# Patient Record
Sex: Female | Born: 1980 | Race: White | Hispanic: No | Marital: Married | Smoking: Current some day smoker
Health system: Southern US, Community
[De-identification: ages and names within clinical notes are randomized; demographics above are authoritative.]

## PROBLEM LIST (undated history)

## (undated) DIAGNOSIS — F32A Depression, unspecified: Secondary | ICD-10-CM

## (undated) DIAGNOSIS — G43909 Migraine, unspecified, not intractable, without status migrainosus: Secondary | ICD-10-CM

## (undated) DIAGNOSIS — M549 Dorsalgia, unspecified: Secondary | ICD-10-CM

## (undated) DIAGNOSIS — F3160 Bipolar disorder, current episode mixed, unspecified: Secondary | ICD-10-CM

## (undated) DIAGNOSIS — F633 Trichotillomania: Secondary | ICD-10-CM

## (undated) DIAGNOSIS — R87613 High grade squamous intraepithelial lesion on cytologic smear of cervix (HGSIL): Secondary | ICD-10-CM

## (undated) DIAGNOSIS — M797 Fibromyalgia: Secondary | ICD-10-CM

## (undated) DIAGNOSIS — F329 Major depressive disorder, single episode, unspecified: Secondary | ICD-10-CM

## (undated) HISTORY — DX: Bipolar disorder, current episode mixed, unspecified: F31.60

## (undated) HISTORY — DX: Migraine, unspecified, not intractable, without status migrainosus: G43.909

## (undated) HISTORY — PX: BACK SURGERY: SHX140

## (undated) HISTORY — DX: High grade squamous intraepithelial lesion on cytologic smear of cervix (HGSIL): R87.613

## (undated) HISTORY — DX: Dorsalgia, unspecified: M54.9

## (undated) HISTORY — DX: Depression, unspecified: F32.A

## (undated) HISTORY — PX: TUBAL LIGATION: SHX77

## (undated) HISTORY — PX: CHOLECYSTECTOMY: SHX55

## (undated) HISTORY — DX: Fibromyalgia: M79.7

## (undated) HISTORY — DX: Major depressive disorder, single episode, unspecified: F32.9

---

## 2001-12-21 ENCOUNTER — Other Ambulatory Visit: Admission: RE | Admit: 2001-12-21 | Discharge: 2001-12-21 | Payer: Self-pay | Admitting: Internal Medicine

## 2003-02-16 ENCOUNTER — Ambulatory Visit (HOSPITAL_COMMUNITY): Admission: RE | Admit: 2003-02-16 | Discharge: 2003-02-16 | Payer: Self-pay | Admitting: *Deleted

## 2003-02-16 ENCOUNTER — Encounter: Payer: Self-pay | Admitting: *Deleted

## 2003-05-12 ENCOUNTER — Encounter: Payer: Self-pay | Admitting: *Deleted

## 2003-05-12 ENCOUNTER — Ambulatory Visit (HOSPITAL_COMMUNITY): Admission: RE | Admit: 2003-05-12 | Discharge: 2003-05-12 | Payer: Self-pay | Admitting: *Deleted

## 2003-07-07 ENCOUNTER — Ambulatory Visit (HOSPITAL_COMMUNITY): Admission: AD | Admit: 2003-07-07 | Discharge: 2003-07-07 | Payer: Self-pay | Admitting: *Deleted

## 2003-07-12 ENCOUNTER — Inpatient Hospital Stay (HOSPITAL_COMMUNITY): Admission: AD | Admit: 2003-07-12 | Discharge: 2003-07-14 | Payer: Self-pay | Admitting: *Deleted

## 2005-12-09 ENCOUNTER — Ambulatory Visit (HOSPITAL_COMMUNITY): Admission: RE | Admit: 2005-12-09 | Discharge: 2005-12-09 | Payer: Self-pay | Admitting: *Deleted

## 2006-02-11 ENCOUNTER — Ambulatory Visit (HOSPITAL_COMMUNITY): Admission: RE | Admit: 2006-02-11 | Discharge: 2006-02-11 | Payer: Self-pay | Admitting: *Deleted

## 2007-12-06 ENCOUNTER — Ambulatory Visit (HOSPITAL_COMMUNITY): Admission: RE | Admit: 2007-12-06 | Discharge: 2007-12-06 | Payer: Self-pay | Admitting: Family Medicine

## 2007-12-08 ENCOUNTER — Ambulatory Visit (HOSPITAL_COMMUNITY): Admission: RE | Admit: 2007-12-08 | Discharge: 2007-12-08 | Payer: Self-pay | Admitting: General Surgery

## 2007-12-08 ENCOUNTER — Encounter (INDEPENDENT_AMBULATORY_CARE_PROVIDER_SITE_OTHER): Payer: Self-pay | Admitting: General Surgery

## 2008-01-13 ENCOUNTER — Ambulatory Visit: Payer: Self-pay | Admitting: Gastroenterology

## 2010-01-30 ENCOUNTER — Other Ambulatory Visit: Admission: RE | Admit: 2010-01-30 | Discharge: 2010-01-30 | Payer: Self-pay | Admitting: Obstetrics and Gynecology

## 2010-08-25 ENCOUNTER — Inpatient Hospital Stay (HOSPITAL_COMMUNITY): Admission: AD | Admit: 2010-08-25 | Discharge: 2010-08-27 | Payer: Self-pay | Admitting: Obstetrics and Gynecology

## 2010-11-10 ENCOUNTER — Encounter: Payer: Self-pay | Admitting: Family Medicine

## 2010-12-31 LAB — CBC
HCT: 36.8 % (ref 36.0–46.0)
Hemoglobin: 12.4 g/dL (ref 12.0–15.0)
MCHC: 33.8 g/dL (ref 30.0–36.0)
RDW: 14.4 % (ref 11.5–15.5)
WBC: 8.6 10*3/uL (ref 4.0–10.5)

## 2010-12-31 LAB — RPR: RPR Ser Ql: NONREACTIVE

## 2011-03-04 NOTE — Op Note (Signed)
NAMECARMYN, [name] NO.:  [id number]   MEDICAL RECORD NO.:  [id number]          PATIENT TYPE:  AMB   LOCATION:  DAY                           FACILITY:  [facility]   PHYSICIAN:  Dalia Heading, M.D.  DATE OF BIRTH:  Apr 14, 1981   DATE OF PROCEDURE:  12/08/2007  DATE OF DISCHARGE:                               OPERATIVE REPORT   PREOPERATIVE DIAGNOSIS:  Cholecystitis, cholelithiasis, hepatitis.   POSTOPERATIVE DIAGNOSIS:  Cholecystitis, cholelithiasis, hepatitis.   PROCEDURE:  Laparoscopic cholecystectomy with cholangiograms, liver  biopsy.   SURGEON:  Dr. Franky Macho.   ANESTHESIA:  General endotracheal.   INDICATIONS:  The patient is a 30 year old white female who presents  with both cholecystitis secondary to cholelithiasis as well as newly  diagnosed hepatitis C positive antigen.  The patient now comes to the  operating for laparoscopic cholecystectomy with cholangiograms as well  as a liver biopsy.  The risks and benefits of the procedures including  bleeding, infection, hepatobiliary injury, and the possibility of an  open procedure were fully explained to the patient, who gave informed  consent.   PROCEDURE NOTE:  The patient was placed in supine position.  After  induction of general endotracheal anesthesia, the abdomen was prepped  and draped in the usual sterile technique with Betadine.  Surgical site  confirmation was performed.   An infraumbilical incision was made down to the fascia.  A Veress needle  was introduced into the abdominal cavity and confirmation of placement  was done using the saline drop test.  The abdomen was then insufflated  to 16 mmHg pressure.  An 11 mm trocar was introduced into the abdominal  cavity under direct visualization without difficulty.  The patient was  placed in reversed Trendelenburg position.  Additional 11-mm trocar was  placed in the epigastric region and 5-mm trocars placed in the right  upper quadrant and  right flank regions.  The liver was inspected, noted  to be within normal limits.  A Tru-Cut core needle biopsy of the right  lobe of liver was obtained and sent to pathology for further  examination.  The puncture site was cauterized using Bovie  electrocautery on the liver.  The gallbladder was retracted superior and  laterally.  The dissection was begun around the infundibulum of the  gallbladder.  The cystic duct was first identified.  Its juncture to the  infundibulum fully identified.  A single Endo clip was placed proximally  on the cystic duct.  Incision was made into the cystic duct and a  cholangiocatheter was inserted.  Under digital fluoroscopy,  cholangiograms were performed.  The hepatobiliary tree was within normal  limits.  No dilatation or intrahepatic defects were noted.  The dye  flowed freely into the duodenum.  The system was flushed with normal  saline.  The cholangiocatheter removed.  Multiple Endo clips placed  distally on the cystic duct and cystic duct was divided.  The cystic  artery was likewise ligated and divided.  Gallbladder was freed away  from the gallbladder fossa using Bovie electrocautery.  The gallbladder  delivered through the epigastric trocar site using EndoCatch bag.  Gallbladder fossa was inspected.  No abnormal bleeding or bile leakage  was noted.  Surgicel was placed in the gallbladder fossa.  All fluid and  air were then evacuated from the abdominal cavity prior to removal of  the trocars.   All wounds were irrigated normal saline.  All wounds were checked with  0.5% Sensorcaine.  The infraumbilical fascia was reapproximated using a  0-0 Vicryl interrupted suture.  All skin incisions were closed using  staples.  Betadine ointment and dry sterile dressings were applied.   All tape and needle counts were correct at the end of the procedure.  The patient was extubated in the operating room and went back to  recovery room awake in stable  condition.   COMPLICATIONS:  None.   SPECIMEN:  1. Gallbladder.  2. Liver biopsy.   BLOOD LOSS:  Minimal.      Dalia Heading, M.D.  Electronically Signed     MAJ/MEDQ  D:  12/08/2007  T:  12/09/2007  Job:  [id number]   cc:   Kirk Ruths, M.D.  Fax: [phone]

## 2011-03-04 NOTE — H&P (Signed)
NAMEGENEVIVE, PRINTUP NO.:  [id number]   MEDICAL RECORD NO.:  [id number]          PATIENT TYPE:  AMB   LOCATION:  DAY                           FACILITY:  [facility]   PHYSICIAN:  Dalia Heading, M.D.  DATE OF BIRTH:  [birth year]   DATE OF ADMISSION:  12/06/2007  DATE OF DISCHARGE:  LH                              HISTORY & PHYSICAL   CHIEF COMPLAINT:  Cholecystitis, cholelithiasis.   HISTORY OF PRESENT ILLNESS:  The patient is a 30 year old white female  who is referred for evaluation and treatment of biliary colic secondary  to cholelithiasis.  She has been having intermittent right upper  quadrant abdominal pain radiating to the flank, nausea, and indigestion  for several weeks.  This is made worse with fatty foods.  No fever,  chills, jaundice have been noted.   PAST MEDICAL HISTORY:  Unremarkable.   PAST SURGICAL HISTORY:  Unremarkable.   CURRENT MEDICATIONS:  None.   ALLERGIES:  No known drug allergies.   REVIEW OF SYSTEMS:  Noncontributory.   PHYSICAL EXAMINATION:  GENERAL:  The patient is a well-developed, well-  nourished white female in no acute distress.  HEENT:  Examination reveals no scleral icterus.  LUNGS:  Clear to auscultation with equal breath sounds bilaterally.  HEART:  Examination reveals regular rate and rhythm without S3, S4, or  murmurs.  ABDOMEN:  Soft and nondistended.  She is tender in the right upper  quadrant to palpation.  No hepatosplenomegaly, masses, hernias are  identified.   Ultrasound of the gallbladder reveals cholelithiasis.   LFTs are abnormal with an alkaline phosphatase of 219, SGOT 104, SGPT  115, total bilirubin 0.6.   IMPRESSION:  Cholecystitis, cholelithiasis.   PLAN:  The patient is scheduled for laparoscopic cholecystectomy with  cholangiograms on December 08, 2007.  Risks and benefits of the  procedure including bleeding, infection, hepatobiliary injury, the  possibly of an open procedure were fully  explained to the patient, gave  informed consent.      Dalia Heading, M.D.  Electronically Signed     MAJ/MEDQ  D:  12/07/2007  T:  12/07/2007  Job:  [id number]   cc:   [facility]  Fax: [phone]   Kirk Ruths, M.D.  Fax: [phone]

## 2011-03-07 NOTE — Discharge Summary (Signed)
   [name]                         ACCOUNT NO.:  [id number]   MEDICAL RECORD NO.:  [id number]                   PATIENT TYPE:  INP   LOCATION:  A428                                 FACILITY:  [facility]   PHYSICIAN:  Langley Gauss, M.D.                DATE OF BIRTH:  Feb 03, 1981   DATE OF ADMISSION:  07/12/2003  DATE OF DISCHARGE:  07/14/2003                                 DISCHARGE SUMMARY   DIAGNOSES:  Term pregnancy, delivered.   PROCEDURES:  On July 12, 2003:  1. Placement of continuous lumbar epidural analgesia.  2. Vaginal delivery.   DISPOSITION:  At the time of discharge the patient is given Depo-Provera 250  mg IM.  She also is given a prescription for Tylenol No. 3 with no refill.  She is breast feeding.  She will follow up in the office in 4-6 weeks time  for postpartum evaluation.   PERTINENT LABORATORY STUDIES:  Include O positive blood type.  Admission  hemoglobin and hematocrit 11.4/33.6 with a white count of 9.1.  Postpartum  day 1 hemoglobin 10.8/hematocrit 32.2.   HOSPITAL COURSE:  See previous dictations.  The patient delivered in a well-  controlled manner on July 12, 2003.  Postpartum the patient did well,  bonded well with the infant.  With the assistance of the nursing staff the  infant did begin latching on and breast feeding well.  Thus, both mother and  infant were discharged to home on postpartum day 1-1/2.     ___________________________________________                                         Langley Gauss, M.D.   DC/MEDQ  D:  07/18/2003  T:  07/18/2003  Job:  [id number]

## 2011-03-07 NOTE — Op Note (Signed)
[name]                         ACCOUNT NO.:  [id number]   MEDICAL RECORD NO.:  [id number]                   PATIENT TYPE:  INP   LOCATION:  A428                                 FACILITY:  [facility]   PHYSICIAN:  Langley Gauss, M.D.                DATE OF BIRTH:  14-Nov-1980   DATE OF PROCEDURE:  07/12/2003  DATE OF DISCHARGE:                                 OPERATIVE REPORT   PREOPERATIVE DIAGNOSIS:  A 39-week intrauterine pregnancy for induction.   PROCEDURE:  1. Placement of continuous lumbar epidural analgesia.  2. Spontaneous-assisted vaginal delivery of a 6 pound 12 ounce female     infant.  3. Midline episiotomy repair.   SURGEON:  Langley Gauss, M.D.   ESTIMATED BLOOD LOSS:  Less than 500 cc.   COMPLICATIONS:  None.   SPECIMENS:  Arterial cord gas and cord blood to pathology.  The placenta was  examined and noted to be apparently intact with a three-vessel umbilical  cord.   SUMMARY:  The patient progressed rapidly along the labor curve, with an  excellent epidural block in place.  With reaching complete dilatation, she  began having pelvic pressure and was encouraged to push.  She was placed in  the dorsal lithotomy position and prepped and draped in the usual sterile  manner.  The Foley catheter was removed.  The patient pushed well during the  short second stage of labor with descent of the vertex to the perineal  floor.  A total of 30 cc of 1% lidocaine plain was injected in the midline  of the perineal body.  With extension of the perineum, a small midline  episiotomy was performed.  The infant was then delivered in a direct OA  position over this midline episiotomy without extension.  Mouth and nares  were bulb suctioned of clear amniotic fluid.  Renewed expulsive efforts  resulted in spontaneous rotation to a right anterior shoulder position.  Gentle downward traction combined with expulsive efforts resulted in  delivery of the shoulder beneath  the pubic symphysis without difficulty.  Spontaneous and vigorous breathe and cry was noted.  The umbilical cord was  milked towards the infant.  The cord was doubly clamped and cut.  The infant  was placed on the maternal abdomen for immediate bonding purposes.  Arterial  cord gas and cord blood were then obtained from the umbilical cord.  Gentle  traction on the umbilical cord resulted in separation which upon examination  appeared to be an intact placenta with associated three-vessel umbilical  cord.  Uterine tone was adequate by performing bimanual fundal massage.  Examination of the genital tract revealed no lacerations.  The midline  episiotomy was not extended.  This was easily repaired utilizing 0 chromic  in a running locked fashion on the vaginal mucosa followed by a continuous  layer of 2-0 Vicryl on the muscular layer of the perineal  body followed by  continuous superficial 0 chromic running suture on the perineal body itself.   The patient tolerated the delivery very well.  She was taken out of the  dorsal lithotomy position.  After bonding with her infant, she s rolled to  her side at which time the epidural catheter was removed with the blue tip  noted to be intact.      ___________________________________________                                            Langley Gauss, M.D.   DC/MEDQ  D:  07/12/2003  T:  07/13/2003  Job:  [id number]

## 2011-03-07 NOTE — Group Therapy Note (Signed)
   NAME:  [name]                         ACCOUNT NO.:  [id number]   MEDICAL RECORD NO.:  [id number]                   PATIENT TYPE:  INP   LOCATION:  LDR1                                 FACILITY:  [facility]   PHYSICIAN:  Langley Gauss, M.D.                DATE OF BIRTH:  [birth year]   DATE OF PROCEDURE:  07/12/2003  DATE OF DISCHARGE:                                   PROGRESS NOTE   HISTORY OF PRESENT ILLNESS:  The patient was admitted for induction of  labor.  External fetal monitor revealed a reassuring fetal heart rate, with  fetal heart rate baseline of 150s.  No decelerations were noted,  accelerations were noted.  There was normal longterm variability.  No  uterine activity was identified.  The patient did state that she had some  mild uterine contractions after an examination yesterday in the office, but  other than that, no significant change in cervical mucous, no leakage of  fluid, no vaginal discharge or bleeding.   On examination, the cervix was 2 cm dilated, 70% effaced, with the vertex at  0 station.  The cervix was somewhat posterior.  A fetal scalp electrode was  placed with resultant amniotomy and the findings of clear amniotic fluid.   ASSESSMENT AND PLAN:  The patient is GBS negative.  She would desire  epidural with onset of active labor.  Will manage expectantly x1 hour for  onset of labor.  Thereafter, augment or induce with Pitocin as clinically  indicated.      ___________________________________________                                            Langley Gauss, M.D.   DC/MEDQ  D:  07/12/2003  T:  07/12/2003  Job:  [id number]

## 2011-03-07 NOTE — H&P (Signed)
NAME:  [name]                         ACCOUNT NO.:  [id number]   MEDICAL RECORD NO.:  [id number]                   PATIENT TYPE:  INP   LOCATION:  LDR1                                 FACILITY:  [facility]   PHYSICIAN:  Langley Gauss, M.D.                DATE OF BIRTH:  10-21-1980   DATE OF ADMISSION:  07/12/2003  DATE OF DISCHARGE:                                HISTORY & PHYSICAL   This is a 30 year old gravida 1, para 0 at 39+ weeks gestation who is  admitted for induction of labor, secondary to psychosocial factors.  In  addition, the patient has been having a prodromal symptoms of labor x 2 days  duration.  The patient's prenatal course uncomplicated.  She does have a one  hour GTT normal at 85.  She is noted to be O positive blood type.  Negative  antibody screen.  AFP triple screen within normal limits.  Group B strep  carrier status noted to be negative, tested June 22, 2003.  The patient  has had an 18-week ultrasound which documents a normal anatomic survey.  She  has displayed good growth of her fundal height.  She has experienced a  weight gain from 122-167, during the pregnancy.   PAST MEDICAL HISTORY:  She has no known drug allergies.   CURRENT MEDICATIONS:  Prenatal vitamins.   The patient quit smoking with the positive pregnancy test.   She has no other medical or surgical history.  She did receive Depo-Provera  11/2001.  However, her menstrual periods had become regular and predictable  by May 27, 2002.   SOCIAL HISTORY:  The father of the baby works for [employer].  She  previously was employed at [employer].  She ceased employment during this  pregnancy.   PHYSICAL EXAMINATION:  GENERAL:  A white female in no acute distress.  VITAL SIGNS:  Height 5 foot 5, pre-pregnancy 122, most recent 167, 126/84,  pulse rate of 80, respiratory rate is 20.  HEENT:  Negative.  No adenopathy.  NECK:  Supple.  Thyroid is not palpable.  The mucous  membranes are moist.  LUNGS:  Clear.  CARDIOVASCULAR:  Regular, rate and rhythm.  ABDOMEN:  Soft and nontender.  No surgical scars are identified.  She is  vertex presentation by Leopold's maneuvers.  EXTREMITIES:  Noted to be normal.  PELVIC:  Normal external genitalia.  No lesions or ulcerations identified.  Cervix noted to be 2-cm dilated, 80% effaced, vertex at a 0 station.  Cervix  is noted to be posterior.  Vertex is well applied to the cervix with no  palpable membranes.  Fetal heart tones are auscultated in the 150s.  Fundal  height is 35-cm.  The patient did experience engagement of the vertex  sensitive pelvis during this past week.   ASSESSMENT:  A 39+ week intrauterine pregnancy with favorable cervix.   The patient  desires induction at this time.  We will proceed with amniotomy  and augment her induce as clinically indicated.  The patient does desire  epidural for relief with onset of active labor.     ___________________________________________                                         Langley Gauss, M.D.   DC/MEDQ  D:  07/11/2003  T:  07/11/2003  Job:  [id number]

## 2011-03-07 NOTE — Op Note (Signed)
[name]                         ACCOUNT NO.:  [id number]   MEDICAL RECORD NO.:  [id number]                   PATIENT TYPE:  INP   LOCATION:  A428                                 FACILITY:  [facility]   PHYSICIAN:  Langley Gauss, M.D.                DATE OF BIRTH:  03/23/1981   DATE OF PROCEDURE:  07/12/2003  DATE OF DISCHARGE:                                 OPERATIVE REPORT   PROCEDURE:  Placement of continuous lumbar epidural analgesia at the L2-L3  interspace.   SURGEON:  Langley Gauss, M.D.   DESCRIPTION OF PROCEDURE:  Appropriate informed consent was obtained.  Continuous electronic fetal monitoring revealed a reassuring fetal heart  rate.  The patient was placed in the seated position.  Bony landmarks were  identified.  The patient's back was sterilely prepped and draped utilizing  the epidural kit.  The L2-L3 interspace was chosen, and 5 cc of 1% lidocaine  plain was injected in the midline to raise a small skin wheal.  The 17-gauge  Tuohy-Schliff needle was then utilized with loss of resistance and air  filled glass syringe to identify entry into the epidural space.  On the  first attempt, the bony spinous process was encountered, so the epidural  needle was withdrawn and directed slightly caudad.  With the second attempt,  there was excellent loss of resistance consistent with entry into the  epidural space.  Initial test dose of 5 cc of 1.5% lidocaine plus  epinephrine was injected through the epidural needle.  No signs of CSF,  intravascular injection, or pain.  The epidural catheter was inserted to a  depth of 5 cm.  The epidural needle was removed.  Aspiration test was  negative.  A second test dose of 2 cc of 1.5% lidocaine plus epinephrine was  injected through the epidural catheter.  Again, no signs of  CSF,  intravascular injection, or pain.  The catheter was secured into place.  The  patient was returned to the lateral supine position at which time she  had  tingling in the feet bilateral consistent with an appropriate epidural  block.  She was then started on the infusion pump containing the standard  mixture.  She will be treated with a bolus of 10 cc followed by a continuous  infusion rate of 14 cc per hour.   At the completion of the procedure, the patient did have evidence of an  adequate block.  She was examined and noted to be 6 cm dilated with  continued reassuring fetal heart rate.      ___________________________________________                                            Langley Gauss, M.D.   DC/MEDQ  D:  07/12/2003  T:  07/13/2003  Job:  [id number]

## 2011-04-30 ENCOUNTER — Other Ambulatory Visit: Payer: Self-pay | Admitting: Dermatology

## 2011-06-05 ENCOUNTER — Other Ambulatory Visit: Payer: Self-pay | Admitting: Dermatology

## 2011-07-11 LAB — HCG, QUANTITATIVE, PREGNANCY: hCG, Beta Chain, Quant, S: <2

## 2011-10-21 DIAGNOSIS — R87613 High grade squamous intraepithelial lesion on cytologic smear of cervix (HGSIL): Secondary | ICD-10-CM

## 2011-10-21 HISTORY — DX: High grade squamous intraepithelial lesion on cytologic smear of cervix (HGSIL): R87.613

## 2012-08-17 ENCOUNTER — Ambulatory Visit (HOSPITAL_COMMUNITY)
Admission: RE | Admit: 2012-08-17 | Discharge: 2012-08-17 | Disposition: A | Discharge status: Home / Self Care | Payer: 59 | Source: Ambulatory Visit | Attending: Family Medicine | Admitting: Family Medicine

## 2012-08-17 ENCOUNTER — Encounter: Payer: Self-pay | Admitting: Family Medicine

## 2012-08-17 ENCOUNTER — Ambulatory Visit (INDEPENDENT_AMBULATORY_CARE_PROVIDER_SITE_OTHER): Payer: 59 | Admitting: Family Medicine

## 2012-08-17 VITALS — BP 100/70 | HR 98 | Resp 15 | Ht 64.5 in | Wt 130.4 lb

## 2012-08-17 DIAGNOSIS — M549 Dorsalgia, unspecified: Secondary | ICD-10-CM

## 2012-08-17 DIAGNOSIS — F32A Depression, unspecified: Secondary | ICD-10-CM

## 2012-08-17 DIAGNOSIS — F329 Major depressive disorder, single episode, unspecified: Secondary | ICD-10-CM

## 2012-08-17 DIAGNOSIS — M545 Low back pain, unspecified: Secondary | ICD-10-CM | POA: Insufficient documentation

## 2012-08-17 DIAGNOSIS — M5126 Other intervertebral disc displacement, lumbar region: Secondary | ICD-10-CM | POA: Insufficient documentation

## 2012-08-17 DIAGNOSIS — G8929 Other chronic pain: Secondary | ICD-10-CM

## 2012-08-17 DIAGNOSIS — F3289 Other specified depressive episodes: Secondary | ICD-10-CM

## 2012-08-17 DIAGNOSIS — M546 Pain in thoracic spine: Secondary | ICD-10-CM | POA: Insufficient documentation

## 2012-08-17 DIAGNOSIS — M5136 Other intervertebral disc degeneration, lumbar region: Secondary | ICD-10-CM | POA: Insufficient documentation

## 2012-08-17 MED ORDER — HYDROCODONE-ACETAMINOPHEN 5-500 MG PO TABS: 1.0000 | ORAL_TABLET | ORAL | Status: DC | PRN

## 2012-08-17 MED ORDER — KETOROLAC TROMETHAMINE 60 MG/2ML IJ SOLN
60.0000 mg | Freq: Once | INTRAMUSCULAR | Status: AC
  Administered 2012-08-17: 60 mg via INTRAMUSCULAR

## 2012-08-17 MED ORDER — METHYLPREDNISOLONE 4 MG PO KIT: PACK | ORAL | Status: DC

## 2012-08-17 NOTE — Patient Instructions (Signed)
Xrays of back to be done Start prednisone dose pak  Vicodin for severe pain Soma at bedtime  MRI will be set up as well  Records to be obtained  F/U 6 months for mood

## 2012-08-17 NOTE — Assessment & Plan Note (Signed)
xrays to be done, concern for nerve impingement with radicular symptoms, which have become more debilitating. She has been on NSAIDS, muscle relaxant, add low dose narcotic Xrays show loss of disc height and narrowing, early degenerative changes MRI to be done

## 2012-08-17 NOTE — Progress Notes (Signed)
  Subjective:    Patient ID: [name], female    DOB: Oct 22, 1980, 31 y.o.   MRN: [id number]  HPI Patient here to establish care. Previous PCP Dr. Lysbeth Galas. [facility] Medications and history reviewed Back pain-patient's history of chronic back pain since 2007 after birth of her child. She's had acute flares on and off for the past few years. She never had imaging secondary to insurance coverage. Over the past few months her back is worsened to the point where she is unable to stand or sit for long periods of time. She often has to walk hunched over to relieve pain. She was evaluated by her previous PCP recently who started her on soma muscle relaxant as well as Aleve neither has helped very much. Back pain described as sharp pain in the mid to lower back with radiation down bilateral legs. She has more pain on the left side compared to right. She denies tingling or numbness in her feet. Denies any change in bowel or bladder. Depression-patient has a long-standing history of depression and anxiety. She's been on medications for many years she's currently stable on Prozac and Wellbutrin.    Review of Systems  GEN- denies fatigue, fever, weight loss,weakness, recent illness HEENT- denies eye drainage, change in vision, nasal discharge, CVS- denies chest pain, palpitations RESP- denies SOB, cough, wheeze ABD- denies N/V, change in stools, abd pain GU- denies dysuria, hematuria, dribbling, incontinence MSK- + joint pain, muscle aches, injury Neuro- denies headache, dizziness, syncope, seizure activity      Objective:   Physical Exam GEN- NAD, alert and oriented x3, uncomfortable appearing HEENT- PERRL, EOMI, non injected sclera, pink conjunctiva, MMM, oropharynx clear Neck- Supple,normal ROM CVS- RRR, no murmur RESP-CTAB ABD-NABS,soft,NT,ND Back- TTP lumbar spine, minimal spasm, able to walk on toes and heels, decreased ROM, + SLR left side Neuro-  CNII-XII in tact, bilateral Reflexes 2 + equal, tone normal, sensation grossly in tact , antalgic gait EXT- No edema Pulses- Radial, DP- 2+ Psych-normal affect and Mood       Assessment & Plan:

## 2012-08-17 NOTE — Assessment & Plan Note (Signed)
Controlled on medications Records to be obtained

## 2012-08-23 ENCOUNTER — Ambulatory Visit (HOSPITAL_COMMUNITY)
Admission: RE | Admit: 2012-08-23 | Discharge: 2012-08-23 | Disposition: A | Discharge status: Home / Self Care | Payer: 59 | Source: Ambulatory Visit | Attending: Family Medicine | Admitting: Family Medicine

## 2012-08-23 DIAGNOSIS — M5126 Other intervertebral disc displacement, lumbar region: Secondary | ICD-10-CM | POA: Insufficient documentation

## 2012-08-23 DIAGNOSIS — M545 Low back pain, unspecified: Secondary | ICD-10-CM | POA: Insufficient documentation

## 2012-08-23 DIAGNOSIS — M549 Dorsalgia, unspecified: Secondary | ICD-10-CM

## 2012-09-30 ENCOUNTER — Ambulatory Visit (INDEPENDENT_AMBULATORY_CARE_PROVIDER_SITE_OTHER): Payer: 59 | Admitting: Family Medicine

## 2012-09-30 ENCOUNTER — Encounter: Payer: Self-pay | Admitting: Family Medicine

## 2012-09-30 VITALS — BP 93/71 | HR 85 | Resp 16 | Ht 64.5 in | Wt 128.1 lb

## 2012-09-30 DIAGNOSIS — N3090 Cystitis, unspecified without hematuria: Secondary | ICD-10-CM

## 2012-09-30 DIAGNOSIS — N390 Urinary tract infection, site not specified: Secondary | ICD-10-CM

## 2012-09-30 LAB — POCT URINALYSIS DIPSTICK
Glucose, UA: NEGATIVE
Nitrite, UA: NEGATIVE
Urobilinogen, UA: 0.2
pH, UA: 5.5

## 2012-09-30 MED ORDER — CEPHALEXIN 500 MG PO CAPS: 500.0000 mg | ORAL_CAPSULE | Freq: Two times a day (BID) | ORAL | Status: AC

## 2012-09-30 MED ORDER — PHENAZOPYRIDINE HCL 100 MG PO TABS: 100.0000 mg | ORAL_TABLET | Freq: Three times a day (TID) | ORAL | Status: DC | PRN

## 2012-09-30 NOTE — Assessment & Plan Note (Signed)
Will treat for acute cystitis with keflex, urine sent for culture to see what kind of pathogen we are dealing with, she may have only halfway treated previous UTI with the cirpo, though recurrent monthly symptoms are concerning. ? Interstitial cystitis picture. Discussed limiting caffiene, increase water, add pyridium

## 2012-09-30 NOTE — Patient Instructions (Signed)
Antibiotics sent We will notify you if antibiotics need to be changed Pyridium for pressure and pain for 3 days F/U as needed

## 2012-09-30 NOTE — Progress Notes (Signed)
  Subjective:    Patient ID: [name], female    DOB: 25-May-1981, 31 y.o.   MRN: [id number]  HPI  Pt here with UTI symptoms past past 3 days.  Feels like she can't empty bladder completely +dysuria + urinary frequency,  + hematuria at end of urine stream,. Dysuria associated with sharp pains in left side. Has symptoms of UTI about 2-3 times a month, but rarely seen in office for them, sister also has frequent UTI, has mild stress incontinence since birth of children . Denies fever, constipation, N/V Note last month took 5 days of left over cipro   Review of Systems - per above   GEN- denies fatigue, fever, weight loss,weakness, recent illness CVS- denies chest pain, palpitations RESP- denies SOB, cough, wheeze ABD- denies N/V, change in stools, abd pain GU- + dysuria,+ hematuria, dribbling, incontinence, denies vaginal discharge        Objective:   Physical Exam GEN- NAD, alert and oriented x3 CVS- RRR, no murmur RESP-CTAB ABD-NABS,soft,NT,ND, no suprapubic tenderness, no CVA tenderness EXT- No edema Pulses- Radial 2+        Assessment & Plan:

## 2012-11-15 ENCOUNTER — Ambulatory Visit (INDEPENDENT_AMBULATORY_CARE_PROVIDER_SITE_OTHER): Payer: 59 | Admitting: Family Medicine

## 2012-11-15 ENCOUNTER — Encounter: Payer: Self-pay | Admitting: Family Medicine

## 2012-11-15 VITALS — BP 90/68 | HR 96 | Temp 99.3°F | Resp 18 | Ht 64.5 in | Wt 126.0 lb

## 2012-11-15 DIAGNOSIS — K2970 Gastritis, unspecified, without bleeding: Secondary | ICD-10-CM | POA: Insufficient documentation

## 2012-11-15 DIAGNOSIS — K5289 Other specified noninfective gastroenteritis and colitis: Secondary | ICD-10-CM

## 2012-11-15 DIAGNOSIS — K529 Noninfective gastroenteritis and colitis, unspecified: Secondary | ICD-10-CM

## 2012-11-15 MED ORDER — PROMETHAZINE HCL 12.5 MG PO TABS: 12.5000 mg | ORAL_TABLET | Freq: Four times a day (QID) | ORAL | Status: DC | PRN

## 2012-11-15 MED ORDER — DICYCLOMINE HCL 10 MG PO CAPS: 10.0000 mg | ORAL_CAPSULE | Freq: Four times a day (QID) | ORAL | Status: DC

## 2012-11-15 NOTE — Progress Notes (Signed)
  Subjective:    Patient ID: [name], female    DOB: 26-Jan-1981, 32 y.o.   MRN: [id number]  HPI  Patient presents with three-day history of diarrhea and nausea. She also noted fever last night. Saturday evening she began to feel sick and when she began having diarrhea diarrhea is a yellow watery stool no blood in the stool. She has severe cramping throughout the day he before she has a bowel movement. No sick contacts with family. She denies emesis she's not taking any medications and has not been tolerating any food only drinking small sips of liquids. She denies any UTI symptoms  Review of Systems   GEN- + fatigue, +fever, weight loss,weakness, recent illness HEENT- denies eye drainage, change in vision, nasal discharge, CVS- denies chest pain, palpitations RESP- denies SOB, cough, wheeze ABD- denies N/V, +change in stools, +abd pain GU- denies dysuria, hematuria, dribbling, incontinence       Objective:   Physical Exam GEN- NAD, alert and oriented x3, febrile HEENT- PERRL, EOMI, non injected sclera, pink conjunctiva, Dry MM, oropharynx clear Neck- Supple,  CVS- RRR, no murmur RESP-CTAB ABD-NABS,soft,Mild TTP, no rebound, no guarding EXT- No edema Pulses- Radial 2+        Assessment & Plan:

## 2012-11-15 NOTE — Patient Instructions (Addendum)
Plenty of fluids  Immodium for diarrhea Phenergan for nausea  Bentyl for cramps  Call if not improved  F/U as needed

## 2012-11-15 NOTE — Assessment & Plan Note (Signed)
Chief her gastroenteritis. She will use Imodium for diarrhea. Her exam was benign with exception of low-grade fever. I will give her Bentyl for the severe spasms to be used acutely. She will also be given Phenergan for the nausea she is to try small sips of liquids and add in broth and very bland foods. She'll be sent home to rest for the next 24 hours, given work note as she has fever and diarrhea

## 2012-12-04 ENCOUNTER — Other Ambulatory Visit: Payer: Self-pay

## 2012-12-16 ENCOUNTER — Telehealth (INDEPENDENT_AMBULATORY_CARE_PROVIDER_SITE_OTHER): Payer: 59

## 2012-12-16 DIAGNOSIS — N390 Urinary tract infection, site not specified: Secondary | ICD-10-CM

## 2012-12-16 MED ORDER — CEPHALEXIN 500 MG PO CAPS: 500.0000 mg | ORAL_CAPSULE | Freq: Two times a day (BID) | ORAL | Status: AC

## 2012-12-16 NOTE — Telephone Encounter (Addendum)
Spoke with pt, continues to have monthly episodes of dysuria pelvic pain and flank pain. Urinalysis in the office shows positive nitrates small leukocytes and positive blood. She also noticed that she gets infections worse after she has sexual intercourse. I will go ahead and her back on the Keflex for a week I will set her up with urology to have her bladder evaluated

## 2012-12-16 NOTE — Telephone Encounter (Signed)
For the past couple days, frequency, left flank pain and dysuria. Thinks it may be related to sexual encounters. Does she need to see urology or be put of prophalaxtic antibiotic? UA abnormal

## 2012-12-16 NOTE — Addendum Note (Signed)
Addended by: Milinda Antis F on: 12/16/2012 05:05 PM   Modules accepted: Orders

## 2012-12-17 LAB — POCT URINALYSIS DIPSTICK
Bilirubin, UA: NEGATIVE
Glucose, UA: NEGATIVE
Spec Grav, UA: >=1.03
pH, UA: 6

## 2012-12-17 NOTE — Addendum Note (Signed)
Addended by: Kandis Fantasia B on: 12/17/2012 04:18 PM   Modules accepted: Orders

## 2012-12-18 ENCOUNTER — Telehealth: Payer: Self-pay | Admitting: Family Medicine

## 2012-12-18 ENCOUNTER — Encounter (HOSPITAL_COMMUNITY): Payer: Self-pay

## 2012-12-18 ENCOUNTER — Emergency Department (HOSPITAL_COMMUNITY)
Admission: EM | Admit: 2012-12-18 | Discharge: 2012-12-18 | Disposition: A | Discharge status: Home / Self Care | Payer: 59 | Attending: Emergency Medicine | Admitting: Emergency Medicine

## 2012-12-18 DIAGNOSIS — Z79899 Other long term (current) drug therapy: Secondary | ICD-10-CM | POA: Insufficient documentation

## 2012-12-18 DIAGNOSIS — F3289 Other specified depressive episodes: Secondary | ICD-10-CM | POA: Insufficient documentation

## 2012-12-18 DIAGNOSIS — Z87891 Personal history of nicotine dependence: Secondary | ICD-10-CM | POA: Insufficient documentation

## 2012-12-18 DIAGNOSIS — R34 Anuria and oliguria: Secondary | ICD-10-CM | POA: Insufficient documentation

## 2012-12-18 DIAGNOSIS — N390 Urinary tract infection, site not specified: Secondary | ICD-10-CM | POA: Insufficient documentation

## 2012-12-18 DIAGNOSIS — R300 Dysuria: Secondary | ICD-10-CM | POA: Insufficient documentation

## 2012-12-18 DIAGNOSIS — R112 Nausea with vomiting, unspecified: Secondary | ICD-10-CM | POA: Insufficient documentation

## 2012-12-18 DIAGNOSIS — R52 Pain, unspecified: Secondary | ICD-10-CM | POA: Insufficient documentation

## 2012-12-18 DIAGNOSIS — R1110 Vomiting, unspecified: Secondary | ICD-10-CM

## 2012-12-18 DIAGNOSIS — M545 Low back pain, unspecified: Secondary | ICD-10-CM | POA: Insufficient documentation

## 2012-12-18 DIAGNOSIS — F329 Major depressive disorder, single episode, unspecified: Secondary | ICD-10-CM | POA: Insufficient documentation

## 2012-12-18 DIAGNOSIS — IMO0001 Reserved for inherently not codable concepts without codable children: Secondary | ICD-10-CM | POA: Insufficient documentation

## 2012-12-18 DIAGNOSIS — Z3202 Encounter for pregnancy test, result negative: Secondary | ICD-10-CM | POA: Insufficient documentation

## 2012-12-18 DIAGNOSIS — R509 Fever, unspecified: Secondary | ICD-10-CM | POA: Insufficient documentation

## 2012-12-18 LAB — CBC WITH DIFFERENTIAL/PLATELET
Eosinophils Absolute: 0 10*3/uL (ref 0.0–0.7)
Eosinophils Relative: 0 % (ref 0–5)
HCT: 40.1 % (ref 36.0–46.0)
Hemoglobin: 13.5 g/dL (ref 12.0–15.0)
Lymphs Abs: 0.5 10*3/uL — ABNORMAL LOW (ref 0.7–4.0)
MCH: 27.7 pg (ref 26.0–34.0)
MCV: 82.3 fL (ref 78.0–100.0)
Monocytes Absolute: 0.8 10*3/uL (ref 0.1–1.0)
Monocytes Relative: 8 % (ref 3–12)
RBC: 4.87 MIL/uL (ref 3.87–5.11)

## 2012-12-18 LAB — URINALYSIS, ROUTINE W REFLEX MICROSCOPIC
Nitrite: NEGATIVE
Protein, ur: 30 mg/dL — AB
pH: 6 (ref 5.0–8.0)

## 2012-12-18 LAB — COMPREHENSIVE METABOLIC PANEL
Alkaline Phosphatase: 96 U/L (ref 39–117)
BUN: 11 mg/dL (ref 6–23)
Creatinine, Ser: 0.68 mg/dL (ref 0.50–1.10)
GFR calc Af Amer: >90 mL/min (ref >90)
Glucose, Bld: 114 mg/dL — ABNORMAL HIGH (ref 70–99)
Potassium: 3.6 mEq/L (ref 3.5–5.1)
Total Protein: 7.2 g/dL (ref 6.0–8.3)

## 2012-12-18 LAB — URINE MICROSCOPIC-ADD ON

## 2012-12-18 MED ORDER — DEXTROSE 5 % IV SOLN: 1.0000 g | Freq: Once | INTRAVENOUS | Status: AC

## 2012-12-18 MED ORDER — ONDANSETRON HCL 4 MG/2ML IJ SOLN
4.0000 mg | Freq: Once | INTRAMUSCULAR | Status: AC
  Administered 2012-12-18: 4 mg via INTRAVENOUS
  Filled 2012-12-18: qty 2

## 2012-12-18 MED ORDER — CEFTRIAXONE SODIUM 1 G IJ SOLR: 1.0000 g | Freq: Once | INTRAMUSCULAR | Status: DC

## 2012-12-18 MED ORDER — DEXTROSE 5 % IV SOLN
INTRAVENOUS | Status: AC
  Filled 2012-12-18: qty 10

## 2012-12-18 MED ORDER — SODIUM CHLORIDE 0.9 % IV BOLUS (SEPSIS)
1000.0000 mL | Freq: Once | INTRAVENOUS | Status: AC
  Administered 2012-12-18: 1000 mL via INTRAVENOUS

## 2012-12-18 MED ORDER — IBUPROFEN 800 MG PO TABS: 800.0000 mg | ORAL_TABLET | Freq: Three times a day (TID) | ORAL | Status: DC | PRN

## 2012-12-18 NOTE — ED Notes (Signed)
Complain of flu type symptoms since yesterday. States she is being treated for a UTI but cannot take her meds

## 2012-12-18 NOTE — ED Provider Notes (Signed)
History    This chart was scribed for Benny Lennert, MD by Charolett Bumpers, ED Scribe. The patient was seen in room APA10/APA10. Patient's care was started at 0826.   CSN: [id number]  Arrival date & time 12/18/12  7829   First MD Initiated Contact with Patient 12/18/12 [phone]      Chief Complaint  Patient presents with  . Emesis   [name] is a 32 y.o. female who presents to the Emergency Department complaining of gradually worsening emesis with associated nausea, fever, chills and generalized body aches since yesterday afternoon. She was dx with a UTI and started on antibiotics yesterday. She states that she took her first dose of Keflex and started getting nausea yesterday afternoon and has not had any doses since. She states that she had dysuria, decreased urination with voiding and lower back aches prior to being diagnosed. Her pain is now more generalized. She denies any cough or sore throat. Temperature here in ED is 101.7. She has a h/o chronic UTI. She denies any current nausea.   Patient is a 32 y.o. female presenting with vomiting. The history is provided by the patient. No language interpreter was used.  Emesis Severity:  Moderate Quality:  Bilious material Progression:  Worsening Chronicity:  New Associated symptoms: chills, fever and myalgias   Associated symptoms: no abdominal pain, no cough, no diarrhea, no headaches and no sore throat     Past Medical History  Diagnosis Date  . Back pain   . Depression   . HSIL on Pap smear of cervix 2013    GYN- [facility]    Past Surgical History  Procedure Laterality Date  . Cholecystectomy    . Tubal ligation      Family History  Problem Relation Age of Onset  . Cancer Mother     lung  . Heart disease Mother   . Cancer Father     prostate     History  Substance Use Topics  . Smoking status: Former Smoker -- 1.00 packs/day    Types: Cigarettes  . Smokeless tobacco: Not on file  . Alcohol Use:  Yes     Comment: socially only less than monthly    OB History   Grav Para Term Preterm Abortions TAB SAB Ect Mult Living                  Review of Systems  Constitutional: Positive for fever and chills. Negative for fatigue.  HENT: Negative for congestion, sore throat, sinus pressure and ear discharge.   Eyes: Negative for discharge.  Respiratory: Negative for cough and shortness of breath.   Cardiovascular: Negative for chest pain.  Gastrointestinal: Positive for nausea and vomiting. Negative for abdominal pain and diarrhea.  Genitourinary: Positive for dysuria and decreased urine volume. Negative for frequency and hematuria.  Musculoskeletal: Positive for myalgias. Negative for back pain.  Skin: Negative for rash.  Neurological: Negative for seizures and headaches.  Psychiatric/Behavioral: Negative for hallucinations and confusion.  All other systems reviewed and are negative.    Allergies  Review of patient's allergies indicates no known allergies.  Home Medications   Current Outpatient Rx  Name  Route  Sig  Dispense  Refill  . buPROPion (WELLBUTRIN SR) 150 MG 12 hr tablet   Oral   Take 150 mg by mouth 2 (two) times daily.         . cephALEXin (KEFLEX) 500 MG capsule   Oral   Take  1 capsule (500 mg total) by mouth 2 (two) times daily.   14 capsule   0   . EXPIRED: dicyclomine (BENTYL) 10 MG capsule   Oral   Take 1 capsule (10 mg total) by mouth 4 (four) times daily.   40 capsule   0   . FLUoxetine (PROZAC) 40 MG capsule   Oral   Take 40 mg by mouth daily.         . phenazopyridine (PYRIDIUM) 100 MG tablet   Oral   Take 1 tablet (100 mg total) by mouth 3 (three) times daily as needed for pain.   9 tablet   0   . promethazine (PHENERGAN) 12.5 MG tablet   Oral   Take 1 tablet (12.5 mg total) by mouth every 6 (six) hours as needed for nausea.   30 tablet   0     BP 118/74  Pulse 118  Temp(Src) 101.7 F (38.7 C) (Oral)  Resp 18  SpO2 96%   LMP 12/08/2012  Physical Exam  Nursing note and vitals reviewed. Constitutional: She is oriented to person, place, and time. She appears well-developed and well-nourished.  HENT:  Head: Normocephalic and atraumatic.  Eyes: Conjunctivae and EOM are normal. No scleral icterus.  Neck: Neck supple. No thyromegaly present.  Cardiovascular: Normal rate, regular rhythm and normal heart sounds.  Exam reveals no gallop and no friction rub.   No murmur heard. Pulmonary/Chest: Effort normal and breath sounds normal. No stridor. No respiratory distress. She has no wheezes. She has no rales. She exhibits no tenderness.  Abdominal: Soft. Bowel sounds are normal. She exhibits no distension. There is no tenderness. There is no rebound.  Musculoskeletal: Normal range of motion. She exhibits tenderness. She exhibits no edema.  Minimal left flank tenderness.   Lymphadenopathy:    She has no cervical adenopathy.  Neurological: She is alert and oriented to person, place, and time. Coordination normal.  Skin: No rash noted. No erythema.  Psychiatric: She has a normal mood and affect. Her behavior is normal.    ED Course  Procedures (including critical care time)  DIAGNOSTIC STUDIES: Oxygen Saturation is 96% on room air, adequate by my interpretation.    COORDINATION OF CARE:  08:40-Discussed planned course of treatment with the patient including IV fluids, CBC with diff, CMP, pregnancy test and UA, who is agreeable at this time.   08:45-Medication Orders: Sodium chloride 0.9% bolus 1,000 mL-once   09:30-Recheck: Informed pt of lab results. IV antibiotics will be started.   09:45-Medication Orders: Ceftriaxone (Rocephin) 1 g in dextrose 5% 50 mL IVPB-once; Ondansetron (Zofran) injection 4 mg-once.   Results for orders placed during the hospital encounter of 12/18/12  CBC WITH DIFFERENTIAL      Result Value Range   WBC 10.0  4.0 - 10.5 K/uL   RBC 4.87  3.87 - 5.11 MIL/uL   Hemoglobin 13.5  12.0 -  15.0 g/dL   HCT 16.1  09.6 - 04.5 %   MCV 82.3  78.0 - 100.0 fL   MCH 27.7  26.0 - 34.0 pg   MCHC 33.7  30.0 - 36.0 g/dL   RDW 40.9  81.1 - 91.4 %   Platelets 142 (*) 150 - 400 K/uL   Neutrophils Relative 87 (*) 43 - 77 %   Neutro Abs 8.6 (*) 1.7 - 7.7 K/uL   Lymphocytes Relative 5 (*) 12 - 46 %   Lymphs Abs 0.5 (*) 0.7 - 4.0 K/uL   Monocytes Relative  8  3 - 12 %   Monocytes Absolute 0.8  0.1 - 1.0 K/uL   Eosinophils Relative 0  0 - 5 %   Eosinophils Absolute 0.0  0.0 - 0.7 K/uL   Basophils Relative 0  0 - 1 %   Basophils Absolute 0.0  0.0 - 0.1 K/uL  COMPREHENSIVE METABOLIC PANEL      Result Value Range   Sodium 137  135 - 145 mEq/L   Potassium 3.6  3.5 - 5.1 mEq/L   Chloride 101  96 - 112 mEq/L   CO2 26  19 - 32 mEq/L   Glucose, Bld 114 (*) 70 - 99 mg/dL   BUN 11  6 - 23 mg/dL   Creatinine, Ser 4.09  0.50 - 1.10 mg/dL   Calcium 9.0  8.4 - 81.1 mg/dL   Total Protein 7.2  6.0 - 8.3 g/dL   Albumin 3.6  3.5 - 5.2 g/dL   AST 22  0 - 37 U/L   ALT 16  0 - 35 U/L   Alkaline Phosphatase 96  39 - 117 U/L   Total Bilirubin 0.6  0.3 - 1.2 mg/dL   GFR calc non Af Amer >90  >90 mL/min   GFR calc Af Amer >90  >90 mL/min  URINALYSIS, ROUTINE W REFLEX MICROSCOPIC      Result Value Range   Color, Urine YELLOW  YELLOW   APPearance CLOUDY (*) CLEAR   Specific Gravity, Urine 1.020  1.005 - 1.030   pH 6.0  5.0 - 8.0   Glucose, UA NEGATIVE  NEGATIVE mg/dL   Hgb urine dipstick TRACE (*) NEGATIVE   Bilirubin Urine NEGATIVE  NEGATIVE   Ketones, ur TRACE (*) NEGATIVE mg/dL   Protein, ur 30 (*) NEGATIVE mg/dL   Urobilinogen, UA 1.0  0.0 - 1.0 mg/dL   Nitrite NEGATIVE  NEGATIVE   Leukocytes, UA TRACE (*) NEGATIVE  PREGNANCY, URINE      Result Value Range   Preg Test, Ur NEGATIVE  NEGATIVE  URINE MICROSCOPIC-ADD ON      Result Value Range   Squamous Epithelial / LPF MANY (*) RARE   WBC, UA 11-20  <3 WBC/hpf   RBC / HPF 7-10  <3 RBC/hpf   Bacteria, UA FEW (*) RARE    No results  found.   No diagnosis found.    MDM    The chart was scribed for me under my direct supervision.  I personally performed the history, physical, and medical decision making and all procedures in the evaluation of this patient.Benny Lennert, MD 12/18/12 1031

## 2012-12-18 NOTE — Telephone Encounter (Signed)
C/o nausea, and vomitting x 4 hours, since 3am,this morning with a temp of 103 States started med for UTI 1 day ago. I called to see when urgent care with [place] opened, 11am was the time.Based on pt's complaint I advised herr to go instead to [facility] emergency room, she agreed. I will call emergency room MD to notify  That I am sending [facility]

## 2012-12-20 LAB — URINE CULTURE
Colony Count: NO GROWTH
Culture: NO GROWTH

## 2012-12-20 NOTE — Telephone Encounter (Signed)
The patient. She states she began vomiting with fever on Friday night after taking ibuprofen. She's muscle aches and headache as well. She is now improved but still has some low-grade fever she is using Phenergan for nausea. She denies any diarrhea. She states she feels like she has the flu. Continue supportive care with fluids and antiemetics she's also still taking antibiotics for urine infection, Tylenol can be alternated with ibuprofen for fever

## 2013-01-11 ENCOUNTER — Ambulatory Visit (INDEPENDENT_AMBULATORY_CARE_PROVIDER_SITE_OTHER): Payer: 59 | Admitting: Urology

## 2013-01-11 DIAGNOSIS — N390 Urinary tract infection, site not specified: Secondary | ICD-10-CM

## 2013-03-01 ENCOUNTER — Ambulatory Visit (INDEPENDENT_AMBULATORY_CARE_PROVIDER_SITE_OTHER): Payer: 59 | Admitting: Family Medicine

## 2013-03-01 ENCOUNTER — Encounter: Payer: Self-pay | Admitting: Family Medicine

## 2013-03-01 VITALS — BP 101/68 | HR 88 | Resp 18 | Ht 64.25 in | Wt 135.0 lb

## 2013-03-01 DIAGNOSIS — F411 Generalized anxiety disorder: Secondary | ICD-10-CM

## 2013-03-01 DIAGNOSIS — F32A Depression, unspecified: Secondary | ICD-10-CM

## 2013-03-01 DIAGNOSIS — F329 Major depressive disorder, single episode, unspecified: Secondary | ICD-10-CM

## 2013-03-01 DIAGNOSIS — F3289 Other specified depressive episodes: Secondary | ICD-10-CM

## 2013-03-01 MED ORDER — PAROXETINE HCL 10 MG PO TABS: 10.0000 mg | ORAL_TABLET | Freq: Every day | ORAL | Status: DC

## 2013-03-01 NOTE — Patient Instructions (Signed)
Start paxil 10mg  at bedtime Medical release needed for TMS [name] Follow-up with psychiatry as scheduled F/U [facility] as needed

## 2013-03-02 DIAGNOSIS — F411 Generalized anxiety disorder: Secondary | ICD-10-CM | POA: Insufficient documentation

## 2013-03-02 NOTE — Assessment & Plan Note (Signed)
Currently on abilify, did well with SSRI prozac in past, however I can not determine if her vision symptoms were due to prozac or more combination of that with wellbutrin, at any rate will add paxil 10mg  She has f/u with psychiatry in 3 weeks I will call [name] at [employer]- psychiatry to alert her ? Mood disorder, she has been on multiple mood stabilizers in the past

## 2013-03-02 NOTE — Assessment & Plan Note (Signed)
Per above add low dose SSRI

## 2013-03-02 NOTE — Progress Notes (Signed)
  Subjective:    Patient ID: [name], female    DOB: [birth year], 32 y.o.   MRN: [id number]  HPI  Pt here to discuss medications for anxiety/depression. She has long standing history of both. Has been in and out of therapy/psychiatric care. Returned to care a few weeks ago, was started on abilify. She does not know her actual diagnosis but thinks she has borderline personality disorder, like her father. She is able to "keep it together" during work hours as a Engineer, civil (consulting), but when she gets home is exteremly stressed, angry, crying all the time, has difficulty concentrating. She did better while on prozac, however had vision disturbance at Prozac 40mg  with wellbutrin, she is not sure if it was the prozac by itself or the combination In the past was on lamictal, trileptal as well, given by GYN, non other those medications helped Abilify has helped some but still feels like she is on edge.  Review of Systems - per above   GEN- denies fatigue, fever, weight loss,weakness, recent illness HEENT- denies eye drainage, change in vision, nasal discharge, CVS- denies chest pain, palpitations RESP- denies SOB, cough, wheeze ABD- denies N/V, change in stools, abd pain Neuro- denies headache, dizziness, syncope, seizure activity       Objective:   Physical Exam  GEN-NAD, alert and oriented x 3 Psych- not overly anxious or depressed appearing, good eye contact, well dressed, no apparent SI, no apparent hallucinations.      Assessment & Plan:

## 2013-03-10 ENCOUNTER — Encounter: Payer: Self-pay | Admitting: Family Medicine

## 2013-03-10 ENCOUNTER — Telehealth: Payer: Self-pay | Admitting: Family Medicine

## 2013-03-10 NOTE — Telephone Encounter (Signed)
I spoke with patient's psychiatrist Alden Hipp neil Patient's diagnosis is actually bipolar mixed type. I explained to her come in with anxiety and depression symptoms and she was restarted back on low-dose SSRI. She states she understands that however with her bipolar is likely cause her to come in it. She recommends increasing the Abilify 10 mg which should have been done a few weeks ago however she did not receive a call back from the patient. If she is actually taking 10 mg and needs to be increased to 15 mg a she would recommend discontinuing the low-dose SSRI. She has a followup appointment next week in the office.  Discussed with patient recommendations per above

## 2013-03-30 ENCOUNTER — Ambulatory Visit (INDEPENDENT_AMBULATORY_CARE_PROVIDER_SITE_OTHER): Payer: 59 | Admitting: Family Medicine

## 2013-03-30 ENCOUNTER — Encounter: Payer: Self-pay | Admitting: Family Medicine

## 2013-03-30 VITALS — BP 110/70 | HR 80 | Temp 99.1°F | Resp 20 | Ht 63.0 in | Wt 134.0 lb

## 2013-03-30 DIAGNOSIS — W57XXXA Bitten or stung by nonvenomous insect and other nonvenomous arthropods, initial encounter: Secondary | ICD-10-CM

## 2013-03-30 MED ORDER — DOXYCYCLINE HYCLATE 100 MG PO TABS: 100.0000 mg | ORAL_TABLET | Freq: Two times a day (BID) | ORAL | Status: DC

## 2013-03-30 NOTE — Patient Instructions (Signed)
Warm compresses  Use topical steroid cream twice a day for itching  Antibiotics as prescribed

## 2013-03-30 NOTE — Assessment & Plan Note (Signed)
Concern for infected insect bite where the tick was found. Open her on doxycycline. I do not see a plastic erythema migrans rash. She can also use topical cortisone for the itching. She will call if there any changes

## 2013-03-30 NOTE — Progress Notes (Signed)
  Subjective:    Patient ID: [name], female    DOB: April 03, 1981, 32 y.o.   MRN: [id number]  HPI  Patient here with 2 insect bites. She pulled a tick off of her about 3 weeks ago since then the area has become swollen and raised. Is also itchy in nature. She's had some clear fluid draining but no pus. She denies any fever joint aches  She's been followed by her psychiatrist. She is now on Abilify 15 mg and is doing well. Her actual diagnosis is bipolar disorder. She's not had any difficulties with the medication   Review of Systems - per above  GEN- denies fatigue, fever, weight loss,weakness, recent illness ABD- denies N/V, change in stools, abd pain MSK- + joint pain, muscle aches, injury Neuro- denies headache, dizziness, syncope, seizure activity       Objective:   Physical Exam GEN-NAD,alert and oriented x 3 Skin- Left side-a few cm below bra line- two 1.5cm erythematous lesions - raised with insect bite in center, clear fluid expressed from 1 lesion, no fluctuance, mild induration. Psych- normal affect and mood      Assessment & Plan:

## 2013-04-11 ENCOUNTER — Ambulatory Visit: Payer: Self-pay | Admitting: Family Medicine

## 2013-05-02 ENCOUNTER — Telehealth: Payer: Self-pay | Admitting: Family Medicine

## 2013-05-02 DIAGNOSIS — M2550 Pain in unspecified joint: Secondary | ICD-10-CM

## 2013-05-02 MED ORDER — HYDROCODONE-ACETAMINOPHEN 5-325 MG PO TABS: 1.0000 | ORAL_TABLET | Freq: Four times a day (QID) | ORAL | Status: DC | PRN

## 2013-05-02 NOTE — Telephone Encounter (Signed)
I called and left a message for pt, whom I know well. Will refill the the hydrocodone She can continue NSAIDS Will obtain labs for multiple joint pain Low likely due to Abilify , other possibility due to Tick bite pt had a few weeks ago  Prednisone did not help very much  Left message for pt to return in AM to discuss pain

## 2013-05-02 NOTE — Telephone Encounter (Signed)
Pt sent a message. I am having pain in my lower back again and also my neck is hurting and I can't turn it. Happens occasionally. Also having pain in the joints/tendons? In my wrists (both) Went to UC a few weeks ago for it and got prednisone. Feel like Im falling apart. Could I have a refill of the hydrocodone and maybe some bloodwork to check to see where all this pain is coming from. Been taking Ibuprofen through the weekend with no relief. Would have sent a mychart message but can't log on it here. Thanks!!

## 2013-05-04 ENCOUNTER — Ambulatory Visit (INDEPENDENT_AMBULATORY_CARE_PROVIDER_SITE_OTHER): Payer: 59 | Admitting: Family Medicine

## 2013-05-04 ENCOUNTER — Telehealth: Payer: Self-pay | Admitting: Family Medicine

## 2013-05-04 VITALS — BP 100/60 | HR 72 | Temp 99.2°F | Resp 18 | Wt 132.0 lb

## 2013-05-04 DIAGNOSIS — R202 Paresthesia of skin: Secondary | ICD-10-CM

## 2013-05-04 DIAGNOSIS — R209 Unspecified disturbances of skin sensation: Secondary | ICD-10-CM

## 2013-05-04 DIAGNOSIS — M62838 Other muscle spasm: Secondary | ICD-10-CM

## 2013-05-04 DIAGNOSIS — M2550 Pain in unspecified joint: Secondary | ICD-10-CM

## 2013-05-04 MED ORDER — CARISOPRODOL 350 MG PO TABS: 350.0000 mg | ORAL_TABLET | Freq: Three times a day (TID) | ORAL | Status: DC | PRN

## 2013-05-04 NOTE — Telephone Encounter (Signed)
Called pharmacy to check on prescription for pain..pharmacy does have it ready for her and [name] was called in today.

## 2013-05-04 NOTE — Patient Instructions (Signed)
Take the hydrocodone and [facility] Call if you do not improve WE will call with lab results F/U as needed

## 2013-05-05 ENCOUNTER — Encounter: Payer: Self-pay | Admitting: Family Medicine

## 2013-05-05 DIAGNOSIS — M62838 Other muscle spasm: Secondary | ICD-10-CM | POA: Insufficient documentation

## 2013-05-05 DIAGNOSIS — M2550 Pain in unspecified joint: Secondary | ICD-10-CM | POA: Insufficient documentation

## 2013-05-05 DIAGNOSIS — R202 Paresthesia of skin: Secondary | ICD-10-CM | POA: Insufficient documentation

## 2013-05-05 LAB — CBC WITH DIFFERENTIAL/PLATELET
Basophils Absolute: 0 10*3/uL (ref 0.0–0.1)
Eosinophils Absolute: 0.1 10*3/uL (ref 0.0–0.7)
Eosinophils Relative: 2 % (ref 0–5)
HCT: 39.9 % (ref 36.0–46.0)
Lymphocytes Relative: 29 % (ref 12–46)
MCH: 26.9 pg (ref 26.0–34.0)
MCHC: 33.8 g/dL (ref 30.0–36.0)
MCV: 79.5 fL (ref 78.0–100.0)
Monocytes Absolute: 0.4 10*3/uL (ref 0.1–1.0)
RDW: 14.8 % (ref 11.5–15.5)
WBC: 6.4 10*3/uL (ref 4.0–10.5)

## 2013-05-05 LAB — SEDIMENTATION RATE: Sed Rate: 1 mm/hr (ref 0–22)

## 2013-05-05 LAB — COMPREHENSIVE METABOLIC PANEL
ALT: 16 U/L (ref 0–35)
AST: 19 U/L (ref 0–37)
Alkaline Phosphatase: 67 U/L (ref 39–117)
Creat: 0.71 mg/dL (ref 0.50–1.10)
Total Bilirubin: 0.3 mg/dL (ref 0.3–1.2)

## 2013-05-05 LAB — RHEUMATOID FACTOR: Rhuematoid fact SerPl-aCnc: <10 IU/mL (ref <=14)

## 2013-05-05 LAB — C-REACTIVE PROTEIN: CRP: <0.5 mg/dL (ref <0.60)

## 2013-05-05 NOTE — Assessment & Plan Note (Signed)
Pt concerned about multiple joint sites Responded well to prednisone Treated for tick bite 4 weeks ago with doxycycline ? Possible lyme but she did have adequate treatment Doubt related to her abilify Will start with basic bloodwork and go from there the only current pain is in her neck

## 2013-05-05 NOTE — Assessment & Plan Note (Signed)
She is stiff with noticeable spasm, likley due to how she slept the other night Treat with pain meds, muscle relaxer Hold on imaging I dont think this will benefit [place] at this time, neurological exam normal

## 2013-05-05 NOTE — Assessment & Plan Note (Signed)
Symptoms do not fit true radiculopathy from cervical region, her exam is normal there is no weakness, it is still possible she has a pinched nerve causing symptoms At this time will monitor as present for only past 3 days  Start medications below

## 2013-05-05 NOTE — Progress Notes (Signed)
  Subjective:    Patient ID: [name], female    DOB: 05-28-1981, 32 y.o.   MRN: [id number]  HPI  Pt here with neck pain and tingling in on and off in left forearm. Has also had pain in bilateral wrist and lower back but those have resolved. Seen by UC 1 week ago due to pain in Right wrist, diagnosed with tendinitis and given prednisone, had lower back pain which is chronic at the same time but both resolved with prednisone. A few days later had pain in left wrist but that resolved with ibuprofen. Denies any swelling of joints. Sunday woke up with pain in neck worse on right side and unable to turn it. She then noticed tingling sensation that would come and go on her forearm and hand but denies radiating pain from neck. Denies any weakness in arm, denies any injury.   Review of Systems  GEN- denies fatigue, fever, weight loss,weakness, recent illness HEENT- denies eye drainage, change in vision, nasal discharge, CVS- denies chest pain, palpitations RESP- denies SOB, cough, wheeze ABD- denies N/V, change in stools, abd pain MSK- + joint pain, muscle aches, injury Neuro- denies headache, dizziness, syncope, seizure activity      Objective:   Physical Exam GEN- NAD, alert and oriented x3 HEENT- PERRL, EOMI, non injected sclera, pink conjunctiva, MMM, oropharynx clear Neck- Stiff ROM, +spasm neck Right side, good Rotation to left, spine NT CVS- RRR, no murmur RESP-CTAB MSK- Bilat wrist, elbow normal inspection, normal ROM, ankles and knees normal ROM, normal inspection Neuro- CNII-XII in tact, no focal deficits, sensation in tact , normal monofilament testing UE, DTR symmetric, normal tone and musculature UE EXT- No edema Pulses- Radial 2+        Assessment & Plan:

## 2013-08-22 ENCOUNTER — Ambulatory Visit (INDEPENDENT_AMBULATORY_CARE_PROVIDER_SITE_OTHER): Payer: 59 | Admitting: Family Medicine

## 2013-08-22 VITALS — BP 100/60 | HR 78 | Temp 98.9°F | Resp 18 | Ht 63.0 in | Wt 130.0 lb

## 2013-08-22 DIAGNOSIS — Z9889 Other specified postprocedural states: Secondary | ICD-10-CM

## 2013-08-22 DIAGNOSIS — R87613 High grade squamous intraepithelial lesion on cytologic smear of cervix (HGSIL): Secondary | ICD-10-CM

## 2013-08-22 DIAGNOSIS — F319 Bipolar disorder, unspecified: Secondary | ICD-10-CM

## 2013-08-22 NOTE — Assessment & Plan Note (Signed)
Followed by psychiatry much improved on meds

## 2013-08-22 NOTE — Progress Notes (Signed)
  Subjective:    Patient ID: [name], female    DOB: 1980-10-21, 32 y.o.   MRN: [id number]  HPI  Patient here secondary to repeat Pap smear. 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 She is unable to afford to go back to her GYN at this time therefore came to me as her primary care provider to have a Pap smear that she needs status post procedure. She denies any vaginal bleeding Her last menstrual period was 3 weeks ago.  She still being followed by her psychiatrist for her bipolar disorder and states that her mood is doing well on her current medications         Review of Systems  GEN- denies fatigue, fever, weight loss,weakness, recent illness HEENT- denies eye drainage, change in vision, nasal discharge, CVS- denies chest pain, palpitations RESP- denies SOB, cough, wheeze ABD- denies N/V, change in stools, abd pain GU- denies dysuria, hematuria, dribbling, incontinence       Objective:   Physical Exam GEN- NAD, alert and oriented x3 CVS- RRR, no murmur RESP-CTAB GU- normal external genitalia, vaginal mucosa pink and moist, cervix visualized no growth, mild scarring around os,, no blood form os, minimal thin clear discharge, no CMT, no ovarian masses, uterus normal size EXT- No edema Pulses- Radial 2+ Pysch- normal affect and mood        Assessment & Plan:

## 2013-08-22 NOTE — Assessment & Plan Note (Signed)
PAP Smear done, will send to her GYN so they are aware and see what f/u would be needed pending results

## 2013-08-22 NOTE — Patient Instructions (Signed)
We will call with results Result will be faxed to [facility] F/U as needed or in 1 year for physical

## 2013-08-23 LAB — PAP THINPREP ASCUS RFLX HPV RFLX TYPE

## 2014-04-24 ENCOUNTER — Encounter: Payer: Self-pay | Admitting: Family Medicine

## 2014-04-25 ENCOUNTER — Telehealth: Payer: Self-pay | Admitting: Family Medicine

## 2014-04-25 DIAGNOSIS — F319 Bipolar disorder, unspecified: Secondary | ICD-10-CM

## 2014-04-25 MED ORDER — LITHIUM CARBONATE 300 MG PO TABS: 300.0000 mg | ORAL_TABLET | Freq: Two times a day (BID) | ORAL | Status: DC

## 2014-04-25 NOTE — Telephone Encounter (Signed)
I spoke with [name], a lot of depressive episodes and crying spells she even had an episode of cutting. She's not had any suicidal plan. She will has difficulty getting to her psychiatrist which is only there once a week in [place] which is difficult for her. She would offer her medicine about a month ago and over the past month her symptoms have worsened. She did restart latuda at 40mg  yesterday, as she would have to start her lithium back to 450 mg twice a day. I'm concerned about the high dose of lithium and that she is to be taper backup therefore will change her to lithium 300 mg twice a day. It is okay for her to continue the latuda-40 mg

## 2014-04-25 NOTE — Telephone Encounter (Signed)
Called and left voicemail for pt, referral has been placed Will plan to restart her medications until seen by psychiatrist

## 2014-04-28 ENCOUNTER — Telehealth (HOSPITAL_COMMUNITY): Payer: Self-pay | Admitting: *Deleted

## 2014-06-02 ENCOUNTER — Encounter (HOSPITAL_COMMUNITY): Payer: Self-pay | Admitting: Psychiatry

## 2014-06-02 ENCOUNTER — Ambulatory Visit (INDEPENDENT_AMBULATORY_CARE_PROVIDER_SITE_OTHER): Payer: 59 | Admitting: Psychiatry

## 2014-06-02 DIAGNOSIS — F3162 Bipolar disorder, current episode mixed, moderate: Secondary | ICD-10-CM

## 2014-06-02 DIAGNOSIS — F3160 Bipolar disorder, current episode mixed, unspecified: Secondary | ICD-10-CM

## 2014-06-02 MED ORDER — PAROXETINE HCL 20 MG PO TABS: 20.0000 mg | ORAL_TABLET | Freq: Every day | ORAL | Status: DC

## 2014-06-02 MED ORDER — LITHIUM CARBONATE 300 MG PO TABS: 300.0000 mg | ORAL_TABLET | Freq: Two times a day (BID) | ORAL | Status: DC
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
## 2014-06-08 ENCOUNTER — Encounter: Payer: Self-pay | Admitting: Family Medicine

## 2014-06-08 ENCOUNTER — Ambulatory Visit (INDEPENDENT_AMBULATORY_CARE_PROVIDER_SITE_OTHER): Payer: 59 | Admitting: Family Medicine

## 2014-06-08 VITALS — BP 100/64 | HR 80 | Temp 99.0°F | Resp 16 | Ht 64.5 in | Wt 131.0 lb

## 2014-06-08 DIAGNOSIS — J019 Acute sinusitis, unspecified: Secondary | ICD-10-CM

## 2014-06-08 MED ORDER — AMOXICILLIN-POT CLAVULANATE 875-125 MG PO TABS: 1.0000 | ORAL_TABLET | Freq: Two times a day (BID) | ORAL | Status: DC

## 2014-06-08 NOTE — Progress Notes (Signed)
Subjective:    Patient ID: [name], female    DOB: 1981-08-26, 33 y.o.   MRN: [id number]  HPI  Patient is a 33 year old white female who developed a sore throat last Friday. He then progressed to pain and pressure in her sinuses. She continues to have sinus pressure. She is also having postnasal drip and irritation in her throat. She is now started developed tightness and irritation in her chest with a nonproductive cough. She denies any fevers or chills. She denies any nausea or vomiting or diarrhea. Past Medical History  Diagnosis Date  . 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 Depression   . HSIL on Pap smear of cervix 2013    [place]  . Bipolar disorder, mixed   . Bipolar 1 disorder, mixed    Past Surgical History  Procedure Laterality Date  . Cholecystectomy    . Tubal ligation     Current Outpatient Prescriptions on File Prior to Visit  Medication Sig Dispense Refill  . lithium 300 MG tablet Take 1 tablet (300 mg total) by mouth 2 (two) times daily.  60 tablet  3  . PARoxetine (PAXIL) 20 MG tablet Take 1 tablet (20 mg total) by mouth daily.  30 tablet  2   No current facility-administered medications on file prior to visit.   Allergies  Allergen Reactions  . Lamictal [Lamotrigine]     Knots in throat and back of head   History   Social History  . Marital Status: Married    Spouse Name: N/A    Number of Children: N/A  . Years of Education: N/A   Occupational History  . Not on file.   Social History Main Topics  . Smoking status: Current Every Day Smoker -- 1.00 packs/day    Types: Cigarettes  . Smokeless tobacco: Not on file  . Alcohol Use: Yes     Comment: socially only less than monthly  . Drug Use: No  . Sexual Activity: Yes   Other Topics Concern  . Not on file   Social History Narrative  . No narrative on file     Review of Systems  All other systems reviewed and are negative.      Objective:   Physical Exam  Vitals reviewed. HENT:  Right Ear: Tympanic membrane, external ear and ear canal normal.  Left Ear: Tympanic membrane, external ear and ear canal normal.  Nose: Mucosal edema and rhinorrhea present. Right sinus exhibits no maxillary sinus tenderness and no frontal sinus tenderness. Left sinus exhibits no maxillary sinus tenderness and no frontal sinus tenderness.  Mouth/Throat: Oropharynx is clear and moist. No oropharyngeal exudate.  Eyes: Conjunctivae are normal. No scleral icterus.  Neck: Neck supple.  Cardiovascular: Normal rate, regular rhythm and normal heart sounds.   Pulmonary/Chest: Effort normal and breath sounds normal. No respiratory distress. She has no wheezes. She has no rales.  Abdominal: Soft. Bowel sounds are normal.  Lymphadenopathy:    She has no cervical adenopathy.          Assessment & Plan:  Acute rhinosinusitis - Plan: amoxicillin-clavulanate (AUGMENTIN) 875-125 MG per tablet  the patient has a viral upper respiratory infection/sinusitis. I recommended tincture of time. I recommended Sudafed for congestion. I recommended Mucinex DM as needed for cough. Anticipate spontaneous resolution of the next 48-72 hours. If the patient's symptoms drastically worsen, I want her to begin taking Augmentin 875 mg by  mouth twice a day for 10 days.

## 2014-07-03 LAB — LITHIUM LEVEL: Lithium Lvl: 0.5 mEq/L — ABNORMAL LOW (ref 0.80–1.40)

## 2014-07-07 ENCOUNTER — Encounter (HOSPITAL_COMMUNITY): Payer: Self-pay | Admitting: Psychiatry

## 2014-07-07 ENCOUNTER — Ambulatory Visit (INDEPENDENT_AMBULATORY_CARE_PROVIDER_SITE_OTHER): Payer: 59 | Admitting: Psychiatry

## 2014-07-07 VITALS — BP 115/75 | HR 88 | Ht 64.5 in | Wt 135.6 lb

## 2014-07-07 DIAGNOSIS — F3162 Bipolar disorder, current episode mixed, moderate: Secondary | ICD-10-CM

## 2014-07-07 DIAGNOSIS — F3160 Bipolar disorder, current episode mixed, unspecified: Secondary | ICD-10-CM

## 2014-07-07 MED ORDER — LITHIUM CARBONATE 300 MG PO TABS: ORAL_TABLET | ORAL | Status: DC

## 2014-07-07 MED ORDER — FLUOXETINE HCL 20 MG PO CAPS: 20.0000 mg | ORAL_CAPSULE | Freq: Every day | ORAL | Status: DC
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
## 2014-07-10 ENCOUNTER — Telehealth (HOSPITAL_COMMUNITY): Payer: Self-pay | Admitting: *Deleted

## 2014-07-11 ENCOUNTER — Other Ambulatory Visit (HOSPITAL_COMMUNITY): Payer: Self-pay | Admitting: Psychiatry

## 2014-07-11 MED ORDER — LITHIUM CARBONATE 300 MG PO TABS: ORAL_TABLET | ORAL | Status: DC

## 2014-07-11 NOTE — Telephone Encounter (Signed)
done

## 2014-07-11 NOTE — Telephone Encounter (Signed)
Pt came in to get a hard copy of her Lithium. Pt agreed with written Rx. Pt D/L number is [phone] and pt agreed with Rx script.

## 2014-07-11 NOTE — Telephone Encounter (Signed)
Pt came in to get a hard copy of her Lithium. Pt agreed with written Rx.

## 2014-08-08 ENCOUNTER — Telehealth: Payer: Self-pay | Admitting: Family Medicine

## 2014-08-08 MED ORDER — HYDROCODONE-ACETAMINOPHEN 5-325 MG PO TABS: 1.0000 | ORAL_TABLET | Freq: Four times a day (QID) | ORAL | Status: DC | PRN

## 2014-08-08 NOTE — Telephone Encounter (Signed)
Patient called in with a flare of her chronic back pain she has mild degenerative disc disease with bulging disc at L5-S1 she was doing some housework this weekend and felt a pulling sensation in her lower back she has some throbbing into her lower back and her right buttocks region denies any change of bowel or bladder. She requests a refill on her pain medication.  We'll go ahead and give her refill she is to come in for a visit if not improved

## 2014-08-09 LAB — LITHIUM LEVEL: Lithium Lvl: 0.8 mEq/L (ref 0.80–1.40)

## 2014-08-11 ENCOUNTER — Ambulatory Visit (INDEPENDENT_AMBULATORY_CARE_PROVIDER_SITE_OTHER): Payer: 59 | Admitting: Psychiatry

## 2014-08-11 ENCOUNTER — Encounter (HOSPITAL_COMMUNITY): Payer: Self-pay | Admitting: Psychiatry

## 2014-08-11 VITALS — BP 109/69 | HR 70 | Ht 64.5 in | Wt 135.4 lb

## 2014-08-11 DIAGNOSIS — F3160 Bipolar disorder, current episode mixed, unspecified: Secondary | ICD-10-CM

## 2014-08-11 DIAGNOSIS — F3162 Bipolar disorder, current episode mixed, moderate: Secondary | ICD-10-CM

## 2014-08-11 MED ORDER — LITHIUM CARBONATE 300 MG PO TABS: ORAL_TABLET | ORAL | Status: DC

## 2014-08-11 MED ORDER — FLUOXETINE HCL 20 MG PO CAPS: 20.0000 mg | ORAL_CAPSULE | Freq: Every day | ORAL | Status: DC
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
## 2014-09-29 ENCOUNTER — Telehealth (HOSPITAL_COMMUNITY): Payer: Self-pay | Admitting: *Deleted

## 2014-09-29 ENCOUNTER — Encounter (HOSPITAL_COMMUNITY): Payer: Self-pay | Admitting: Psychiatry

## 2014-09-29 ENCOUNTER — Ambulatory Visit (INDEPENDENT_AMBULATORY_CARE_PROVIDER_SITE_OTHER): Payer: 59 | Admitting: Psychiatry

## 2014-09-29 VITALS — BP 110/82 | Ht 64.0 in | Wt 138.0 lb

## 2014-09-29 DIAGNOSIS — F3162 Bipolar disorder, current episode mixed, moderate: Secondary | ICD-10-CM

## 2014-09-29 MED ORDER — VILAZODONE HCL 40 MG PO TABS: 40.0000 mg | ORAL_TABLET | Freq: Every day | ORAL | Status: DC

## 2014-09-29 MED ORDER — LITHIUM CARBONATE 300 MG PO TABS: ORAL_TABLET | ORAL | Status: DC

## 2014-09-29 MED ORDER — CLONAZEPAM 0.5 MG PO TABS: ORAL_TABLET | ORAL | Status: DC

## 2014-09-29 NOTE — Telephone Encounter (Signed)
Pt seen today

## 2014-09-29 NOTE — Telephone Encounter (Signed)
ALSO WK # [phone].  PHONE CALL FROM PATIENT, REALLY PANICKY, LIKE SHE CAN'T CATCH HER BREATH.   WANTS TO TALK WITH YOU.
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
## 2014-11-03 ENCOUNTER — Ambulatory Visit (HOSPITAL_COMMUNITY): Payer: Self-pay | Admitting: Psychiatry

## 2014-11-03 ENCOUNTER — Encounter (HOSPITAL_COMMUNITY): Payer: Self-pay | Admitting: Psychiatry

## 2014-11-28 ENCOUNTER — Encounter: Payer: Self-pay | Admitting: Family Medicine

## 2014-11-28 ENCOUNTER — Telehealth (HOSPITAL_COMMUNITY): Payer: Self-pay | Admitting: *Deleted

## 2014-11-28 ENCOUNTER — Ambulatory Visit (INDEPENDENT_AMBULATORY_CARE_PROVIDER_SITE_OTHER): Payer: 59 | Admitting: Family Medicine

## 2014-11-28 VITALS — BP 118/62 | HR 78 | Temp 99.3°F | Resp 14 | Ht 64.0 in | Wt 135.0 lb

## 2014-11-28 DIAGNOSIS — Z124 Encounter for screening for malignant neoplasm of cervix: Secondary | ICD-10-CM

## 2014-11-28 DIAGNOSIS — Z0000 Encounter for general adult medical examination without abnormal findings: Secondary | ICD-10-CM

## 2014-11-28 DIAGNOSIS — M5126 Other intervertebral disc displacement, lumbar region: Secondary | ICD-10-CM

## 2014-11-28 DIAGNOSIS — F319 Bipolar disorder, unspecified: Secondary | ICD-10-CM

## 2014-11-28 DIAGNOSIS — M5136 Other intervertebral disc degeneration, lumbar region: Secondary | ICD-10-CM

## 2014-11-28 MED ORDER — HYDROCODONE-ACETAMINOPHEN 5-325 MG PO TABS: 1.0000 | ORAL_TABLET | Freq: Four times a day (QID) | ORAL | Status: DC | PRN

## 2014-11-28 NOTE — Telephone Encounter (Signed)
lmtcb

## 2014-11-28 NOTE — Telephone Encounter (Signed)
Pt called asking for Samples of her Viibryd. Per pt she is taking 40 mg and is out and is unable to get medication until she gets paid. Pt next appt is 12-29-14. [phone].

## 2014-11-28 NOTE — Patient Instructions (Signed)
I recommend eye visit once a year I recommend dental visit every 6 months Goal is to  Exercise 30 minutes 5 days a week Get the labs done fasting F/U as needed

## 2014-11-28 NOTE — Progress Notes (Signed)
Patient ID: [name], female   DOB: Sep 04, 1981, 34 y.o.   MRN: [id number]   Subjective:    Patient ID: [name], female    DOB: [birth year], 34 y.o.   MRN: [id number]  Patient presents for PAP    patient here for week physical exam. She has no specific concerns. She has history of HSIL on Pap smear she is status post LEEP which was done 2 years ago she is on 1 year follow-up for her Pap smears because of this. She's not had any abnormal bleeding. Her last menstrual period was on January 15. She still being followed by her psychiatrist and is doing well on her medications. She does occasionally still have back flares and uses her hydrocodone but this is very rare she does request a refill on this today. Her immunizations are up-to-date. I reviewed her labs I do not see a lipid panel therefore this will be obtained    Review Of Systems:  GEN- denies fatigue, fever, weight loss,weakness, recent illness HEENT- denies eye drainage, change in vision, nasal discharge, CVS- denies chest pain, palpitations RESP- denies SOB, cough, wheeze ABD- denies N/V, change in stools, abd pain GU- denies dysuria, hematuria, dribbling, incontinence MSK- denies joint pain, muscle aches, injury Neuro- denies headache, dizziness, syncope, seizure activity       Objective:    BP 118/62 mmHg  Pulse 78  Temp(Src) 99.3 F (37.4 C) (Oral)  Resp 14  Ht 5\' 4"  (1.626 m)  Wt 135 lb (61.236 kg)  BMI 23.16 kg/m2  LMP 11/08/2014 (Approximate) GEN- NAD, alert and oriented x3 HEENT- PERRL, EOMI, non injected sclera, pink conjunctiva, MMM, oropharynx clear Neck- Supple, no thyromegaly CVS- RRR, no murmur RESP-CTAB Breast- normal symmetry, no nipple inversion,no nipple drainage, no nodules or lumps felt Nodes- no axillary nodes ABD-NABS,soft,NT,ND GU- normal external genitalia, vaginal mucosa pink and moist, cervix visualized no growth, no blood form os, minimal thin clear discharge, no CMT, no ovarian  masses, uterus normal size EXT- No edema Pulses- Radial, DP- 2+        Assessment & Plan:      Problem List Items Addressed This Visit    None    Visit Diagnoses    Routine general medical examination at a health care facility    -  Primary    PAP smear done, has had abnormal PAP in past, needs PAP yearly, immunizations UTD, fasting labs to be done    Relevant Orders    CBC with Differential/Platelet    Comprehensive metabolic panel    Lipid panel    Cervical cancer screening        Relevant Orders    PAP, ThinPrep ASCUS Rflx HPV Rflx Type       Note: This dictation was prepared with Dragon dictation along with smaller phrase technology. Any transcriptional errors that result from this process are unintentional.

## 2014-11-28 NOTE — Telephone Encounter (Signed)
Pt is aware and came into office to pick up script

## 2014-11-28 NOTE — Telephone Encounter (Signed)
ok 

## 2014-11-28 NOTE — Assessment & Plan Note (Signed)
Declines epidural injections, stays active and exercises to control pain Given refill on pain meds,uses rarely

## 2014-11-28 NOTE — Telephone Encounter (Signed)
Pt came to office to pick up her samples that Dr. Harrington Challenger agreed for her to have. Pt d/L number is [phone]

## 2014-11-28 NOTE — Assessment & Plan Note (Signed)
Followed by psychiatry, stable on medications

## 2014-11-29 LAB — PAP THINPREP ASCUS RFLX HPV RFLX TYPE

## 2014-11-30 ENCOUNTER — Other Ambulatory Visit: Payer: Self-pay | Admitting: *Deleted

## 2014-11-30 MED ORDER — FLUCONAZOLE 150 MG PO TABS: 150.0000 mg | ORAL_TABLET | Freq: Once | ORAL | Status: DC

## 2014-12-14 ENCOUNTER — Encounter: Payer: Self-pay | Admitting: Family Medicine

## 2014-12-15 ENCOUNTER — Ambulatory Visit (INDEPENDENT_AMBULATORY_CARE_PROVIDER_SITE_OTHER): Payer: 59 | Admitting: Psychiatry

## 2014-12-15 ENCOUNTER — Encounter (HOSPITAL_COMMUNITY): Payer: Self-pay | Admitting: Psychiatry

## 2014-12-15 VITALS — BP 120/77 | HR 76 | Ht 64.0 in | Wt 130.6 lb

## 2014-12-15 DIAGNOSIS — F3162 Bipolar disorder, current episode mixed, moderate: Secondary | ICD-10-CM

## 2014-12-15 DIAGNOSIS — F3160 Bipolar disorder, current episode mixed, unspecified: Secondary | ICD-10-CM

## 2014-12-15 MED ORDER — METHYLPREDNISOLONE (PAK) 4 MG PO TABS: ORAL_TABLET | ORAL | Status: DC

## 2014-12-15 MED ORDER — VILAZODONE HCL 40 MG PO TABS: 40.0000 mg | ORAL_TABLET | Freq: Every day | ORAL | Status: DC

## 2014-12-15 MED ORDER — GUAIFENESIN-CODEINE 100-10 MG/5ML PO SOLN: 5.0000 mL | Freq: Four times a day (QID) | ORAL | Status: DC | PRN

## 2014-12-15 MED ORDER — AZITHROMYCIN 250 MG PO TABS: ORAL_TABLET | ORAL | Status: DC

## 2014-12-15 MED ORDER — ARIPIPRAZOLE 2 MG PO TABS: 2.0000 mg | ORAL_TABLET | Freq: Every day | ORAL | Status: DC
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
## 2014-12-25 ENCOUNTER — Encounter: Payer: Self-pay | Admitting: Family Medicine

## 2014-12-25 MED ORDER — FLUCONAZOLE 150 MG PO TABS: 150.0000 mg | ORAL_TABLET | Freq: Once | ORAL | Status: DC

## 2014-12-29 ENCOUNTER — Ambulatory Visit (HOSPITAL_COMMUNITY): Payer: Self-pay | Admitting: Psychiatry

## 2015-01-05 ENCOUNTER — Ambulatory Visit (INDEPENDENT_AMBULATORY_CARE_PROVIDER_SITE_OTHER): Payer: 59 | Admitting: Psychiatry

## 2015-01-05 ENCOUNTER — Encounter (HOSPITAL_COMMUNITY): Payer: Self-pay | Admitting: Psychiatry

## 2015-01-05 VITALS — BP 111/73 | HR 84 | Ht 64.0 in | Wt 132.0 lb

## 2015-01-05 DIAGNOSIS — F3162 Bipolar disorder, current episode mixed, moderate: Secondary | ICD-10-CM

## 2015-01-05 DIAGNOSIS — F3160 Bipolar disorder, current episode mixed, unspecified: Secondary | ICD-10-CM

## 2015-01-05 LAB — CBC WITH DIFFERENTIAL/PLATELET
BASOS ABS: 0 10*3/uL (ref 0.0–0.1)
BASOS PCT: 0 % (ref 0–1)
Eosinophils Absolute: 0.2 10*3/uL (ref 0.0–0.7)
Eosinophils Relative: 4 % (ref 0–5)
HCT: 43.9 % (ref 36.0–46.0)
Hemoglobin: 14.3 g/dL (ref 12.0–15.0)
LYMPHS PCT: 28 % (ref 12–46)
Lymphs Abs: 1.7 10*3/uL (ref 0.7–4.0)
MCH: 28 pg (ref 26.0–34.0)
MCHC: 32.6 g/dL (ref 30.0–36.0)
MCV: 86.1 fL (ref 78.0–100.0)
MPV: 11.7 fL (ref 8.6–12.4)
Monocytes Absolute: 0.5 10*3/uL (ref 0.1–1.0)
Monocytes Relative: 8 % (ref 3–12)
NEUTROS ABS: 3.7 10*3/uL (ref 1.7–7.7)
NEUTROS PCT: 60 % (ref 43–77)
PLATELETS: 193 10*3/uL (ref 150–400)
RBC: 5.1 MIL/uL (ref 3.87–5.11)
RDW: 14.2 % (ref 11.5–15.5)
WBC: 6.1 10*3/uL (ref 4.0–10.5)

## 2015-01-05 LAB — LIPID PANEL
CHOLESTEROL: 143 mg/dL (ref 0–200)
HDL: 66 mg/dL (ref >=46)
LDL CALC: 61 mg/dL (ref 0–99)
TRIGLYCERIDES: 78 mg/dL (ref <150)
Total CHOL/HDL Ratio: 2.2 Ratio
VLDL: 16 mg/dL (ref 0–40)

## 2015-01-05 LAB — COMPREHENSIVE METABOLIC PANEL
ALBUMIN: 4.3 g/dL (ref 3.5–5.2)
ALT: 25 U/L (ref 0–35)
AST: 27 U/L (ref 0–37)
Alkaline Phosphatase: 69 U/L (ref 39–117)
BILIRUBIN TOTAL: 0.7 mg/dL (ref 0.2–1.2)
BUN: 14 mg/dL (ref 6–23)
CO2: 27 meq/L (ref 19–32)
CREATININE: 0.7 mg/dL (ref 0.50–1.10)
Calcium: 9.4 mg/dL (ref 8.4–10.5)
Chloride: 106 mEq/L (ref 96–112)
Glucose, Bld: 90 mg/dL (ref 70–99)
Potassium: 4.3 mEq/L (ref 3.5–5.3)
SODIUM: 139 meq/L (ref 135–145)
TOTAL PROTEIN: 7 g/dL (ref 6.0–8.3)

## 2015-01-05 MED ORDER — ARIPIPRAZOLE 2 MG PO TABS: 2.0000 mg | ORAL_TABLET | Freq: Every day | ORAL | Status: DC

## 2015-01-05 MED ORDER — VILAZODONE HCL 40 MG PO TABS: 40.0000 mg | ORAL_TABLET | Freq: Every day | ORAL | Status: DC
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
## 2015-01-09 ENCOUNTER — Other Ambulatory Visit (HOSPITAL_COMMUNITY): Payer: Self-pay | Admitting: Psychiatry

## 2015-01-12 ENCOUNTER — Ambulatory Visit (HOSPITAL_COMMUNITY): Payer: Self-pay | Admitting: Psychiatry

## 2015-02-07 ENCOUNTER — Encounter: Payer: Self-pay | Admitting: Family Medicine

## 2015-02-12 ENCOUNTER — Ambulatory Visit (INDEPENDENT_AMBULATORY_CARE_PROVIDER_SITE_OTHER): Payer: 59 | Admitting: Family Medicine

## 2015-02-12 ENCOUNTER — Encounter: Payer: Self-pay | Admitting: Family Medicine

## 2015-02-12 VITALS — BP 118/64 | HR 82 | Temp 101.9°F | Resp 16 | Ht 64.0 in | Wt 138.0 lb

## 2015-02-12 DIAGNOSIS — J989 Respiratory disorder, unspecified: Secondary | ICD-10-CM | POA: Diagnosis not present

## 2015-02-12 DIAGNOSIS — R509 Fever, unspecified: Secondary | ICD-10-CM | POA: Diagnosis not present

## 2015-02-12 LAB — CBC W/MCH & 3 PART DIFF
HCT: 40.7 % (ref 36.0–46.0)
HEMOGLOBIN: 13.6 g/dL (ref 12.0–15.0)
LYMPHS ABS: 1 10*3/uL (ref 0.7–4.0)
LYMPHS PCT: 13 % (ref 12–46)
MCH: 28.8 pg (ref 26.0–34.0)
MCHC: 33.4 g/dL (ref 30.0–36.0)
MCV: 86 fL (ref 78.0–100.0)
NEUTROS PCT: 81 % — AB (ref 43–77)
Neutro Abs: 6.4 10*3/uL (ref 1.7–7.7)
PLATELETS: 142 10*3/uL — AB (ref 150–400)
RBC: 4.73 MIL/uL (ref 3.87–5.11)
RDW: 14.3 % (ref 11.5–15.5)
WBC mixed population: 0.5 10*3/uL (ref 0.1–1.8)
WBC: 7.9 10*3/uL (ref 4.0–10.5)
WBCMIXPER: 6 % (ref 3–18)

## 2015-02-12 LAB — INFLUENZA A AND B
Inflenza A Ag: NEGATIVE
Influenza B Ag: NEGATIVE

## 2015-02-12 MED ORDER — LEVOFLOXACIN 500 MG PO TABS: 500.0000 mg | ORAL_TABLET | Freq: Every day | ORAL | Status: DC

## 2015-02-12 NOTE — Progress Notes (Signed)
Patient ID: [name], female   DOB: Nov 22, 1980, 34 y.o.   MRN: [id number]   Subjective:    Patient ID: [name], female    DOB: 13-Sep-1981, 34 y.o.   MRN: [id number]  Patient presents for Illness  patient here with fever cough with mild production and congestion and body aches, headache for the past 2 days. She works as a Marine scientist in the clinic setting. Her fever has been elevated since yesterday. Positive sick contacts with multiple patients at her clinic. She denies any nausea vomiting no diarrhea. No rash. She is taking over-the-counter fever reducer only.    Review Of Systems:  GEN-+s fatigue,+ fever, weight loss,weakness, recent illness HEENT- denies eye drainage, change in vision, nasal discharge, CVS- denies chest pain, palpitations RESP- denies SOB,+ cough, wheeze ABD- denies N/V, change in stools, abd pain GU- denies dysuria, hematuria, dribbling, incontinence MSK- denies joint pain,+ muscle aches, injury Neuro- + headache, dizziness, syncope, seizure activity       Objective:    BP 118/64 mmHg  Pulse 82  Temp(Src) 101.9 F (38.8 C) (Oral)  Resp 16  Ht 5\' 4"  (1.626 m)  Wt 138 lb (62.596 kg)  BMI 23.68 kg/m2 GEN- NAD, alert and oriented x3,ill appearing, febrile HEENT- PERRL, EOMI, non injected sclera, pink conjunctiva, MMM, oropharynx clear, no maxillary sinus tenderness Neck- Supple, no thyromegaly CVS- RRR, no murmur RESP-upper airway congestion, no rales, no wheeze, normal WOB, wet harsh cough ABD-NABS,soft,NT,ND EXT- No edema Neuro-CNII-XIi intact, neg kernigs, neg brudinski Pulses- Radial - 2+  Flu- NEGATIVE      Assessment & Plan:      Problem List Items Addressed This Visit    None    Visit Diagnoses    Fever, unspecified fever cause    -  Primary    Relevant Orders    Influenza a and b (Completed)    CBC w/[facility] & 3 Part Diff (Completed)    Respiratory illness        Treat for respiratory illness I think this is viral but with  , multiple  sick contacts and smoker, will treat with antibiotics, robitussin DM, fluids, note for work given       Note: This dictation was prepared with Diplomatic Services operational officer dictation along with smaller Company secretary. Any transcriptional errors that result from this process are unintentional.

## 2015-02-12 NOTE — Patient Instructions (Signed)
Take fever reducer  Take antibiotics Plenty of fluids  F/U as needed

## 2015-02-14 ENCOUNTER — Encounter: Payer: Self-pay | Admitting: Family Medicine

## 2015-03-01 ENCOUNTER — Telehealth (HOSPITAL_COMMUNITY): Payer: Self-pay | Admitting: *Deleted

## 2015-03-01 NOTE — Telephone Encounter (Signed)
Pt called stating that she would like that her Abilify be sent to the [facility]. Per pt, her [facility] is trying to charge her $600.00. Pt number is [phone].

## 2015-03-01 NOTE — Telephone Encounter (Signed)
You may send that in

## 2015-03-02 ENCOUNTER — Telehealth (HOSPITAL_COMMUNITY): Payer: Self-pay | Admitting: *Deleted

## 2015-03-02 MED ORDER — ARIPIPRAZOLE 2 MG PO TABS: 2.0000 mg | ORAL_TABLET | Freq: Every day | ORAL | Status: DC

## 2015-03-02 NOTE — Telephone Encounter (Signed)
Medication sent.[name]

## 2015-03-02 NOTE — Telephone Encounter (Signed)
Per Dr. Harrington Challenger to send pt medication that she called about earlier to her new pharmacy.

## 2015-03-05 ENCOUNTER — Other Ambulatory Visit (HOSPITAL_COMMUNITY): Payer: Self-pay | Admitting: Psychiatry

## 2015-03-07 ENCOUNTER — Telehealth (HOSPITAL_COMMUNITY): Payer: Self-pay | Admitting: *Deleted

## 2015-03-07 MED ORDER — VILAZODONE HCL 40 MG PO TABS: 40.0000 mg | ORAL_TABLET | Freq: Every day | ORAL | Status: DC

## 2015-03-07 NOTE — Telephone Encounter (Signed)
Per Dr. Harrington Challenger to call in pt medication to requested pharmacy due to pt previous call.

## 2015-03-07 NOTE — Telephone Encounter (Signed)
Sent medication to pharmacy.   

## 2015-03-07 NOTE — Telephone Encounter (Signed)
Pt called requesting that her Viibryd to be called in to [facility] instead of [facility] due to it being cheaper at [facility]. Per pt insurance is having her pay more if she gets it from [facility].

## 2015-03-07 NOTE — Telephone Encounter (Signed)
You may send it there, ask her which one, there are several

## 2015-03-13 ENCOUNTER — Other Ambulatory Visit: Payer: Self-pay | Admitting: Family Medicine

## 2015-03-13 MED ORDER — HYDROCODONE-ACETAMINOPHEN 5-325 MG PO TABS: 1.0000 | ORAL_TABLET | Freq: Four times a day (QID) | ORAL | Status: DC | PRN

## 2015-03-13 NOTE — Telephone Encounter (Signed)
Okay to refill? 

## 2015-03-13 NOTE — Telephone Encounter (Signed)
LRF 11/28/14  #[id number]  LOV 02/12/15  OK refill?

## 2015-04-02 ENCOUNTER — Ambulatory Visit (INDEPENDENT_AMBULATORY_CARE_PROVIDER_SITE_OTHER): Payer: 59 | Admitting: Family Medicine

## 2015-04-02 ENCOUNTER — Encounter: Payer: Self-pay | Admitting: Family Medicine

## 2015-04-02 VITALS — BP 118/74 | HR 80 | Temp 98.3°F | Resp 18 | Ht 64.5 in | Wt 141.0 lb

## 2015-04-02 DIAGNOSIS — T148 Other injury of unspecified body region: Secondary | ICD-10-CM

## 2015-04-02 DIAGNOSIS — W57XXXA Bitten or stung by nonvenomous insect and other nonvenomous arthropods, initial encounter: Secondary | ICD-10-CM

## 2015-04-02 LAB — CBC WITH DIFFERENTIAL/PLATELET
Basophils Absolute: 0 10*3/uL (ref 0.0–0.1)
Basophils Relative: 0 % (ref 0–1)
Eosinophils Absolute: 0.1 10*3/uL (ref 0.0–0.7)
Eosinophils Relative: 2 % (ref 0–5)
HCT: 41.4 % (ref 36.0–46.0)
HEMOGLOBIN: 13.7 g/dL (ref 12.0–15.0)
LYMPHS ABS: 1.2 10*3/uL (ref 0.7–4.0)
LYMPHS PCT: 21 % (ref 12–46)
MCH: 27.6 pg (ref 26.0–34.0)
MCHC: 33.1 g/dL (ref 30.0–36.0)
MCV: 83.3 fL (ref 78.0–100.0)
MPV: 10.9 fL (ref 8.6–12.4)
Monocytes Absolute: 0.5 10*3/uL (ref 0.1–1.0)
Monocytes Relative: 9 % (ref 3–12)
NEUTROS PCT: 68 % (ref 43–77)
Neutro Abs: 3.9 10*3/uL (ref 1.7–7.7)
Platelets: 182 10*3/uL (ref 150–400)
RBC: 4.97 MIL/uL (ref 3.87–5.11)
RDW: 14.1 % (ref 11.5–15.5)
WBC: 5.7 10*3/uL (ref 4.0–10.5)

## 2015-04-02 LAB — COMPLETE METABOLIC PANEL WITH GFR
ALBUMIN: 3.8 g/dL (ref 3.5–5.2)
ALT: 41 U/L — ABNORMAL HIGH (ref 0–35)
AST: 35 U/L (ref 0–37)
Alkaline Phosphatase: 74 U/L (ref 39–117)
BUN: 15 mg/dL (ref 6–23)
CALCIUM: 9.1 mg/dL (ref 8.4–10.5)
CHLORIDE: 103 meq/L (ref 96–112)
CO2: 26 meq/L (ref 19–32)
Creat: 0.78 mg/dL (ref 0.50–1.10)
GLUCOSE: 78 mg/dL (ref 70–99)
Potassium: 3.8 mEq/L (ref 3.5–5.3)
Sodium: 139 mEq/L (ref 135–145)
TOTAL PROTEIN: 6.7 g/dL (ref 6.0–8.3)
Total Bilirubin: 0.5 mg/dL (ref 0.2–1.2)

## 2015-04-02 MED ORDER — FLUOCINONIDE 0.05 % EX OINT: 1.0000 "application " | TOPICAL_OINTMENT | Freq: Two times a day (BID) | CUTANEOUS | Status: DC

## 2015-04-02 NOTE — Progress Notes (Signed)
Subjective:    Patient ID: [name], female    DOB: 1981-07-24, 34 y.o.   MRN: [id number]  HPI  Patient is a very pleasant 34 year old white female who presents today with a worsening tick bite.  Tick was found attached to her lower left gluteus approximately May 28. Since that time, the patient has developed an erythematous papule that has spread into a plaque. It is approximately 2.5 cm in diameter. It appears to be an urticarial plaque. There is no spreading red ring. She denies any symptoms of [place] spotted fever. She did have a slight headache this weekend but she also had a sore throat. She denies any myalgias or arthralgias. She has no fever. She has no other disseminated rash. She is concerned due to the persistence of the spot and the fact it seems to be getting worse. Past Medical History  Diagnosis Date  . 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 Depression   . HSIL on Pap smear of cervix 2013    [place]  . Bipolar disorder, mixed   . Bipolar 1 disorder, mixed    Past Surgical History  Procedure Laterality Date  . Cholecystectomy    . Tubal ligation     Current Outpatient Prescriptions on File Prior to Visit  Medication Sig Dispense Refill  . ARIPiprazole (ABILIFY) 2 MG tablet Take 1 tablet (2 mg total) by mouth daily. 30 tablet 0  . clonazePAM (KLONOPIN) 0.5 MG tablet Take one daily as needed for anxiety 30 tablet 1  . HYDROcodone-acetaminophen (NORCO) 5-325 MG per tablet Take 1 tablet by mouth every 6 (six) hours as needed for moderate pain. 60 tablet 0  . Vilazodone HCl (VIIBRYD) 40 MG TABS Take 1 tablet (40 mg total) by mouth daily. 30 tablet 2   No current facility-administered medications on file prior to visit.   Allergies  Allergen Reactions  . Lamictal [Lamotrigine]     Knots in throat and back of head   History   Social History  . Marital Status: Married    Spouse Name: N/A  . Number of Children: N/A  .  Years of Education: N/A   Occupational History  . Not on file.   Social History Main Topics  . Smoking status: Current Every Day Smoker -- 1.00 packs/day    Types: Cigarettes  . Smokeless tobacco: Never Used  . Alcohol Use: 0.0 oz/week    0 Standard drinks or equivalent per week     Comment: socially only less than monthly  . Drug Use: No  . Sexual Activity: Yes   Other Topics Concern  . Not on file   Social History Narrative     Review of Systems  All other systems reviewed and are negative.      Objective:   Physical Exam  Cardiovascular: Normal rate, regular rhythm and normal heart sounds.   Pulmonary/Chest: Effort normal and breath sounds normal.  Skin: Rash noted. There is erythema.  Vitals reviewed.         Assessment & Plan:  Tick bite - Plan: B. burgdorfi antibodies by WB, CBC with Differential/Platelet, COMPLETE METABOLIC PANEL WITH GFR, fluocinonide ointment (LIDEX) 0.05 %  I believe the patient is having a local urticarial reaction to the insect bite. Symptoms do not support [facility] spotted fever as a diagnosis. Rash does not appear to be erythema migrans. Rather it is a 2.5 cm urticarial  plaque. I will treat with Lidex ointment applied twice a day for 1 week. Meanwhile I will draw lab work to evaluate for signs of Lyme disease. If the plaque persists or enlarges or if lab work is positive for Lyme disease, I will start the patient on doxycycline 100 mg by mouth twice a day for 21 days. Patient is comfortable with this plan and will call me if the rash persists or increases.

## 2015-04-04 ENCOUNTER — Encounter: Payer: Self-pay | Admitting: Family Medicine

## 2015-04-04 LAB — B. BURGDORFI ANTIBODIES BY WB
B BURGDORFERI IGG ABS (IB): NEGATIVE
B burgdorferi IgM Abs (IB): NEGATIVE

## 2015-04-05 ENCOUNTER — Ambulatory Visit (INDEPENDENT_AMBULATORY_CARE_PROVIDER_SITE_OTHER): Payer: 59 | Admitting: Psychiatry

## 2015-04-05 ENCOUNTER — Encounter (HOSPITAL_COMMUNITY): Payer: Self-pay | Admitting: Psychiatry

## 2015-04-05 VITALS — BP 110/80 | Ht 64.0 in | Wt 142.0 lb

## 2015-04-05 DIAGNOSIS — F3162 Bipolar disorder, current episode mixed, moderate: Secondary | ICD-10-CM

## 2015-04-05 DIAGNOSIS — F3160 Bipolar disorder, current episode mixed, unspecified: Secondary | ICD-10-CM | POA: Diagnosis not present

## 2015-04-05 MED ORDER — LITHIUM CARBONATE 300 MG PO TABS: 900.0000 mg | ORAL_TABLET | Freq: Every day | ORAL | Status: DC

## 2015-04-05 MED ORDER — ARIPIPRAZOLE 2 MG PO TABS: 2.0000 mg | ORAL_TABLET | Freq: Every day | ORAL | Status: DC

## 2015-04-05 MED ORDER — VILAZODONE HCL 40 MG PO TABS: 40.0000 mg | ORAL_TABLET | Freq: Every day | ORAL | Status: DC

## 2015-04-05 MED ORDER — CLONAZEPAM 0.5 MG PO TABS: ORAL_TABLET | ORAL | Status: DC

## 2015-04-05 NOTE — Patient Instructions (Signed)
Gradually increase lithium to 3 at bedtime. Check blood level after 10 days/ 12 hrs after last dose
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
## 2015-04-06 ENCOUNTER — Ambulatory Visit (HOSPITAL_COMMUNITY): Payer: Self-pay | Admitting: Psychiatry

## 2015-05-04 ENCOUNTER — Ambulatory Visit (HOSPITAL_COMMUNITY): Payer: Self-pay | Admitting: Psychiatry

## 2015-05-19 LAB — LITHIUM LEVEL: Lithium Lvl: 0.7 mEq/L — ABNORMAL LOW (ref 0.80–1.40)

## 2015-05-21 ENCOUNTER — Telehealth (HOSPITAL_COMMUNITY): Payer: Self-pay | Admitting: *Deleted

## 2015-05-21 ENCOUNTER — Telehealth: Payer: Self-pay | Admitting: Family Medicine

## 2015-05-21 DIAGNOSIS — R1084 Generalized abdominal pain: Secondary | ICD-10-CM

## 2015-05-21 NOTE — Telephone Encounter (Signed)
Call placed to patient.   States that she feels like her lithium levels are low, but Psych will not go into detail in regards to her levels that were drawn last week. Advised to F/U with Dr. Harrington Challenger (has appt scheduled on 05/25/2015). States that she is feeling very fatigued and groggy, and reports that she just doesn't feel like herself.   Also reports that she is having intermittent sharp pains in RLQ of abd that began last night and has continued to today. Reports that she has discussed pain with PCP and was advised to notify MD if pain returned.   MD please advise.

## 2015-05-21 NOTE — Telephone Encounter (Signed)
Call placed to patient. LMTRC.  

## 2015-05-21 NOTE — Telephone Encounter (Signed)
Lithium was a little low, but I would defer to Dr. Harrington Challenger for this, as I dont prescribe lithium. See if abd pain in conjunctive with any change in bowels, or menses. Schedule OV if needed for exam

## 2015-05-21 NOTE — Telephone Encounter (Signed)
Patient is calling to speak with you about feeling bad and her lithium levels  [phone]

## 2015-05-21 NOTE — Telephone Encounter (Addendum)
Pt called stating she would like to have her Lithium levels. Per pt, she have not been feeling well this weekend and she just wanted to know the levels. Informed that Dr. Harrington Challenger is out of the office and will return on Thursday and pt agreed.

## 2015-05-22 ENCOUNTER — Encounter: Payer: Self-pay | Admitting: Family Medicine

## 2015-05-22 NOTE — Telephone Encounter (Signed)
Make sure she is not constipated, correlate with menses and no UTI symptoms If above Neg and still having pain, We can proceed with CT abd/pelvis with oral contrast , to look at bowels and see if any ovarian cyst at the same time  Also- she can take her pain medication for the headache, push fluids, at this point headache  > 2 days imitrex wont work.  Okay to refill pain meds if needed  If she is having fever with the severe headache and neck pain, needs to go to ER

## 2015-05-22 NOTE — Telephone Encounter (Signed)
Patient returned call via [place].   Message is as follows:  " I think the reason I've been feeling so bad is I had a migraine coming on. (haven't had those since I was a teen) I slept all day Sunday and woke up yesterday to a throbbing in my head and neck. My eyes are pouring water from the pain still today but I can't leave work because its too busy. As far as the stomach pain I haven't felt it since yesterday and sun night. It lasts about 2 mins and I have to press the area and bend over until it passes. Would rate the pain about a 9. Burning stabbing pain. "  MD to be made aware.

## 2015-05-22 NOTE — Telephone Encounter (Signed)
Patient made aware via MyChart.   Awaiting response.

## 2015-05-22 NOTE — Telephone Encounter (Signed)
Patient requested CT to R/O issues.   States that she does not need refill at this time.   CT ordered.

## 2015-05-22 NOTE — Telephone Encounter (Signed)
Call placed to patient. LMTRC.  

## 2015-05-24 ENCOUNTER — Ambulatory Visit (HOSPITAL_COMMUNITY)
Admission: RE | Admit: 2015-05-24 | Discharge: 2015-05-24 | Disposition: A | Discharge status: Home / Self Care | Payer: 59 | Source: Ambulatory Visit | Attending: Family Medicine | Admitting: Family Medicine

## 2015-05-24 ENCOUNTER — Other Ambulatory Visit (HOSPITAL_COMMUNITY): Payer: Self-pay | Admitting: Psychiatry

## 2015-05-24 DIAGNOSIS — R1031 Right lower quadrant pain: Secondary | ICD-10-CM | POA: Diagnosis not present

## 2015-05-24 DIAGNOSIS — F3162 Bipolar disorder, current episode mixed, moderate: Secondary | ICD-10-CM

## 2015-05-24 DIAGNOSIS — R1084 Generalized abdominal pain: Secondary | ICD-10-CM

## 2015-05-24 MED ORDER — IOHEXOL 300 MG/ML  SOLN
100.0000 mL | Freq: Once | INTRAMUSCULAR | Status: AC | PRN
  Administered 2015-05-24: 100 mL via INTRAVENOUS

## 2015-05-24 NOTE — Telephone Encounter (Signed)
printed

## 2015-05-24 NOTE — Telephone Encounter (Signed)
Informed Dr. Harrington Challenger that pt only needed to know her lab results. Per Dr. Harrington Challenger to let pt know the results. Called pt and she showed understanding

## 2015-05-25 ENCOUNTER — Encounter (HOSPITAL_COMMUNITY): Payer: Self-pay | Admitting: Psychiatry

## 2015-05-25 ENCOUNTER — Ambulatory Visit (INDEPENDENT_AMBULATORY_CARE_PROVIDER_SITE_OTHER): Payer: 59 | Admitting: Psychiatry

## 2015-05-25 VITALS — BP 113/81 | HR 95 | Ht 64.0 in | Wt 145.6 lb

## 2015-05-25 DIAGNOSIS — F3160 Bipolar disorder, current episode mixed, unspecified: Secondary | ICD-10-CM | POA: Diagnosis not present

## 2015-05-25 DIAGNOSIS — F3162 Bipolar disorder, current episode mixed, moderate: Secondary | ICD-10-CM

## 2015-05-25 MED ORDER — ARIPIPRAZOLE 2 MG PO TABS: 2.0000 mg | ORAL_TABLET | Freq: Every day | ORAL | Status: DC

## 2015-05-25 MED ORDER — VILAZODONE HCL 40 MG PO TABS: 40.0000 mg | ORAL_TABLET | Freq: Every day | ORAL | Status: DC

## 2015-05-25 MED ORDER — CARBAMAZEPINE 200 MG PO TABS: 200.0000 mg | ORAL_TABLET | Freq: Two times a day (BID) | ORAL | Status: DC
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
## 2015-05-28 ENCOUNTER — Encounter: Payer: Self-pay | Admitting: Family Medicine

## 2015-05-28 MED ORDER — POLYETHYLENE GLYCOL 3350 17 GM/SCOOP PO POWD: 17.0000 g | Freq: Two times a day (BID) | ORAL | Status: DC | PRN

## 2015-05-31 ENCOUNTER — Telehealth (HOSPITAL_COMMUNITY): Payer: Self-pay | Admitting: *Deleted

## 2015-05-31 NOTE — Telephone Encounter (Signed)
noted 

## 2015-05-31 NOTE — Telephone Encounter (Signed)
Pt called stating the new medication (Tegratol) is making her really itchy. Per pt, she go not have rash but it's just making her really itchy. Per pt, she's at work and can not concentrate at all. Per pt, she is going back on Lithium and will just have to deal with her being tired.

## 2015-06-22 ENCOUNTER — Ambulatory Visit (HOSPITAL_COMMUNITY): Payer: Self-pay | Admitting: Psychiatry

## 2015-07-16 ENCOUNTER — Encounter: Payer: Self-pay | Admitting: Family Medicine

## 2015-07-16 ENCOUNTER — Ambulatory Visit (INDEPENDENT_AMBULATORY_CARE_PROVIDER_SITE_OTHER): Payer: 59 | Admitting: Family Medicine

## 2015-07-16 DIAGNOSIS — M545 Low back pain: Secondary | ICD-10-CM

## 2015-07-16 MED ORDER — KETOROLAC TROMETHAMINE 60 MG/2ML IM SOLN
60.0000 mg | Freq: Once | INTRAMUSCULAR | Status: AC
  Administered 2015-07-16: 60 mg via INTRAMUSCULAR

## 2015-07-16 MED ORDER — HYDROCODONE-ACETAMINOPHEN 5-325 MG PO TABS: 1.0000 | ORAL_TABLET | Freq: Four times a day (QID) | ORAL | Status: DC | PRN

## 2015-07-27 ENCOUNTER — Ambulatory Visit (INDEPENDENT_AMBULATORY_CARE_PROVIDER_SITE_OTHER): Payer: 59 | Admitting: Psychiatry

## 2015-07-27 ENCOUNTER — Encounter (HOSPITAL_COMMUNITY): Payer: Self-pay | Admitting: Psychiatry

## 2015-07-27 VITALS — BP 136/93 | HR 100 | Ht 64.0 in | Wt 149.4 lb

## 2015-07-27 DIAGNOSIS — F3162 Bipolar disorder, current episode mixed, moderate: Secondary | ICD-10-CM

## 2015-07-27 MED ORDER — CLONAZEPAM 0.5 MG PO TABS: ORAL_TABLET | ORAL | Status: DC

## 2015-07-27 MED ORDER — ARIPIPRAZOLE 2 MG PO TABS: 2.0000 mg | ORAL_TABLET | Freq: Every day | ORAL | Status: DC

## 2015-07-27 MED ORDER — VILAZODONE HCL 40 MG PO TABS: 40.0000 mg | ORAL_TABLET | Freq: Every day | ORAL | Status: DC
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
## 2015-07-30 ENCOUNTER — Encounter: Payer: Self-pay | Admitting: Family Medicine

## 2015-07-30 MED ORDER — METHYLPREDNISOLONE 4 MG PO TABS: ORAL_TABLET | ORAL | Status: DC

## 2015-07-31 ENCOUNTER — Encounter: Payer: Self-pay | Admitting: Family Medicine

## 2015-07-31 ENCOUNTER — Other Ambulatory Visit (HOSPITAL_COMMUNITY): Payer: Self-pay | Admitting: Psychiatry

## 2015-07-31 ENCOUNTER — Telehealth (HOSPITAL_COMMUNITY): Payer: Self-pay | Admitting: *Deleted

## 2015-07-31 ENCOUNTER — Ambulatory Visit (INDEPENDENT_AMBULATORY_CARE_PROVIDER_SITE_OTHER): Payer: 59 | Admitting: Family Medicine

## 2015-07-31 VITALS — BP 136/66 | HR 72 | Temp 98.4°F | Resp 14 | Ht 64.0 in | Wt 149.0 lb

## 2015-07-31 DIAGNOSIS — M5126 Other intervertebral disc displacement, lumbar region: Secondary | ICD-10-CM

## 2015-07-31 DIAGNOSIS — M5416 Radiculopathy, lumbar region: Secondary | ICD-10-CM

## 2015-07-31 DIAGNOSIS — M5136 Other intervertebral disc degeneration, lumbar region: Secondary | ICD-10-CM

## 2015-07-31 DIAGNOSIS — M5417 Radiculopathy, lumbosacral region: Secondary | ICD-10-CM | POA: Diagnosis not present

## 2015-07-31 MED ORDER — FLUOXETINE HCL 40 MG PO CAPS: 40.0000 mg | ORAL_CAPSULE | Freq: Every day | ORAL | Status: DC

## 2015-07-31 MED ORDER — PREGABALIN 75 MG PO CAPS: 75.0000 mg | ORAL_CAPSULE | Freq: Two times a day (BID) | ORAL | Status: DC

## 2015-07-31 NOTE — Telephone Encounter (Signed)
Pt called stating she is having really bad side effects and she thinks it is her Viibryd. Per pt woke up this morning and could not breathe, and had a panic attack and has been up most of the night. Per pt, she would like to know what to do.

## 2015-07-31 NOTE — Assessment & Plan Note (Signed)
Known Disc bulge with nerve root irritation, now with symptoms higher in lumbar area, concern for worsening disc disease and impingment, obtain updated MRI to guide Treatment, plan for Epidural injections Will complete steroids, trial of Lyrica for the radicular pain, continue pain meds

## 2015-07-31 NOTE — Patient Instructions (Signed)
MRI of Lumbar SPine Complete steroids Referral to Spine specialist  Try Lyrica  F/U pending results

## 2015-07-31 NOTE — Telephone Encounter (Signed)
Pt will start Prozac 40 mg daily and taper off Viibryd

## 2015-07-31 NOTE — Telephone Encounter (Signed)
VIIBRYD, IF SHE IS A FEW HOURS LATE TAKING IT SHE HAS BAD SIDE EFFECTS, SHE WAS HAVING BACK PAINS AND FORGOT TO TAKE IT.   WHEN SHE WENT TO SLEEP, SHE WOKE UP COULD NOT BREATHE, PANIC ATTACK, AWAKE MOST OF THE NIGHT.

## 2015-07-31 NOTE — Progress Notes (Signed)
Patient ID: [name], female   DOB: 14-Apr-1981, 34 y.o.   MRN: [id number]   Subjective:    Patient ID: [name], female    DOB: 02/28/1981, 34 y.o.   MRN: [id number]  Patient presents for B Leg Pain  patient here with acute on chronic back pain. She's had multiple episodes of her chronic back pain this year. For the past couple weeks she's had pain that is radiating down into her right leg will be sharp pain and then a heaviness which throbs in her thigh and buttocks area. She feels like her back locks up on her at times. She is currently on Medrol Dosepak but has not noticed much difference she also had Toradol injection a few weeks ago but that did not help. She's taken her chronic pain medications which helped some but is not helping the throbbing and shooting pain down her leg. She had MRI in the past for one to hold off on epidural injection she is ready to proceed with this now. He cannot be as active as she would like to be because of her pain.  Impression: 2013 No significant findings at L1-2, L2-3, L3-4 or L4-5. Mild facet arthropathy noted. There is a small synovial cyst on the left at L4-5 projecting posteriorly.  L5-S1: Shallow broad-based right paracentral disc protrusion with mild mass effect on the right side of the thecal sac and possible irritation of the right S1 nerve root. The exiting L5 nerve roots are normal. Mild facet hypertrophy, right greater than left.  Review Of Systems:  GEN- denies fatigue, fever, weight loss,weakness, recent illness CVS- denies chest pain, palpitations RESP- denies SOB, cough, wheeze ABD- denies N/V, change in stools, abd pain GU- denies dysuria, hematuria, dribbling, NO incontinence MSK- +joint pain, muscle aches, injury        Objective:    BP 136/66 mmHg  Pulse 72  Temp(Src) 98.4 F (36.9 C) (Oral)  Resp 14  Ht 5\' 4"  (1.626 m)  Wt 149 lb (67.586 kg)  BMI 25.56 kg/m2 GEN- NAD, alert and oriented x3 MSK- TTP lumbar  spine, +paraspinal spasm, decreased ROM, +SLR Right side, neg SLR Left,  Neuro-normal tone LE, DTR symmetric, sensation in tact, motor 4/5 RLE compared to Left (? Pain) Ext- no edema         Assessment & Plan:      Problem List Items Addressed This Visit    Lumbar back pain with radiculopathy affecting right lower extremity    Known Disc bulge with nerve root irritation, now with symptoms higher in lumbar area, concern for worsening disc disease and impingment, obtain updated MRI to guide Treatment, plan for Epidural injections Will complete steroids, trial of Lyrica for the radicular pain, continue pain meds      Relevant Medications   pregabalin (LYRICA) 75 MG capsule   Bulging lumbar disc - Primary      Note: This dictation was prepared with Dragon dictation along with smaller phrase technology. Any transcriptional errors that result from this process are unintentional.

## 2015-08-01 NOTE — Telephone Encounter (Signed)
Message was sent to provider. 

## 2015-08-03 ENCOUNTER — Ambulatory Visit (HOSPITAL_COMMUNITY)
Admission: RE | Admit: 2015-08-03 | Discharge: 2015-08-03 | Disposition: A | Discharge status: Home / Self Care | Payer: 59 | Source: Ambulatory Visit | Attending: Family Medicine | Admitting: Family Medicine

## 2015-08-03 ENCOUNTER — Other Ambulatory Visit: Payer: Self-pay | Admitting: *Deleted

## 2015-08-03 DIAGNOSIS — M545 Low back pain: Secondary | ICD-10-CM | POA: Diagnosis not present

## 2015-08-03 DIAGNOSIS — M5127 Other intervertebral disc displacement, lumbosacral region: Secondary | ICD-10-CM | POA: Insufficient documentation

## 2015-08-03 DIAGNOSIS — M5126 Other intervertebral disc displacement, lumbar region: Secondary | ICD-10-CM

## 2015-08-03 DIAGNOSIS — M5136 Other intervertebral disc degeneration, lumbar region: Secondary | ICD-10-CM

## 2015-08-03 DIAGNOSIS — M5416 Radiculopathy, lumbar region: Secondary | ICD-10-CM

## 2015-08-03 MED ORDER — PREGABALIN 75 MG PO CAPS: 75.0000 mg | ORAL_CAPSULE | Freq: Two times a day (BID) | ORAL | Status: DC

## 2015-08-17 ENCOUNTER — Other Ambulatory Visit (HOSPITAL_COMMUNITY): Payer: Self-pay

## 2015-08-30 ENCOUNTER — Telehealth: Payer: Self-pay | Admitting: *Deleted

## 2015-08-30 MED ORDER — HYDROCODONE-ACETAMINOPHEN 5-325 MG PO TABS: 1.0000 | ORAL_TABLET | Freq: Four times a day (QID) | ORAL | Status: DC | PRN

## 2015-08-30 NOTE — Telephone Encounter (Signed)
Received following message from patient:  "Im scheduled for the epidural Nov 23rd. He said it may or may not help but Im hopeful. He said he could do 3 shots before he would have to do a discectomy (?) I have been having to take the hydrocodone for my back daily/bid to be able to stand up straight and not hunch over. I have a few left but if I need anymore will you be able to write them or should I contact ortho. They asked me what I was taking but didn't mention refilling them. The lyrica works for my leg pain, thank goodness.  Thanks!  MD reviewed and approved refill on Hydrocodone.   Prescription printed and patient made aware to come to office to pick up on 08/31/2015.

## 2015-09-18 ENCOUNTER — Encounter: Payer: Self-pay | Admitting: Family Medicine

## 2015-09-18 ENCOUNTER — Ambulatory Visit (INDEPENDENT_AMBULATORY_CARE_PROVIDER_SITE_OTHER): Payer: 59 | Admitting: Family Medicine

## 2015-09-18 VITALS — BP 128/64 | HR 70 | Temp 98.9°F | Resp 14 | Ht 64.0 in | Wt 143.0 lb

## 2015-09-18 DIAGNOSIS — G43909 Migraine, unspecified, not intractable, without status migrainosus: Secondary | ICD-10-CM | POA: Insufficient documentation

## 2015-09-18 DIAGNOSIS — G43109 Migraine with aura, not intractable, without status migrainosus: Secondary | ICD-10-CM | POA: Diagnosis not present

## 2015-09-18 MED ORDER — TOPIRAMATE 25 MG PO TABS: ORAL_TABLET | ORAL | Status: DC

## 2015-09-18 MED ORDER — KETOROLAC TROMETHAMINE 60 MG/2ML IM SOLN
60.0000 mg | Freq: Once | INTRAMUSCULAR | Status: AC
  Administered 2015-09-18: 60 mg via INTRAMUSCULAR

## 2015-09-18 MED ORDER — SUMATRIPTAN SUCCINATE 100 MG PO TABS: ORAL_TABLET | ORAL | Status: DC

## 2015-09-18 MED ORDER — PROMETHAZINE HCL 12.5 MG PO TABS: 12.5000 mg | ORAL_TABLET | Freq: Three times a day (TID) | ORAL | Status: DC | PRN

## 2015-09-18 NOTE — Patient Instructions (Signed)
Start topamax at bedtime with 1 tablet, increase to 2 tablets after 1 week Phenergan as needed for nausea Imitrex is best at start of headache Continue all other medications F/U as needed

## 2015-09-18 NOTE — Progress Notes (Signed)
Patient ID: [name], female   DOB: October 14, 1981, 34 y.o.   MRN: [id number]   Subjective:    Patient ID: [name], female    DOB: 1981/09/07, 34 y.o.   MRN: [id number]  Patient presents for Frequent HA  here with migraine headaches. She has history of migraines which started about 2 years ago. This typically only once a month therefore she did not ask for anything to help with the pain. She will usually take over-the-counter medications. For muscle relaxer. Past couple months however she has been having migraines 1-2 times a week which are very severe. She tends to get in or before where her vision seems glassy and wavy and then the headache sets in. The headache is usually in the bilateral temples and then moves down the back of her neck which was where most of her discomfort is. She tried taking a muscle relaxer recently and it helped minimally. She denies any change on her job she denies any new stressors in the home. She does not drink a lot of caffeine and she tries to stay hydrated. With regards to her previous back pain this has been controlled with the Lyrica. +nausea and photophobic   Review Of Systems:  GEN- denies fatigue, fever, weight loss,weakness, recent illness HEENT- denies eye drainage, change in vision, nasal discharge, CVS- denies chest pain, palpitations RESP- denies SOB, cough, wheeze ABD- denies N/V, change in stools, abd pain GU- denies dysuria, hematuria, dribbling, incontinence MSK- denies joint pain, muscle aches, injury Neuro- + headache, Denies dizziness, syncope, seizure activity       Objective:    BP 128/64 mmHg  Pulse 70  Temp(Src) 98.9 F (37.2 C) (Oral)  Resp 14  Ht 5\' 4"  (1.626 m)  Wt 143 lb (64.864 kg)  BMI 24.53 kg/m2 GEN- NAD, alert and oriented x3 HEENT- PERRL, EOMI, non injected sclera, pink conjunctiva, MMM, oropharynx clear Neck- Supple, no thyromegaly CVS- RRR, no murmur RESP-CTAB Neuro-CNII-XII intact, no focal deficits Pulses-  Radial- 2+        Assessment & Plan:      Problem List Items Addressed This Visit    Migraines - Primary    Given toradol in office, pt unable to have phenergan due to her work today *Topamax 25 mg at bedtime will titrate up to 50 mg. He'll also given her Imitrex to be used at the beginning of the headache. She also has Phenergan for nausea gets associated. No focal neurological deficits to suggest imaging.      Relevant Medications   SUMAtriptan (IMITREX) 100 MG tablet   topiramate (TOPAMAX) 25 MG tablet   ketorolac (TORADOL) injection 60 mg (Completed)      Note: This dictation was prepared with Dragon dictation along with smaller phrase technology. Any transcriptional errors that result from this process are unintentional.

## 2015-09-18 NOTE — Assessment & Plan Note (Addendum)
Given toradol in office, pt unable to have phenergan due to her work today Topamax 25 mg at bedtime will titrate up to 50 mg. He'll also given her Imitrex to be used at the beginning of the headache. She also has Phenergan for nausea gets associated. No focal neurological deficits to suggest imaging.

## 2015-09-19 ENCOUNTER — Encounter: Payer: Self-pay | Admitting: Family Medicine

## 2015-10-02 ENCOUNTER — Telehealth: Payer: Self-pay | Admitting: *Deleted

## 2015-10-02 NOTE — Telephone Encounter (Signed)
Received call from patient.   Reports that she had spinal nerve block. States that the injection has worn off, but for the (2) weeks that it was effective, she had severe HA.   States that she will continue with symptom management with Lyrica and Hydrocodone, and will possibly schedule surgery for next summer.   MD to be made aware.

## 2015-10-02 NOTE — Telephone Encounter (Signed)
noted 

## 2015-10-15 ENCOUNTER — Emergency Department (HOSPITAL_COMMUNITY)
Admission: EM | Admit: 2015-10-15 | Discharge: 2015-10-15 | Disposition: A | Discharge status: Home / Self Care | Payer: 59 | Source: Home / Self Care | Attending: Emergency Medicine | Admitting: Emergency Medicine

## 2015-10-15 ENCOUNTER — Encounter (HOSPITAL_COMMUNITY): Payer: Self-pay | Admitting: Emergency Medicine

## 2015-10-15 DIAGNOSIS — J40 Bronchitis, not specified as acute or chronic: Secondary | ICD-10-CM | POA: Diagnosis not present

## 2015-10-15 MED ORDER — HYDROCODONE-HOMATROPINE 5-1.5 MG/5ML PO SYRP: 5.0000 mL | ORAL_SOLUTION | Freq: Four times a day (QID) | ORAL | Status: DC | PRN

## 2015-10-15 MED ORDER — ALBUTEROL SULFATE HFA 108 (90 BASE) MCG/ACT IN AERS: 2.0000 | INHALATION_SPRAY | RESPIRATORY_TRACT | Status: DC | PRN

## 2015-10-15 MED ORDER — PREDNISONE 50 MG PO TABS: ORAL_TABLET | ORAL | Status: DC

## 2015-10-15 MED ORDER — AZITHROMYCIN 250 MG PO TABS: ORAL_TABLET | ORAL | Status: DC

## 2015-10-15 NOTE — ED Notes (Signed)
Cough and congestion for 2 weeks.  Symptoms are getting worse.  Reports non-productive cough and feels chest congestion, but not able to cough congestion up from lungs.  Fever 99.5 at night.  Patient reports wheezing at night

## 2015-10-15 NOTE — Discharge Instructions (Signed)
You have bronchitis. Take azithromycin and prednisone as prescribed. Use hycodan as needed for cough.  Do not drive while taking this medicine. Use the albuterol every 4 hours as needed for wheezing or cough. You should see improvement in the next 3-5 days. If you develop fevers, difficulty breathing, or are just not getting better, please come back or go to the emergency room.

## 2015-10-15 NOTE — ED Provider Notes (Signed)
CSN: [id number]     Arrival date & time 10/15/15  1301 History   First MD Initiated Contact with Patient 10/15/15 1319     Chief Complaint  Patient presents with  . Cough   (Consider location/radiation/quality/duration/timing/severity/associated sxs/prior Treatment) HPI  He is a 34 year old woman here for evaluation of cough. She states she's had cough and chest congestion for the last 2 weeks. It seems to be gradually getting worse. She reports mild nasal congestion and mild left ear pain as well. She has started to hear wheezing at night. She reports low-grade fevers at night as well. No nausea or vomiting. Her appetite is normal. She will have coughing spells that make it hard for her to catch her breath.  Past Medical History  Diagnosis Date  . 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 Depression   . HSIL on Pap smear of cervix 2013    [place]  . Bipolar disorder, mixed ([place])   . Bipolar 1 disorder, mixed ([place])    Past Surgical History  Procedure Laterality Date  . Cholecystectomy    . Tubal ligation     Family History  Problem Relation Age of Onset  . Cancer Mother     lung  . Heart disease Mother   . Alcohol abuse Mother   . Bipolar disorder Mother   . Cancer Father     prostate    Social History  Substance Use Topics  . Smoking status: Current Every Day Smoker -- 1.00 packs/day    Types: Cigarettes  . Smokeless tobacco: Never Used  . Alcohol Use: 0.0 oz/week    0 Standard drinks or equivalent per week     Comment: socially only less than monthly   OB History    No data available     Review of Systems As in history of present illness Allergies  Lamictal  Home Medications   Prior to Admission medications   Medication Sig Start Date End Date Taking? Authorizing Provider  guaiFENesin (MUCINEX) 600 MG 12 hr tablet Take by mouth 2 (two) times daily.   Yes Historical Provider, MD  albuterol (PROVENTIL HFA;VENTOLIN HFA) 108  (90 BASE) MCG/ACT inhaler Inhale 2 puffs into the lungs every 4 (four) hours as needed for wheezing or shortness of breath. 10/15/15   Melony Overly, MD  ARIPiprazole (ABILIFY) 2 MG tablet Take 1 tablet (2 mg total) by mouth daily. 07/27/15   Cloria Spring, MD  azithromycin (ZITHROMAX Z-PAK) 250 MG tablet Take 2 pills today, then 1 pill daily until gone. 10/15/15   Melony Overly, MD  clonazePAM (KLONOPIN) 0.5 MG tablet Take one twice a day (PRN) 07/27/15   Cloria Spring, MD  FLUoxetine (PROZAC) 40 MG capsule Take 1 capsule (40 mg total) by mouth daily. 07/31/15 07/30/16  Cloria Spring, MD  HYDROcodone-acetaminophen (NORCO) 5-325 MG tablet Take 1 tablet by mouth every 6 (six) hours as needed for moderate pain. 08/30/15   Alycia Rossetti, MD  HYDROcodone-homatropine [facility]) 5-1.5 MG/5ML syrup Take 5 mLs by mouth every 6 (six) hours as needed for cough. 10/15/15   Melony Overly, MD  polyethylene glycol powder (GLYCOLAX/MIRALAX) powder Take 17 g by mouth 2 (two) times daily as needed. 05/28/15   Alycia Rossetti, MD  predniSONE (DELTASONE) 50 MG tablet Take 1 pill daily for 5 days. 10/15/15   Melony Overly, MD  promethazine (PHENERGAN) 12.5 MG tablet  Take 1 tablet (12.5 mg total) by mouth every 8 (eight) hours as needed for nausea or vomiting. 09/18/15   Alycia Rossetti, MD  SUMAtriptan (IMITREX) 100 MG tablet May repeat in 2 hours if headache persists or recurs. 09/18/15   Alycia Rossetti, MD  topiramate (TOPAMAX) 25 MG tablet Take 1-2 tablets at bedtime for migraine 09/18/15   Alycia Rossetti, MD   Meds Ordered and Administered this Visit  Medications - No data to display  BP 110/84 mmHg  Pulse 85  Temp(Src) 98.7 F (37.1 C) (Oral)  Resp 16  SpO2 98%  LMP 09/18/2015 No data found.   Physical Exam  Constitutional: She is oriented to person, place, and time. She appears well-developed and well-nourished. No distress.  HENT:  Nose: Nose normal.  Mouth/Throat: Oropharynx is clear and moist.  No oropharyngeal exudate.  Right TM is normal. Left TM is retracted.  Neck: Neck supple.  Cardiovascular: Normal rate, regular rhythm and normal heart sounds.   No murmur heard. Pulmonary/Chest: Effort normal. No respiratory distress. She has no wheezes. She has no rales.  Diffuse coarse breath sounds  Lymphadenopathy:    She has no cervical adenopathy.  Neurological: She is alert and oriented to person, place, and time.    ED Course  Procedures (including critical care time)  Labs Review Labs Reviewed - No data to display  Imaging Review No results found.    MDM   1. Bronchitis    Treatment with prednisone and azithromycin. Albuterol as needed for wheezing. Hycodan as needed for cough. Return precautions reviewed.    Melony Overly, MD 10/15/15 1336

## 2015-10-23 MED FILL — clonazePAM 0.5 MG TABS: 0.5 | 30 days supply | Qty: 60 | Fill #0

## 2015-10-26 ENCOUNTER — Encounter (HOSPITAL_COMMUNITY): Payer: Self-pay | Admitting: Psychiatry

## 2015-10-26 ENCOUNTER — Ambulatory Visit (INDEPENDENT_AMBULATORY_CARE_PROVIDER_SITE_OTHER): Payer: 59 | Admitting: Psychiatry

## 2015-10-26 VITALS — BP 99/76 | HR 111 | Ht 64.0 in | Wt 136.8 lb

## 2015-10-26 DIAGNOSIS — F3162 Bipolar disorder, current episode mixed, moderate: Secondary | ICD-10-CM

## 2015-10-26 MED ORDER — ARIPIPRAZOLE 2 MG PO TABS: 2.0000 mg | ORAL_TABLET | Freq: Every day | ORAL | Status: DC

## 2015-10-26 MED ORDER — PAROXETINE HCL 10 MG PO TABS: 10.0000 mg | ORAL_TABLET | Freq: Every day | ORAL | Status: DC

## 2015-10-26 MED ORDER — CLONAZEPAM 0.5 MG PO TABS: ORAL_TABLET | ORAL | Status: DC

## 2015-10-26 MED FILL — PARoxetine HCL 10 MG TABS: 10 | 30 days supply | Qty: 30 | Fill #0

## 2015-10-26 MED FILL — ARIPiprazole 2 MG TABS: 2 | 30 days supply | Qty: 30 | Fill #0
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
## 2015-11-30 ENCOUNTER — Encounter: Payer: Self-pay | Admitting: Family Medicine

## 2015-11-30 MED ORDER — TOPIRAMATE 25 MG PO TABS: ORAL_TABLET | ORAL | Status: DC

## 2015-11-30 MED FILL — TOPIRAMATE 25 MG TABLET: 25 | 30 days supply | Qty: 60 | Fill #0

## 2016-01-10 MED FILL — ARIPiprazole 2 MG TABS: 2 | 30 days supply | Qty: 30 | Fill #1

## 2016-01-21 ENCOUNTER — Other Ambulatory Visit: Payer: Self-pay | Admitting: Family Medicine

## 2016-01-21 MED ORDER — HYDROCODONE-ACETAMINOPHEN 5-325 MG PO TABS: 1.0000 | ORAL_TABLET | Freq: Four times a day (QID) | ORAL | Status: DC | PRN

## 2016-01-25 ENCOUNTER — Ambulatory Visit (HOSPITAL_COMMUNITY): Payer: Self-pay | Admitting: Psychiatry

## 2016-01-28 ENCOUNTER — Encounter: Payer: Self-pay | Admitting: Family Medicine

## 2016-01-29 ENCOUNTER — Ambulatory Visit (INDEPENDENT_AMBULATORY_CARE_PROVIDER_SITE_OTHER): Payer: 59 | Admitting: Family Medicine

## 2016-01-29 ENCOUNTER — Encounter: Payer: Self-pay | Admitting: Family Medicine

## 2016-01-29 VITALS — BP 124/62 | HR 72 | Temp 98.7°F | Resp 14 | Ht 64.0 in | Wt 135.0 lb

## 2016-01-29 DIAGNOSIS — F319 Bipolar disorder, unspecified: Secondary | ICD-10-CM

## 2016-01-29 DIAGNOSIS — F988 Other specified behavioral and emotional disorders with onset usually occurring in childhood and adolescence: Secondary | ICD-10-CM

## 2016-01-29 DIAGNOSIS — F909 Attention-deficit hyperactivity disorder, unspecified type: Secondary | ICD-10-CM

## 2016-01-29 MED ORDER — MODAFINIL 100 MG PO TABS: 100.0000 mg | ORAL_TABLET | Freq: Every day | ORAL | Status: DC

## 2016-01-29 NOTE — Patient Instructions (Signed)
Try the Provigil once a day  Continue Abilify F/U 2 months

## 2016-01-29 NOTE — Progress Notes (Signed)
Patient ID: [name], female   DOB: 11-06-1980, 35 y.o.   MRN: [id number]    Subjective:    Patient ID: [name], female    DOB: [birth year], 35 y.o.   MRN: [id number]  Patient presents for Medication Management Patient here discussed medications. She has history of bipolar disorder mostly depressed type. She has been followed by psychiatrist for the past couple years but she feels like her psychiatrist not helping with her other symptoms. She's had multiple medication changes over the past 3 years. She feels like her mood is stable with Abilify note she was last placed on Paxil a few months ago to see if this will help her concentration but it did not. She is actually been on lithium Vibryd Prozac Wellbutrin to name a few She is unable to concentrate at work she's had ADD symptoms since high school. 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 There is some frustrations from her provider as well because she is not doing her job. She also has difficulty in the home setting she cannot concentrate to help her children with her homework concentrated on multiple past she gets very frustrated and oftentimes can even think of the word to the answers that she needs to give. She is arm ever being on any stimulants in the past but was on more of the bipolar medications. She is also fatigued during the day which also makes it difficult for her to concentrate.   Review Of Systems:  GEN- denies fatigue, fever, weight loss,weakness, recent illness HEENT- denies eye drainage, change in vision, nasal discharge, CVS- denies chest pain, palpitations RESP- denies SOB, cough, wheeze ABD- denies N/V, change in stools, abd pain GU- denies dysuria, hematuria, dribbling, incontinence MSK- denies joint pain, muscle aches, injury Neuro- denies headache, dizziness, syncope, seizure activity       Objective:    BP 124/62 mmHg  Pulse 72  Temp(Src)  98.7 F (37.1 C) (Oral)  Resp 14  Ht 5\' 4"  (1.626 m)  Wt 135 lb (61.236 kg)  BMI 23.16 kg/m2 GEN- NAD, alert and oriented x3 Psych- pleasant, normal affect and mood, no hallucinations, normal thought process         Assessment & Plan:      Problem List Items Addressed This Visit    Bipolar I disorder ([place]) - Primary   ADD (attention deficit disorder)    History of attention deficit. This is very difficult as she has a bipolar based on the studies that I reviewed stable medication could cause mania and there is no way to predict this. We also looked at other alternatives she was interested in trying Provigil to see if this would help with her fatigue and keep her awake which within a term likely help her concentration during the day. They're only case reports regarding the Provigil use and they were positive in patients who have bipolar. I will try her on Provigil 100 mg once a day she will continue her Abilify at the current dose. She's to call me for any significant side effects or to stop the medication immediately if needed.         Note: This dictation was prepared with Dragon dictation along with smaller phrase technology. Any transcriptional errors that result from this process are unintentional.

## 2016-01-29 NOTE — Assessment & Plan Note (Signed)
History of attention deficit. This is very difficult as she has a bipolar based on the studies that I reviewed stable medication could cause mania and there is no way to predict this. We also looked at other alternatives she was interested in trying Provigil to see if this would help with her fatigue and keep her awake which within a term likely help her concentration during the day. They're only case reports regarding the Provigil use and they were positive in patients who have bipolar. I will try her on Provigil 100 mg once a day she will continue her Abilify at the current dose. She's to call me for any significant side effects or to stop the medication immediately if needed.

## 2016-02-08 ENCOUNTER — Encounter: Payer: Self-pay | Admitting: Family Medicine

## 2016-02-29 DIAGNOSIS — H5203 Hypermetropia, bilateral: Secondary | ICD-10-CM | POA: Diagnosis not present

## 2016-03-03 ENCOUNTER — Telehealth: Payer: Self-pay | Admitting: Family Medicine

## 2016-03-03 MED ORDER — MODAFINIL 200 MG PO TABS: 200.0000 mg | ORAL_TABLET | Freq: Every day | ORAL | Status: DC

## 2016-03-03 MED FILL — MODAFINIL 200 MG TABLET: 200 | 30 days supply | Qty: 30 | Fill #0

## 2016-03-03 NOTE — Telephone Encounter (Signed)
Patient aware per MyChart.   Medication called to pharmacy.

## 2016-03-03 NOTE — Telephone Encounter (Signed)
I didn't take the provigil for a couple weeks because it made me too tired. [name] even begged me not to because she was having to pick up my slack) Before I gave up on it though I decided to try to take 2 over the weekend to see if would help. The 200mg  didn't make me sleepy and did actually help me concentrate on what I was doing more. I took 2 this am and doing better. Running low on the rx though since it was only 30. If you are ok with me taking 2 I need a refill sent to [facility]. I do have an appt with a new psych in [facility] May 26th. (Dr Darleene Cleaver).     Okay to change to 200mg  once a day, let pt know

## 2016-03-14 DIAGNOSIS — F902 Attention-deficit hyperactivity disorder, combined type: Secondary | ICD-10-CM | POA: Diagnosis not present

## 2016-03-19 MED FILL — clonazePAM 0.5 MG TABS: 0.5 | 30 days supply | Qty: 60 | Fill #0

## 2016-03-26 DIAGNOSIS — F411 Generalized anxiety disorder: Secondary | ICD-10-CM | POA: Diagnosis not present

## 2016-03-26 DIAGNOSIS — F902 Attention-deficit hyperactivity disorder, combined type: Secondary | ICD-10-CM | POA: Diagnosis not present

## 2016-03-26 DIAGNOSIS — F319 Bipolar disorder, unspecified: Secondary | ICD-10-CM | POA: Diagnosis not present

## 2016-04-04 ENCOUNTER — Ambulatory Visit: Payer: 59 | Admitting: Family Medicine

## 2016-04-18 DIAGNOSIS — F411 Generalized anxiety disorder: Secondary | ICD-10-CM | POA: Diagnosis not present

## 2016-04-18 DIAGNOSIS — F319 Bipolar disorder, unspecified: Secondary | ICD-10-CM | POA: Diagnosis not present

## 2016-04-18 DIAGNOSIS — F902 Attention-deficit hyperactivity disorder, combined type: Secondary | ICD-10-CM | POA: Diagnosis not present

## 2016-04-23 MED FILL — VENLAFAXINE HCL ER 75 MG CA: 75 | 30 days supply | Qty: 30 | Fill #0

## 2016-04-23 MED FILL — AMPHETAMINE SALTS 15 MG TAB: 15 | 30 days supply | Qty: 30 | Fill #0

## 2016-05-20 ENCOUNTER — Encounter: Payer: Self-pay | Admitting: Family Medicine

## 2016-05-21 ENCOUNTER — Encounter: Payer: Self-pay | Admitting: Family Medicine

## 2016-05-21 ENCOUNTER — Ambulatory Visit (INDEPENDENT_AMBULATORY_CARE_PROVIDER_SITE_OTHER): Payer: 59 | Admitting: Family Medicine

## 2016-05-21 VITALS — BP 112/68 | HR 98 | Temp 99.1°F | Resp 14 | Ht 65.0 in | Wt 130.0 lb

## 2016-05-21 DIAGNOSIS — J029 Acute pharyngitis, unspecified: Secondary | ICD-10-CM

## 2016-05-21 DIAGNOSIS — J069 Acute upper respiratory infection, unspecified: Secondary | ICD-10-CM | POA: Diagnosis not present

## 2016-05-21 DIAGNOSIS — H109 Unspecified conjunctivitis: Secondary | ICD-10-CM | POA: Diagnosis not present

## 2016-05-21 LAB — STREP GROUP A AG, W/REFLEX TO CULT: STREGTOCOCCUS GROUP A AG SCREEN: NOT DETECTED

## 2016-05-21 MED ORDER — POLYMYXIN B-TRIMETHOPRIM 10000-0.1 UNIT/ML-% OP SOLN: 1.0000 [drp] | Freq: Four times a day (QID) | OPHTHALMIC | 0 refills | Status: DC

## 2016-05-21 MED ORDER — HYDROCODONE-ACETAMINOPHEN 5-325 MG PO TABS: 1.0000 | ORAL_TABLET | Freq: Four times a day (QID) | ORAL | 0 refills | Status: DC | PRN

## 2016-05-21 NOTE — Progress Notes (Signed)
    Subjective:    Patient ID: [name], female    DOB: Feb 21, 1981, 35 y.o.   MRN: [id number]  Patient presents for Illness (fever, sore throat, body aches, eye drainage and irritation to R eye) Patient here mostly with concern for conjunctivitis over the weekend she started with sore throat and mild body aches fever 100.8F she began having a scratchy throat on Monday she did take a strep test at her jobs which was negative. Yesterday she noted redness and some drainage to her eye this more and she woke up with her eye matted shut on the right side. She took some Tylenol over-the-counter. She's had very minimal cough and no production. Denies any GI symptoms  Request refill on pain meds  Review Of Systems:  GEN- denies fatigue, fever, weight loss,weakness, recent illness HEENT- +eye drainage, change in vision, nasal discharge, CVS- denies chest pain, palpitations RESP- denies SOB, cough, wheeze ABD- denies N/V, change in stools, abd pain GU- denies dysuria, hematuria, dribbling, incontinence MSK- denies joint pain, muscle aches, injury Neuro- denies headache, dizziness, syncope, seizure activity       Objective:    BP 112/68 (BP Location: Left Arm, Patient Position: Sitting, Cuff Size: Normal)   Pulse 98   Temp 99.1 F (37.3 C) (Oral)   Resp 14   Ht 5\' 5"  (1.651 m)   Wt 130 lb (59 kg)   BMI 21.63 kg/m  GEN- NAD, alert and oriented x3 HEENT- PERRL, EOMI, + right injected sclera, white left sclera, injected right conjunctiva,  pink conjunctiva, MMM, oropharynx mild injection, TM clear bilat no effusion,  No  maxillary sinus tenderness,nares clear  Neck- Supple, no LAD CVS- RRR, no murmur RESP-CTAB EXT- No edema Pulses- Radial 2+         Assessment & Plan:      Problem List Items Addressed This Visit    None    Visit Diagnoses    Sore throat    -  Primary   Relevant Orders   STREP GROUP A AG, W/REFLEX TO CULT (Completed)   Viral URI       Viral illness but  also with conjunctivitis now, treat with polytrim, symptomatic care for viral URI, strep neg   Conjunctivitis of right eye          Note: This dictation was prepared with Dragon dictation along with smaller phrase technology. Any transcriptional errors that result from this process are unintentional.

## 2016-05-21 NOTE — Patient Instructions (Signed)
Eye drops Call or email if not better F/u as needed

## 2016-05-22 MED FILL — AMPHETAMINE SALTS 15 MG TAB: 15 | 30 days supply | Qty: 30 | Fill #0

## 2016-05-23 ENCOUNTER — Encounter: Payer: Self-pay | Admitting: Family Medicine

## 2016-05-23 MED ORDER — ERYTHROMYCIN 5 MG/GM OP OINT: 1.0000 "application " | TOPICAL_OINTMENT | Freq: Three times a day (TID) | OPHTHALMIC | 0 refills | Status: DC

## 2016-05-23 NOTE — Telephone Encounter (Signed)
Change to erythromycin ointment

## 2016-05-26 MED FILL — VENLAFAXINE HCL ER 75 MG CA: 75 | 30 days supply | Qty: 30 | Fill #1

## 2016-06-13 DIAGNOSIS — F411 Generalized anxiety disorder: Secondary | ICD-10-CM | POA: Diagnosis not present

## 2016-06-13 DIAGNOSIS — F902 Attention-deficit hyperactivity disorder, combined type: Secondary | ICD-10-CM | POA: Diagnosis not present

## 2016-06-13 DIAGNOSIS — F319 Bipolar disorder, unspecified: Secondary | ICD-10-CM | POA: Diagnosis not present

## 2016-06-18 MED FILL — VENLAFAXINE HCL ER 75 MG CA: 75 | 30 days supply | Qty: 30 | Fill #0

## 2016-06-20 MED FILL — AMPHETAMINE SALTS 15 MG TAB: 15 | 30 days supply | Qty: 30 | Fill #0

## 2016-06-28 ENCOUNTER — Telehealth: Payer: 59 | Admitting: Nurse Practitioner

## 2016-06-28 DIAGNOSIS — N3000 Acute cystitis without hematuria: Secondary | ICD-10-CM

## 2016-06-28 MED ORDER — NITROFURANTOIN MONOHYD MACRO 100 MG PO CAPS: 100.0000 mg | ORAL_CAPSULE | Freq: Two times a day (BID) | ORAL | 0 refills | Status: DC

## 2016-06-28 NOTE — Progress Notes (Signed)

## 2016-06-30 ENCOUNTER — Ambulatory Visit (INDEPENDENT_AMBULATORY_CARE_PROVIDER_SITE_OTHER): Payer: 59 | Admitting: Family Medicine

## 2016-06-30 ENCOUNTER — Encounter: Payer: Self-pay | Admitting: Family Medicine

## 2016-06-30 VITALS — BP 112/64 | HR 80 | Temp 98.9°F | Resp 14 | Ht 65.0 in | Wt 129.0 lb

## 2016-06-30 DIAGNOSIS — J029 Acute pharyngitis, unspecified: Secondary | ICD-10-CM | POA: Diagnosis not present

## 2016-06-30 DIAGNOSIS — R002 Palpitations: Secondary | ICD-10-CM

## 2016-06-30 LAB — CBC WITH DIFFERENTIAL/PLATELET
BASOS ABS: 0 {cells}/uL (ref 0–200)
Basophils Relative: 0 %
Eosinophils Absolute: 152 cells/uL (ref 15–500)
Eosinophils Relative: 2 %
HEMATOCRIT: 43.7 % (ref 35.0–45.0)
HEMOGLOBIN: 14.7 g/dL (ref 12.0–15.0)
LYMPHS ABS: 1976 {cells}/uL (ref 850–3900)
Lymphocytes Relative: 26 %
MCH: 27.8 pg (ref 27.0–33.0)
MCHC: 33.6 g/dL (ref 32.0–36.0)
MCV: 82.8 fL (ref 80.0–100.0)
MONO ABS: 456 {cells}/uL (ref 200–950)
MPV: 10.5 fL (ref 7.5–12.5)
Monocytes Relative: 6 %
NEUTROS PCT: 66 %
Neutro Abs: 5016 cells/uL (ref 1500–7800)
Platelets: 205 10*3/uL (ref 140–400)
RBC: 5.28 MIL/uL — AB (ref 3.80–5.10)
RDW: 14 % (ref 11.0–15.0)
WBC: 7.6 10*3/uL (ref 3.8–10.8)

## 2016-06-30 LAB — COMPREHENSIVE METABOLIC PANEL
ALBUMIN: 4.3 g/dL (ref 3.6–5.1)
ALK PHOS: 87 U/L (ref 33–115)
ALT: 24 U/L (ref 6–29)
AST: 25 U/L (ref 10–30)
BILIRUBIN TOTAL: 0.5 mg/dL (ref 0.2–1.2)
BUN: 10 mg/dL (ref 7–25)
CALCIUM: 9.4 mg/dL (ref 8.6–10.2)
CO2: 28 mmol/L (ref 20–31)
Chloride: 103 mmol/L (ref 98–110)
Creat: 0.65 mg/dL (ref 0.50–1.10)
Glucose, Bld: 77 mg/dL (ref 70–99)
Potassium: 4 mmol/L (ref 3.5–5.3)
Sodium: 138 mmol/L (ref 135–146)
TOTAL PROTEIN: 7.1 g/dL (ref 6.1–8.1)

## 2016-06-30 LAB — STREP GROUP A AG, W/REFLEX TO CULT: STREGTOCOCCUS GROUP A AG SCREEN: NOT DETECTED

## 2016-06-30 MED ORDER — AMOXICILLIN 500 MG PO CAPS: 500.0000 mg | ORAL_CAPSULE | Freq: Two times a day (BID) | ORAL | 0 refills | Status: DC

## 2016-06-30 NOTE — Progress Notes (Signed)
   Subjective:    Patient ID: [name], female    DOB: 12-18-1980, 35 y.o.   MRN: [id number]  Patient presents for Illness (x1 day- sore throat, chills body aches)  Patient here with sore throat that started this morning 5 aches. Has not had a fever no chills. 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 No known sick contacts. She is currently on Macrobid given to her for UTI via E visit  She also admits to having a few episodes of palpitations. Typically is when she is resting at night. She does have family history of heart disease. They actually started a couple months ago when her Effexor was increased to 75 mg by her psychiatrist however she does not was stop the medication because it does help with her anxiety and depression. She denies any chest pain with exertion or shortness of breath and nausea vomiting associated. The episodes do not wake her from her sleep Review Of Systems:  GEN- denies fatigue, fever, weight loss,weakness, recent illness HEENT- denies eye drainage, change in vision, nasal discharge, CVS- denies chest pain, palpitations RESP- denies SOB, cough, wheeze ABD- denies N/V, change in stools, abd pain GU- denies dysuria, hematuria, dribbling, incontinence MSK- denies joint pain, muscle aches, injury Neuro- denies headache, dizziness, syncope, seizure activity       Objective:    BP 112/64 (BP Location: Left Arm, Patient Position: Sitting, Cuff Size: Normal)   Pulse 80   Temp 98.9 F (37.2 C) (Oral)   Resp 14   Ht 5\' 5"  (1.651 m)   Wt 129 lb (58.5 kg)   LMP 06/18/2016 (Approximate)   BMI 21.47 kg/m  GEN- NAD, alert and oriented x3 HEENT- PERRL, EOMI, non injected sclera, pink conjunctiva, MMM, oropharynx +injection, no exudates,  TM clear bilat no effusion, nares clear  Neck- Supple, shotty ant  LAD CVS- RRR, no murmur RESP-CTAB EXT- No edema Pulses- Radial 2+         Assessment & Plan:      Problem  List Items Addressed This Visit    Palpitations    EKG machine not operable but no current symptoms Discussed holter she declines Check lytes and TSH Based on history this occurred with increasing her medication. Advised she could try decreasing back to 37.5 mg the child for a couple weeks and see if the episodes improved if they do she may need to be on a lower dose of medication or something similar.      Relevant Orders   CBC with Differential/Platelet   Comprehensive metabolic panel   TSH    Other Visit Diagnoses    Pharyngitis    -  Primary   Rapid strep negative in our office that she had a positive one this morning at her job. We'll start her on Amoxil 500 twice a day ,culture sent , magic mouthwas   Relevant Orders   STREP GROUP A AG, W/REFLEX TO CULT (Completed)      Note: This dictation was prepared with Dragon dictation along with smaller phrase technology. Any transcriptional errors that result from this process are unintentional.

## 2016-06-30 NOTE — Assessment & Plan Note (Signed)
EKG machine not operable but no current symptoms Discussed holter she declines Check lytes and TSH Based on history this occurred with increasing her medication. Advised she could try decreasing back to 37.5 mg the child for a couple weeks and see if the episodes improved if they do she may need to be on a lower dose of medication or something similar.

## 2016-06-30 NOTE — Patient Instructions (Signed)
Take amoxicillin Decrease effexor down to 37.5mg  Continue all other medication Use probiotics F/U as needed

## 2016-07-01 LAB — TSH: TSH: 1.19 m[IU]/L

## 2016-07-02 LAB — CULTURE, GROUP A STREP: ORGANISM ID, BACTERIA: NORMAL

## 2016-07-17 MED FILL — VENLAFAXINE HCL ER 75 MG CA: 75 | 30 days supply | Qty: 30 | Fill #1

## 2016-07-17 MED FILL — AMPHETAMINE SALTS 15 MG TAB: 15 | 30 days supply | Qty: 30 | Fill #0

## 2016-07-21 DIAGNOSIS — L812 Freckles: Secondary | ICD-10-CM | POA: Diagnosis not present

## 2016-07-22 ENCOUNTER — Encounter: Payer: Self-pay | Admitting: Family Medicine

## 2016-07-22 ENCOUNTER — Ambulatory Visit (INDEPENDENT_AMBULATORY_CARE_PROVIDER_SITE_OTHER): Payer: 59 | Admitting: Family Medicine

## 2016-07-22 VITALS — BP 104/68 | HR 62 | Temp 98.4°F | Resp 14 | Ht 65.0 in | Wt 132.0 lb

## 2016-07-22 DIAGNOSIS — L255 Unspecified contact dermatitis due to plants, except food: Secondary | ICD-10-CM

## 2016-07-22 MED ORDER — METHYLPREDNISOLONE ACETATE 80 MG/ML IJ SUSP
80.0000 mg | Freq: Once | INTRAMUSCULAR | Status: AC
  Administered 2016-07-22: 80 mg via INTRAMUSCULAR

## 2016-07-22 MED ORDER — PREDNISONE 10 MG PO TABS: ORAL_TABLET | ORAL | 0 refills | Status: DC

## 2016-07-22 NOTE — Patient Instructions (Signed)
Start prednisone tomorrow  Benadryl at bedtime  F/U as needed

## 2016-07-22 NOTE — Progress Notes (Signed)
   Subjective:    Patient ID: [name], female    DOB: 03/29/1981, 35 y.o.   MRN: [id number]  Patient presents for Rash (pulled weeds out of flower bed and now has edema and redness to L side of face/ eye- thinks it may be poison oak- has been using calamine and benadryl cream)  Pt here with swelling, itching redness, rash of face and left arm. She was helping pull weeds, noted poison IVY, this was Sunday evening. Monday evening noted some redness on face and left arm, has progressed to left sided facial swelling around eye and upper lid, vision in tact , no drainage  Left arm with blisters and erythema    Review Of Systems:  GEN- denies fatigue, fever, weight loss,weakness, recent illness HEENT- denies eye drainage, change in vision, nasal discharge, CVS- denies chest pain, palpitations RESP- denies SOB, cough, wheeze ABD- denies N/V, change in stools, abd pain GU- denies dysuria, hematuria, dribbling, incontinence MSK- denies joint pain, muscle aches, injury Neuro- denies headache, dizziness, syncope, seizure activity       Objective:    BP 104/68 (BP Location: Left Arm, Patient Position: Sitting, Cuff Size: Normal)   Pulse 62   Temp 98.4 F (36.9 C) (Oral)   Resp 14   Ht 5\' 5"  (1.651 m)   Wt 132 lb (59.9 kg)   LMP 06/18/2016 (Approximate)   BMI 21.97 kg/m  GEN- NAD, alert and oriented x3 HEENT- PERRL, EOMI, non injected sclera, pink conjunctiva, MMM, oropharynx clear,swelling left upper lid, erythematous rash with small with tiny few blistering like lesions  Neck- Supple, no LAD  CVS- RRR, no murmur RESP-CTAB Skin- left arm erythema with linear blisters above wrist  EXT- No edema Pulses- Radial  2+        Assessment & Plan:      Problem List Items Addressed This Visit    None    Visit Diagnoses    Dermatitis due to plants, including poison ivy, sumac, and oak    -  Primary   Depo Medrol 80mg  given, vision grossly in tact, no oropharyngeal swelling.  Prednisone taper, bendadryl, note for work today   Relevant Medications   methylPREDNISolone acetate (DEPO-MEDROL) injection 80 mg (Completed)      Note: This dictation was prepared with Dragon dictation along with smaller phrase technology. Any transcriptional errors that result from this process are unintentional.

## 2016-07-23 ENCOUNTER — Encounter: Payer: Self-pay | Admitting: Family Medicine

## 2016-08-01 ENCOUNTER — Encounter: Payer: Self-pay | Admitting: Family Medicine

## 2016-08-18 MED FILL — AMPHETAMINE SALTS 15 MG TAB: 15 | 30 days supply | Qty: 30 | Fill #0

## 2016-08-21 MED FILL — VENLAFAXINE HCL ER 75 MG CA: 75 | 30 days supply | Qty: 30 | Fill #2

## 2016-09-01 ENCOUNTER — Telehealth: Payer: Self-pay | Admitting: *Deleted

## 2016-09-01 MED ORDER — HYDROCODONE-ACETAMINOPHEN 5-325 MG PO TABS: 1.0000 | ORAL_TABLET | Freq: Four times a day (QID) | ORAL | 0 refills | Status: DC | PRN

## 2016-09-01 NOTE — Telephone Encounter (Signed)
Prescription printed and patient made aware to come to office to pick up.  

## 2016-09-01 NOTE — Telephone Encounter (Signed)
Alycia Rossetti, MD  Christina H Six, LPN        Okay to refill, put in phone note please.   Previous Messages    ----- Message -----  From: Eual Fines, LPN  Sent: [id number] 11:04 AM  To: Alycia Rossetti, MD   Can I get a refill on the hydrocodone? I hope Ayanna had a great birthday

## 2016-09-05 DIAGNOSIS — F319 Bipolar disorder, unspecified: Secondary | ICD-10-CM | POA: Diagnosis not present

## 2016-09-05 DIAGNOSIS — F411 Generalized anxiety disorder: Secondary | ICD-10-CM | POA: Diagnosis not present

## 2016-09-05 DIAGNOSIS — F902 Attention-deficit hyperactivity disorder, combined type: Secondary | ICD-10-CM | POA: Diagnosis not present

## 2016-09-09 MED FILL — AMPHETAMINE SALTS 15 MG TAB: 15 | 30 days supply | Qty: 60 | Fill #0

## 2016-09-23 ENCOUNTER — Emergency Department
Admission: EM | Admit: 2016-09-23 | Discharge: 2016-09-23 | Disposition: A | Discharge status: Home / Self Care | Payer: 59 | Source: Home / Self Care | Attending: Family Medicine | Admitting: Family Medicine

## 2016-09-23 ENCOUNTER — Encounter: Payer: Self-pay | Admitting: *Deleted

## 2016-09-23 DIAGNOSIS — N3001 Acute cystitis with hematuria: Secondary | ICD-10-CM

## 2016-09-23 DIAGNOSIS — R300 Dysuria: Secondary | ICD-10-CM

## 2016-09-23 DIAGNOSIS — J029 Acute pharyngitis, unspecified: Secondary | ICD-10-CM | POA: Diagnosis not present

## 2016-09-23 HISTORY — DX: Trichotillomania: F63.3

## 2016-09-23 LAB — POCT URINALYSIS DIP (MANUAL ENTRY)
Bilirubin, UA: NEGATIVE
Glucose, UA: NEGATIVE
Nitrite, UA: NEGATIVE
Protein Ur, POC: 100 — AB
Spec Grav, UA: 1.015 (ref 1.005–1.03)
Urobilinogen, UA: 1 (ref 0–1)
pH, UA: 7 (ref 5–8)

## 2016-09-23 LAB — POCT RAPID STREP A (OFFICE): Rapid Strep A Screen: NEGATIVE

## 2016-09-23 MED ORDER — CEPHALEXIN 500 MG PO CAPS: 500.0000 mg | ORAL_CAPSULE | Freq: Two times a day (BID) | ORAL | 0 refills | Status: DC

## 2016-09-23 NOTE — ED Triage Notes (Signed)
Patient c/o 6 days of dysuria, right flank pain and hematuria. Yesterday developed sore throat and body aches. Taken alka seltzer cold otc.

## 2016-09-23 NOTE — ED Provider Notes (Signed)
CSN: [id number]     Arrival date & time 09/23/16  1402 History   First MD Initiated Contact with Patient 09/23/16 1425     Chief Complaint  Patient presents with  . Dysuria  . Hematuria  . Sore Throat   (Consider location/radiation/quality/duration/timing/severity/associated sxs/prior Treatment) HPI  [name] is a 35 y.o. female presenting to UC with c/o 6 days of gradually worsening dysuria with urinary frequency and visible hematuria. She is also developing Right sided lower back pain.  Yesterday she developed a sore throat and body aches.  Throat pain is 5/10 at this time but worse last night.  She reports hx of recurrent UTIs in the past and was prescribed antibiotics by a urologist as needed but she notes she does not have anymore as her last UTI was several months ago.  Denies fever, chills, n/v/d.    Past Medical History:  Diagnosis Date  . 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 Bipolar 1 disorder, mixed ([place])   . Bipolar disorder, mixed ([place])   . Depression   . Hair pulling   . HSIL on Pap smear of cervix 2013   [place]   Past Surgical History:  Procedure Laterality Date  . CHOLECYSTECTOMY    . TUBAL LIGATION     Family History  Problem Relation Age of Onset  . Cancer Mother     lung  . Heart disease Mother   . Alcohol abuse Mother   . Bipolar disorder Mother   . Cancer Father     prostate    Social History  Substance Use Topics  . Smoking status: Current Every Day Smoker    Packs/day: 1.00    Types: Cigarettes  . Smokeless tobacco: Never Used  . Alcohol use 0.0 oz/week     Comment: socially only less than monthly   OB History    No data available     Review of Systems  Constitutional: Negative for chills and fever.  HENT: Positive for sore throat. Negative for congestion, ear pain, trouble swallowing and voice change.   Respiratory: Negative for cough and shortness of breath.   Cardiovascular: Negative for  chest pain and palpitations.  Gastrointestinal: Negative for abdominal pain, diarrhea, nausea and vomiting.  Genitourinary: Positive for dysuria, flank pain (Right), frequency, hematuria and urgency. Negative for decreased urine volume, pelvic pain, vaginal bleeding, vaginal discharge and vaginal pain.  Musculoskeletal: Positive for arthralgias, back pain (Right lower) and myalgias.       Body aches  Skin: Negative for rash.    Allergies  Lamictal [lamotrigine] and Prozac [fluoxetine hcl]  Home Medications   Prior to Admission medications   Medication Sig Start Date End Date Taking? Authorizing Provider  amphetamine-dextroamphetamine (ADDERALL) 15 MG tablet Take 15 mg by mouth daily.   Yes Historical Provider, MD  ARIPiprazole (ABILIFY) 2 MG tablet Take 2 mg by mouth daily.   Yes Historical Provider, MD  venlafaxine XR (EFFEXOR-XR) 75 MG 24 hr capsule Take 75 mg by mouth daily with breakfast.   Yes Historical Provider, MD  cephALEXin (KEFLEX) 500 MG capsule Take 1 capsule (500 mg total) by mouth 2 (two) times daily. For 7 days 09/23/16   Noland Fordyce, PA-C  clonazePAM Bobbye [place]) 0.5 MG tablet Take one twice a day (PRN) 10/26/15   Cloria Spring, MD  HYDROcodone-acetaminophen (NORCO) 5-325 MG tablet Take 1 tablet by mouth every 6 (six) hours as needed for  moderate pain. 09/01/16   Alycia Rossetti, MD   Meds Ordered and Administered this Visit  Medications - No data to display  BP 103/73 (BP Location: Left Arm)   Pulse 106   Temp 98.7 F (37.1 C) (Oral)   Resp 16   Wt 139 lb (63 kg)   LMP 09/14/2016   SpO2 100%   BMI 23.13 kg/m  No data found.   Physical Exam  Constitutional: She appears well-developed and well-nourished. No distress.  HENT:  Head: Normocephalic and atraumatic.  Right Ear: Tympanic membrane normal.  Left Ear: Tympanic membrane normal.  Nose: Nose normal.  Mouth/Throat: Uvula is midline and mucous membranes are normal. Posterior oropharyngeal erythema  present. No oropharyngeal exudate, posterior oropharyngeal edema or tonsillar abscesses.  Eyes: Conjunctivae are normal. No scleral icterus.  Neck: Normal range of motion. Neck supple.  Cardiovascular: Normal rate, regular rhythm and normal heart sounds.   Pulmonary/Chest: Effort normal and breath sounds normal. No respiratory distress. She has no wheezes. She has no rales.  Abdominal: Soft. She exhibits no distension. There is no tenderness. There is no guarding and no CVA tenderness.  Musculoskeletal: Normal range of motion.  Neurological: She is alert.  Skin: Skin is warm and dry. She is not diaphoretic.  Nursing note and vitals reviewed.   Urgent Care Course   Clinical Course     Procedures (including critical care time)  Labs Review Labs Reviewed  POCT URINALYSIS DIP (MANUAL ENTRY) - Abnormal; Notable for the following:       Result Value   Clarity, UA cloudy (*)    Ketones, POC UA trace (5) (*)    Blood, UA moderate (*)    Protein Ur, POC =100 (*)    Leukocytes, UA large (3+) (*)    All other components within normal limits  URINE CULTURE  POCT RAPID STREP A (OFFICE)    Imaging Review No results found.   MDM   1. Acute cystitis with hematuria   2. Pharyngitis, unspecified etiology   3. Dysuria    Pt c/o 6 days of worsening urinary symptoms as well as sore throat that started last night with body aches.  UA c/w UTI, will send culture. Based on medical records, Keflex should work for current UTI.  Rapid strep: negative Culture sent.  Will start pt on Keflex for 7 days for UTI, if strep culture is positive, will call in additional Keflex for 3 more days.  F/u with PCP as needed.    Noland Fordyce, PA-C 09/23/16 1531

## 2016-09-25 ENCOUNTER — Telehealth: Payer: Self-pay | Admitting: Emergency Medicine

## 2016-09-25 LAB — URINE CULTURE: Organism ID, Bacteria: NO GROWTH

## 2016-09-26 ENCOUNTER — Telehealth: Payer: Self-pay | Admitting: Emergency Medicine

## 2016-09-30 MED FILL — ARIPiprazole 2 MG TABS: 2 | 30 days supply | Qty: 30 | Fill #0

## 2016-10-04 ENCOUNTER — Encounter: Payer: Self-pay | Admitting: Family Medicine

## 2016-10-06 ENCOUNTER — Ambulatory Visit (INDEPENDENT_AMBULATORY_CARE_PROVIDER_SITE_OTHER): Payer: 59 | Admitting: Family Medicine

## 2016-10-06 ENCOUNTER — Encounter: Payer: Self-pay | Admitting: Family Medicine

## 2016-10-06 VITALS — BP 102/60 | HR 90 | Temp 99.3°F | Resp 14 | Ht 65.0 in | Wt 134.0 lb

## 2016-10-06 DIAGNOSIS — R233 Spontaneous ecchymoses: Secondary | ICD-10-CM | POA: Diagnosis not present

## 2016-10-06 DIAGNOSIS — R61 Generalized hyperhidrosis: Secondary | ICD-10-CM

## 2016-10-06 LAB — CBC WITH DIFFERENTIAL/PLATELET
BASOS ABS: 0 {cells}/uL (ref 0–200)
BASOS PCT: 0 %
EOS ABS: 213 {cells}/uL (ref 15–500)
EOS PCT: 3 %
HCT: 40.9 % (ref 35.0–45.0)
Hemoglobin: 13.8 g/dL (ref 12.0–15.0)
LYMPHS PCT: 26 %
Lymphs Abs: 1846 cells/uL (ref 850–3900)
MCH: 28 pg (ref 27.0–33.0)
MCHC: 33.7 g/dL (ref 32.0–36.0)
MCV: 83.1 fL (ref 80.0–100.0)
MONOS PCT: 6 %
MPV: 10.2 fL (ref 7.5–12.5)
Monocytes Absolute: 426 cells/uL (ref 200–950)
NEUTROS ABS: 4615 {cells}/uL (ref 1500–7800)
Neutrophils Relative %: 65 %
PLATELETS: 215 10*3/uL (ref 140–400)
RBC: 4.92 MIL/uL (ref 3.80–5.10)
RDW: 14 % (ref 11.0–15.0)
WBC: 7.1 10*3/uL (ref 3.8–10.8)

## 2016-10-06 LAB — COMPREHENSIVE METABOLIC PANEL
ALK PHOS: 86 U/L (ref 33–115)
ALT: 43 U/L — ABNORMAL HIGH (ref 6–29)
AST: 29 U/L (ref 10–30)
Albumin: 3.8 g/dL (ref 3.6–5.1)
BILIRUBIN TOTAL: 0.3 mg/dL (ref 0.2–1.2)
BUN: 16 mg/dL (ref 7–25)
CHLORIDE: 102 mmol/L (ref 98–110)
CO2: 27 mmol/L (ref 20–31)
Calcium: 8.8 mg/dL (ref 8.6–10.2)
Creat: 0.88 mg/dL (ref 0.50–1.10)
GLUCOSE: 80 mg/dL (ref 70–99)
POTASSIUM: 4.1 mmol/L (ref 3.5–5.3)
SODIUM: 136 mmol/L (ref 135–146)
Total Protein: 6.9 g/dL (ref 6.1–8.1)

## 2016-10-06 MED ORDER — PREGABALIN 75 MG PO CAPS: 75.0000 mg | ORAL_CAPSULE | Freq: Two times a day (BID) | ORAL | 2 refills | Status: DC

## 2016-10-06 NOTE — Progress Notes (Signed)
Subjective:    Patient ID: [name], female    DOB: [birth year], 35 y.o.   MRN: [id number]  Patient presents for Night Sweats (x1 month- wake up in the middle of the night soaked); Lymph Nodes Swelling (B sides of neck); and Bruising (has frequent bruising to arms and legs )  Patient here with a culmination of symptoms over the past month. She began having episodes where she will   feel itching on her legs she would then break out in red splotchy places to treat then resulted in bruising hours later. She never saw any particular rash. She was then treated for urinary tract infection and also diagnosed with viral pharyngitis and her symptoms have worsened since then. She feels swollen small lymph nodes she also has had night sweats on and off no significant if fever above 62F. She's not had any GI symptoms of nausea vomiting headache no diarrhea.  She has not had any change in anything else in her regular routine. None of her family members are sick with any illness. 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 This was in 2009.  He does feel some fullness in her abdomen has had some decreased appetite.      Review Of Systems:  GEN- denies fatigue,+ fever, weight loss,weakness, recent illness HEENT- denies eye drainage, change in vision, nasal discharge, CVS- denies chest pain, palpitations RESP- denies SOB, cough, wheeze ABD- denies N/V, change in stools, abd pain GU- denies dysuria, hematuria, dribbling, incontinence MSK- denies joint pain, muscle aches, injury Neuro- denies headache, dizziness, syncope, seizure activity       Objective:    BP 102/60 (BP Location: Left Arm, Patient Position: Sitting, Cuff Size: Normal)   Pulse 90   Temp 99.3 F (37.4 C) (Oral)   Resp 14   Ht 5\' 5"  (1.651 m)   Wt 134 lb (60.8 kg)   LMP 09/14/2016    SpO2 99%   BMI 22.30 kg/m  GEN- NAD, alert and oriented x3,well appearing  HEENT- PERRL, EOMI, non injected sclera, pink conjunctiva, MMM, oropharynx clear, TM clear no effusion Neck- Supple, no thyromegaly, shotty cervical nodes  CVS- RRR, no murmur , HR  90  RESP-CTAB ABD-NABS,soft,NT,ND, no HSM  Skin- bruising, few petechial spots on legs  EXT- No edema Pulses- Radial, DP- 2+   Viewed pictures- macular erythematous patches on legs, palms/inner wrist, then same areas hours later with bruising and small petehcia        Assessment & Plan:      Problem List Items Addressed This Visit    None    Visit Diagnoses    Night sweats    -  Primary   unclear cause of her symptoms, based on history possible viral illness, other possibility autoimmune disorder. No new medications to cause symptoms. No B symptoms weight loss , cough to suggest TB. Doubt related to Keflex, unabless this was a viral reaction with the bruising petechia after the treatment. Screen for viral illness, check CMET /PTINR for liver abnormalites May need hematology input. Note she did ask about restarting her lyrica, will hold off for now   Relevant Orders   CBC with Differential/Platelet   Comprehensive metabolic panel   Sedimentation Rate   ANA   Spontaneous ecchymoses       Relevant Orders  Protime-INR   Viral screen(EBV capsid ab+RSV+CMV ab)   Sedimentation Rate   ANA      Note: This dictation was prepared with Dragon dictation along with smaller phrase technology. Any transcriptional errors that result from this process are unintentional.

## 2016-10-06 NOTE — Patient Instructions (Signed)
F/u pending results 

## 2016-10-07 LAB — ANA: Anti Nuclear Antibody(ANA): NEGATIVE

## 2016-10-07 LAB — PROTIME-INR
INR: 1
PROTHROMBIN TIME: 11 s (ref 9.0–11.5)

## 2016-10-07 LAB — SEDIMENTATION RATE: Sed Rate: 1 mm/hr (ref 0–20)

## 2016-10-07 MED FILL — AMPHETAMINE SALTS 15 MG TAB: 15 | 30 days supply | Qty: 60 | Fill #0

## 2016-10-10 LAB — VIRAL SCREEN(EBV CAPSID AB+RSV+CMV AB)
EBV VCA IGG: 722 U/mL — AB
RSV Antibodies: <1:8 {titer}

## 2016-10-14 ENCOUNTER — Encounter: Payer: Self-pay | Admitting: Family Medicine

## 2016-10-14 ENCOUNTER — Telehealth: Payer: Self-pay | Admitting: Family Medicine

## 2016-10-14 DIAGNOSIS — R2689 Other abnormalities of gait and mobility: Secondary | ICD-10-CM

## 2016-10-14 DIAGNOSIS — R296 Repeated falls: Secondary | ICD-10-CM

## 2016-10-14 NOTE — Telephone Encounter (Signed)
For about the past week or so I have been having very bad memory issues. For example I will start a sentence and then stop, forgetting what I was about to say. Or I will go in a room for something and forget what it was. Here this am, I pulled up google to look up something and after I had it pulled up I forgot what I had wanted to look up. It does seen odd and sudden. Could this be part of the virus or should I keep an eye on it? Also I fell Friday on my deck. My foot slipped and I smacked the deck on my back. And I have been tripping a lot- just off balance. (like my 3rd fall in 3 months) My mother in law said she was going to have to give me her wheelchair if I kept on. Just wanted to report that to you since I had so much going on at the last visit I forgot to mention it.  Pt message sent above in staff messages, I replied below as a pt email - I saw your message from above, the office was closed today.  I think the viral illness is within the past month, falls over the past few months I would attribute to something else. At your age it is uncommon to have balance issues with falls, and memory changes. As you have presented with very interesting combination symptoms and besides the EBV labs have been normal, I recommend getting an MRI of your brain to rule out white matter diseases such ( MS). We will get this set up.

## 2016-10-17 ENCOUNTER — Encounter: Payer: Self-pay | Admitting: Family Medicine

## 2016-10-17 MED ORDER — PREGABALIN 75 MG PO CAPS: 75.0000 mg | ORAL_CAPSULE | Freq: Two times a day (BID) | ORAL | 2 refills | Status: DC

## 2016-10-24 ENCOUNTER — Ambulatory Visit (HOSPITAL_COMMUNITY)
Admission: RE | Admit: 2016-10-24 | Discharge: 2016-10-24 | Disposition: A | Discharge status: Home / Self Care | Payer: 59 | Source: Ambulatory Visit | Attending: Family Medicine | Admitting: Family Medicine

## 2016-10-24 DIAGNOSIS — R296 Repeated falls: Secondary | ICD-10-CM | POA: Insufficient documentation

## 2016-10-24 DIAGNOSIS — S0990XA Unspecified injury of head, initial encounter: Secondary | ICD-10-CM | POA: Diagnosis not present

## 2016-10-24 DIAGNOSIS — R2689 Other abnormalities of gait and mobility: Secondary | ICD-10-CM | POA: Insufficient documentation

## 2016-10-28 ENCOUNTER — Ambulatory Visit (HOSPITAL_COMMUNITY): Payer: 59

## 2016-10-30 MED FILL — VENLAFAXINE HCL ER 75 MG CA: 75 | 30 days supply | Qty: 30 | Fill #0

## 2016-10-30 MED FILL — ARIPiprazole 2 MG TABS: 2 | 30 days supply | Qty: 30 | Fill #1

## 2016-11-05 MED FILL — AMPHETAMINE SALTS 15 MG TAB: 15 | 30 days supply | Qty: 60 | Fill #0

## 2016-11-14 ENCOUNTER — Ambulatory Visit (INDEPENDENT_AMBULATORY_CARE_PROVIDER_SITE_OTHER): Payer: 59 | Admitting: Family Medicine

## 2016-11-14 ENCOUNTER — Encounter: Payer: Self-pay | Admitting: Family Medicine

## 2016-11-14 VITALS — BP 104/68 | HR 100 | Temp 99.1°F | Resp 14 | Ht 65.0 in | Wt 148.0 lb

## 2016-11-14 DIAGNOSIS — B2790 Infectious mononucleosis, unspecified without complication: Secondary | ICD-10-CM | POA: Diagnosis not present

## 2016-11-14 DIAGNOSIS — M5417 Radiculopathy, lumbosacral region: Secondary | ICD-10-CM

## 2016-11-14 DIAGNOSIS — M5416 Radiculopathy, lumbar region: Secondary | ICD-10-CM

## 2016-11-14 MED ORDER — HYDROCODONE-ACETAMINOPHEN 5-325 MG PO TABS: 1.0000 | ORAL_TABLET | Freq: Four times a day (QID) | ORAL | 0 refills | Status: DC | PRN

## 2016-11-14 MED ORDER — GABAPENTIN 100 MG PO CAPS: ORAL_CAPSULE | ORAL | 2 refills | Status: DC

## 2016-11-14 NOTE — Progress Notes (Signed)
   Subjective:    Patient ID: [name], female    DOB: 1981-07-07, 36 y.o.   MRN: [id number]  Patient presents for Follow-up   follow-up her EBV. Her symptoms have vastly improved. She no longer has to spontaneous bruising or petechiae. Improved her memory is also better. The lymph nodes also went down. She's here for recheck on her labs as well as mild elevation in her liver function tests.  He still gets radicular symptoms for her back she restart the Lyrica but this is making her too somnolent therefore she wants to discontinue and try gabapentin also requests a refill on her pain medication    Review Of Systems:  GEN- denies fatigue, fever, weight loss,weakness, recent illness HEENT- denies eye drainage, change in vision, nasal discharge, CVS- denies chest pain, palpitations RESP- denies SOB, cough, wheeze ABD- denies N/V, change in stools, abd pain GU- denies dysuria, hematuria, dribbling, incontinence MSK- + joint pain, muscle aches, injury Neuro- denies headache, dizziness, syncope, seizure activity       Objective:    BP 104/68 (BP Location: Left Arm, Patient Position: Sitting, Cuff Size: Normal)   Pulse 100   Temp 99.1 F (37.3 C) (Oral)   Resp 14   Ht 5\' 5"  (1.651 m)   Wt 148 lb (67.1 kg)   SpO2 99%   BMI 24.63 kg/m  GEN- NAD, alert and oriented x3 HEENT- PERRL, EOMI, non injected sclera, pink conjunctiva, MMM, oropharynx clear Neck- Supple, no LAD CVS- RRR, no murmur RESP-CTAB ABD-NABS,soft,NT,ND NO HSM EXT- No edema Pulses- Radial  2+        Assessment & Plan:      Problem List Items Addressed This Visit    Lumbar back pain with radiculopathy affecting right lower extremity    D/C lyrica, gabapentin to be titrated 100-300mg          Relevant Medications   gabapentin (NEURONTIN) 100 MG capsule   HYDROcodone-acetaminophen (NORCO) 5-325 MG tablet    Other Visit Diagnoses    EBV infection    -  Primary   EBV infection symptoms improved,  recheck titers   Relevant Orders   Epstein-Barr virus VCA, IgM   Epstein-Barr virus VCA, IgG   CBC with Differential/Platelet   Comprehensive metabolic panel      Note: This dictation was prepared with Dragon dictation along with smaller phrase technology. Any transcriptional errors that result from this process are unintentional.

## 2016-11-14 NOTE — Assessment & Plan Note (Signed)
D/C lyrica, gabapentin to be titrated 100-300mg 

## 2016-11-14 NOTE — Patient Instructions (Addendum)
We will call with lab results Try the gabapentin, start with 100mg  at bedtime titrate up after 1 week, to max 300mg  F/U as needed

## 2016-11-15 LAB — COMPREHENSIVE METABOLIC PANEL
ALBUMIN: 4 g/dL (ref 3.6–5.1)
ALK PHOS: 78 U/L (ref 33–115)
ALT: 39 U/L — AB (ref 6–29)
AST: 28 U/L (ref 10–30)
BUN: 11 mg/dL (ref 7–25)
CO2: 28 mmol/L (ref 20–31)
CREATININE: 0.71 mg/dL (ref 0.50–1.10)
Calcium: 8.9 mg/dL (ref 8.6–10.2)
Chloride: 103 mmol/L (ref 98–110)
Glucose, Bld: 70 mg/dL (ref 70–99)
POTASSIUM: 4 mmol/L (ref 3.5–5.3)
Sodium: 137 mmol/L (ref 135–146)
TOTAL PROTEIN: 6.7 g/dL (ref 6.1–8.1)
Total Bilirubin: 0.3 mg/dL (ref 0.2–1.2)

## 2016-11-15 LAB — CBC WITH DIFFERENTIAL/PLATELET
BASOS ABS: 0 {cells}/uL (ref 0–200)
Basophils Relative: 0 %
EOS ABS: 165 {cells}/uL (ref 15–500)
Eosinophils Relative: 3 %
HCT: 40 % (ref 35.0–45.0)
HEMOGLOBIN: 13.4 g/dL (ref 12.0–15.0)
LYMPHS ABS: 1705 {cells}/uL (ref 850–3900)
Lymphocytes Relative: 31 %
MCH: 27.9 pg (ref 27.0–33.0)
MCHC: 33.5 g/dL (ref 32.0–36.0)
MCV: 83.2 fL (ref 80.0–100.0)
MONO ABS: 385 {cells}/uL (ref 200–950)
MONOS PCT: 7 %
MPV: 10.9 fL (ref 7.5–12.5)
NEUTROS ABS: 3245 {cells}/uL (ref 1500–7800)
Neutrophils Relative %: 59 %
PLATELETS: 190 10*3/uL (ref 140–400)
RBC: 4.81 MIL/uL (ref 3.80–5.10)
RDW: 14 % (ref 11.0–15.0)
WBC: 5.5 10*3/uL (ref 3.8–10.8)

## 2016-11-17 LAB — EPSTEIN-BARR VIRUS VCA, IGG: EBV VCA IgG: 674 U/mL — ABNORMAL HIGH

## 2016-11-17 LAB — EPSTEIN-BARR VIRUS VCA, IGM

## 2016-11-28 DIAGNOSIS — F902 Attention-deficit hyperactivity disorder, combined type: Secondary | ICD-10-CM | POA: Diagnosis not present

## 2016-11-28 DIAGNOSIS — F411 Generalized anxiety disorder: Secondary | ICD-10-CM | POA: Diagnosis not present

## 2016-11-28 DIAGNOSIS — F319 Bipolar disorder, unspecified: Secondary | ICD-10-CM | POA: Diagnosis not present

## 2016-11-28 MED FILL — VENLAFAXINE HCL ER 75 MG CA: 75 | 30 days supply | Qty: 30 | Fill #1

## 2016-12-03 MED FILL — ARIPiprazole 2 MG TABS: 2 | 30 days supply | Qty: 30 | Fill #0

## 2016-12-03 MED FILL — AMPHETAMINE SALTS 15 MG TAB: 15 | 30 days supply | Qty: 60 | Fill #0

## 2016-12-31 MED FILL — VENLAFAXINE HCL ER 75 MG CA: 75 | 30 days supply | Qty: 30 | Fill #0

## 2016-12-31 MED FILL — AMPHETAMINE SALTS 15 MG TAB: 15 | 30 days supply | Qty: 60 | Fill #0

## 2017-01-13 ENCOUNTER — Ambulatory Visit: Payer: Self-pay | Admitting: Urology

## 2017-01-15 ENCOUNTER — Other Ambulatory Visit: Payer: Self-pay | Admitting: Family Medicine

## 2017-01-15 MED ORDER — HYDROCODONE-ACETAMINOPHEN 5-325 MG PO TABS: 1.0000 | ORAL_TABLET | Freq: Four times a day (QID) | ORAL | 0 refills | Status: DC | PRN

## 2017-01-19 ENCOUNTER — Telehealth: Payer: Self-pay | Admitting: Family Medicine

## 2017-01-19 MED ORDER — TOPIRAMATE 25 MG PO TABS: 25.0000 mg | ORAL_TABLET | Freq: Every day | ORAL | 1 refills | Status: DC

## 2017-01-19 NOTE — Telephone Encounter (Signed)
Spoke with patient.   States that she is having recurrence of migraines. Reports increased stress in work situation and she thinks that this is contributing to her increase in migraines.   Prescription sent to pharmacy.

## 2017-01-19 NOTE — Telephone Encounter (Signed)
noted 

## 2017-01-19 NOTE — Telephone Encounter (Signed)
I do not see Topamax on medication list.   MD please advise.

## 2017-01-19 NOTE — Telephone Encounter (Signed)
She has not been on this for > 1 year, see what the issue is. If she has recurrence of migraines and needs preventation can start back art 25mg  at bedtime

## 2017-01-19 NOTE — Telephone Encounter (Signed)
Patient asking for refill on her topamax if possible  [phone]

## 2017-01-28 MED FILL — AMPHETAMINE SALTS 15 MG TAB: 15 | 30 days supply | Qty: 60 | Fill #0

## 2017-01-30 MED FILL — VENLAFAXINE HCL ER 75 MG CA: 75 | 30 days supply | Qty: 30 | Fill #1

## 2017-02-20 DIAGNOSIS — F319 Bipolar disorder, unspecified: Secondary | ICD-10-CM | POA: Diagnosis not present

## 2017-02-20 DIAGNOSIS — F902 Attention-deficit hyperactivity disorder, combined type: Secondary | ICD-10-CM | POA: Diagnosis not present

## 2017-02-20 DIAGNOSIS — F411 Generalized anxiety disorder: Secondary | ICD-10-CM | POA: Diagnosis not present

## 2017-02-26 MED FILL — AMPHETAMINE SALTS 15 MG TAB: 15 | 30 days supply | Qty: 60 | Fill #0

## 2017-02-26 MED FILL — ARIPiprazole 2 MG TABS: 2 | 30 days supply | Qty: 30 | Fill #0

## 2017-02-26 MED FILL — VENLAFAXINE HCL ER 75 MG CA: 75 | 30 days supply | Qty: 30 | Fill #0

## 2017-03-07 DIAGNOSIS — H5203 Hypermetropia, bilateral: Secondary | ICD-10-CM | POA: Diagnosis not present

## 2017-03-26 MED FILL — AMPHETAMINE SALTS 15 MG TAB: 15 | 30 days supply | Qty: 60 | Fill #0

## 2017-03-27 MED FILL — ARIPiprazole 2 MG TABS: 2 | 30 days supply | Qty: 30 | Fill #1

## 2017-03-27 MED FILL — VENLAFAXINE HCL ER 75 MG CA: 75 | 30 days supply | Qty: 30 | Fill #1

## 2017-04-06 ENCOUNTER — Other Ambulatory Visit: Payer: Self-pay | Admitting: Family Medicine

## 2017-04-06 MED ORDER — HYDROCODONE-ACETAMINOPHEN 5-325 MG PO TABS: 1.0000 | ORAL_TABLET | Freq: Four times a day (QID) | ORAL | 0 refills | Status: DC | PRN

## 2017-04-16 ENCOUNTER — Encounter: Payer: Self-pay | Admitting: Family Medicine

## 2017-04-17 MED ORDER — TOPIRAMATE 50 MG PO TABS: 50.0000 mg | ORAL_TABLET | Freq: Two times a day (BID) | ORAL | 3 refills | Status: DC

## 2017-04-23 MED FILL — ARIPiprazole 2 MG TABS: 2 | 30 days supply | Qty: 30 | Fill #2

## 2017-04-23 MED FILL — VENLAFAXINE HCL ER 75 MG CA: 75 | 30 days supply | Qty: 30 | Fill #2

## 2017-04-23 MED FILL — AMPHETAMINE SALTS 15 MG TAB: 15 | 30 days supply | Qty: 60 | Fill #0

## 2017-05-01 DIAGNOSIS — F319 Bipolar disorder, unspecified: Secondary | ICD-10-CM | POA: Diagnosis not present

## 2017-05-01 DIAGNOSIS — F902 Attention-deficit hyperactivity disorder, combined type: Secondary | ICD-10-CM | POA: Diagnosis not present

## 2017-05-01 DIAGNOSIS — F411 Generalized anxiety disorder: Secondary | ICD-10-CM | POA: Diagnosis not present

## 2017-05-08 ENCOUNTER — Encounter: Payer: Self-pay | Admitting: Family Medicine

## 2017-05-13 ENCOUNTER — Telehealth: Payer: Self-pay | Admitting: Family Medicine

## 2017-05-13 MED ORDER — METHYLPREDNISOLONE 4 MG PO TBPK: ORAL_TABLET | ORAL | 0 refills | Status: DC

## 2017-05-13 NOTE — Telephone Encounter (Signed)
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

## 2017-05-13 NOTE — Telephone Encounter (Signed)
Pt aware via vm and dose pack sent to pharm [facility])

## 2017-05-13 NOTE — Telephone Encounter (Signed)
Okay to send medrol dosepak I assume lymph node went down

## 2017-05-14 DIAGNOSIS — M5137 Other intervertebral disc degeneration, lumbosacral region: Secondary | ICD-10-CM | POA: Diagnosis not present

## 2017-05-15 ENCOUNTER — Other Ambulatory Visit (HOSPITAL_COMMUNITY): Payer: Self-pay | Admitting: Neurosurgery

## 2017-05-15 ENCOUNTER — Ambulatory Visit (HOSPITAL_COMMUNITY)
Admission: RE | Admit: 2017-05-15 | Discharge: 2017-05-15 | Disposition: A | Discharge status: Home / Self Care | Payer: 59 | Source: Ambulatory Visit | Attending: Neurosurgery | Admitting: Neurosurgery

## 2017-05-15 DIAGNOSIS — M5442 Lumbago with sciatica, left side: Secondary | ICD-10-CM

## 2017-05-15 DIAGNOSIS — M5441 Lumbago with sciatica, right side: Secondary | ICD-10-CM

## 2017-05-15 DIAGNOSIS — M5137 Other intervertebral disc degeneration, lumbosacral region: Secondary | ICD-10-CM | POA: Insufficient documentation

## 2017-05-15 DIAGNOSIS — M5127 Other intervertebral disc displacement, lumbosacral region: Secondary | ICD-10-CM | POA: Insufficient documentation

## 2017-05-15 DIAGNOSIS — M545 Low back pain: Secondary | ICD-10-CM | POA: Diagnosis not present

## 2017-05-19 ENCOUNTER — Telehealth: Payer: Self-pay | Admitting: *Deleted

## 2017-05-19 MED ORDER — HYDROCODONE-ACETAMINOPHEN 5-325 MG PO TABS: 1.0000 | ORAL_TABLET | Freq: Four times a day (QID) | ORAL | 0 refills | Status: DC | PRN

## 2017-05-19 NOTE — Telephone Encounter (Signed)
[place], [place]ena Nunnery, MD  Six, [place] Lathe, LPN        Put into phone note   MRI shows the chronic disc bulge but may be causing some irritation of nerve on right side. Dr. Trenton Gammon will look at himself in more detail. Okay to refill the pain medication for now.  They may need to try epidural injection   Previous Messages    ----- Message -----  From: Eual Fines, LPN  Sent: 12/01/9415 10:16 AM  To: Alycia Rossetti, MD  Subject: Help Dr D                     I'm out of the pain medication. I was taking it in the am and after work most days because I was having trouble standing upright and with my back "catching" if I didn't.. It got so bad last week that I was out of work 3 days and I went to neurosurgery and he did another MRI. You can see the results I assume but Dr Annette Stable is out of the office until Friday but I'm going back to see him fri at 8am. 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 I still take the gabapentin but its not helping like it did. Can you tell from the MRI what is going on? I just want to cry I hurt so bad

## 2017-05-19 NOTE — Telephone Encounter (Signed)
Call placed to patient and patient made aware.  

## 2017-05-22 DIAGNOSIS — Z6826 Body mass index (BMI) 26.0-26.9, adult: Secondary | ICD-10-CM | POA: Diagnosis not present

## 2017-05-22 DIAGNOSIS — M5126 Other intervertebral disc displacement, lumbar region: Secondary | ICD-10-CM | POA: Diagnosis not present

## 2017-05-25 MED FILL — VENLAFAXINE HCL ER 75 MG CA: 75 | 30 days supply | Qty: 30 | Fill #0

## 2017-05-25 MED FILL — AMPHETAMINE SALTS 15 MG TAB: 15 | 30 days supply | Qty: 60 | Fill #0

## 2017-06-10 DIAGNOSIS — M5126 Other intervertebral disc displacement, lumbar region: Secondary | ICD-10-CM | POA: Diagnosis not present

## 2017-06-10 DIAGNOSIS — Z01812 Encounter for preprocedural laboratory examination: Secondary | ICD-10-CM | POA: Diagnosis not present

## 2017-06-13 ENCOUNTER — Telehealth: Payer: 59 | Admitting: Physician Assistant

## 2017-06-13 DIAGNOSIS — B9689 Other specified bacterial agents as the cause of diseases classified elsewhere: Secondary | ICD-10-CM

## 2017-06-13 DIAGNOSIS — J019 Acute sinusitis, unspecified: Secondary | ICD-10-CM

## 2017-06-13 MED ORDER — AMOXICILLIN-POT CLAVULANATE 875-125 MG PO TABS: 1.0000 | ORAL_TABLET | Freq: Two times a day (BID) | ORAL | 0 refills | Status: DC

## 2017-06-13 NOTE — Progress Notes (Signed)

## 2017-06-19 DIAGNOSIS — M5127 Other intervertebral disc displacement, lumbosacral region: Secondary | ICD-10-CM | POA: Diagnosis not present

## 2017-06-30 MED FILL — VENLAFAXINE HCL ER 75 MG CA: 75 | 30 days supply | Qty: 30 | Fill #1

## 2017-06-30 MED FILL — AMPHETAMINE SALTS 15 MG TAB: 15 | 30 days supply | Qty: 60 | Fill #0

## 2017-07-06 ENCOUNTER — Encounter: Payer: Self-pay | Admitting: Family Medicine

## 2017-07-06 MED ORDER — ZOLPIDEM TARTRATE 5 MG PO TABS: 5.0000 mg | ORAL_TABLET | Freq: Every evening | ORAL | 0 refills | Status: DC | PRN

## 2017-07-09 DIAGNOSIS — F411 Generalized anxiety disorder: Secondary | ICD-10-CM | POA: Diagnosis not present

## 2017-07-09 DIAGNOSIS — F319 Bipolar disorder, unspecified: Secondary | ICD-10-CM | POA: Diagnosis not present

## 2017-07-09 DIAGNOSIS — F902 Attention-deficit hyperactivity disorder, combined type: Secondary | ICD-10-CM | POA: Diagnosis not present

## 2017-07-17 ENCOUNTER — Ambulatory Visit: Payer: 59 | Attending: Neurosurgery | Admitting: Physical Therapy

## 2017-07-17 DIAGNOSIS — R262 Difficulty in walking, not elsewhere classified: Secondary | ICD-10-CM | POA: Insufficient documentation

## 2017-07-17 DIAGNOSIS — M5441 Lumbago with sciatica, right side: Secondary | ICD-10-CM | POA: Diagnosis not present

## 2017-07-17 DIAGNOSIS — M2569 Stiffness of other specified joint, not elsewhere classified: Secondary | ICD-10-CM

## 2017-07-17 DIAGNOSIS — M6281 Muscle weakness (generalized): Secondary | ICD-10-CM | POA: Insufficient documentation

## 2017-07-17 DIAGNOSIS — M2560 Stiffness of unspecified joint, not elsewhere classified: Secondary | ICD-10-CM | POA: Insufficient documentation

## 2017-07-17 NOTE — Therapy (Signed)
[facility] [place], [place], [address] Phone: [phone]   Fax:  [phone]  Physical Therapy Evaluation  Patient Details  Name: [name] MRN: [id number] Date of Birth: [birth year] Referring Provider: Earnie Larsson, MD  Encounter Date: 07/17/2017      PT End of Session - 07/17/17 1154    Visit Number 1   Number of Visits 16   Date for PT Re-Evaluation 09/16/17   PT Start Time 1037   PT Stop Time 1130   PT Time Calculation (min) 53 min   Activity Tolerance Patient tolerated treatment well   Behavior During Therapy [facility] for tasks assessed/performed      Past Medical History:  Diagnosis Date  . 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 Bipolar 1 disorder, mixed ([place])   . Bipolar disorder, mixed ([place])   . Depression   . Hair pulling   . HSIL on Pap smear of cervix 2013   [place]    Past Surgical History:  Procedure Laterality Date  . CHOLECYSTECTOMY    . TUBAL LIGATION      There were no vitals filed for this visit.       Subjective Assessment - 07/17/17 1148    Subjective Pt arriving to therapy complaining of midline low back pain with radiation down right leg into her big toe. Pt s/p disc replacement surgery on 06/19/17. Pt reports pain is 4/10 at present but can reach 20/94 at times with certain movements. Pt reporting increased pain with extension and with pressure such as sneezing and coughing.     Pertinent History Lumbar surgery on 06/19/17   Limitations House hold activities;Walking;Standing;Sitting;Lifting   How long can you sit comfortably? 10-15 minutes   How long can you stand comfortably? 5 minutes   How long can you walk comfortably? 15 minutes   Diagnostic tests following surgery   Currently in Pain? Yes   Pain Score 4    Pain Location Back   Pain Orientation Lower   Pain Descriptors / Indicators Aching;Throbbing   Pain Type Surgical pain   Pain Onset  More than a month ago   Pain Frequency Constant   Aggravating Factors  sititng long periods, standing long periods, getting off toilet   Pain Relieving Factors ice, moving out of certain positions   Effect of Pain on Daily Activities unable to work as an Therapist, sports, unable to perform some ADL's and household chores            [facility] PT Assessment - 07/17/17 0001      Assessment   Medical Diagnosis low back pain with R sciatica   Referring Provider Earnie Larsson, MD   Onset Date/Surgical Date 06/19/17   Hand Dominance Right   Next MD Visit following therapy   Prior Therapy not for this dx     Precautions   Precautions Back     Restrictions   Weight Bearing Restrictions No     Balance Screen   Has the patient fallen in the past 6 months No   Has the patient had a decrease in activity level because of a fear of falling?  Yes   Is the patient reluctant to leave their home because of a fear of falling?  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, sports, currently out on FMLA   Leisure hanging out with my children     Cognition   Overall Cognitive Status Within Functional Limits for tasks assessed     Observation/Other Assessments   Focus on Therapeutic Outcomes (FOTO)  65% limitation     ROM / Strength   AROM / PROM / Strength AROM;Strength     AROM   AROM Assessment Site Lumbar   Lumbar Flexion 80% limitation   Lumbar Extension 100% limitation   Lumbar - Right Side Bend 50% limitation   Lumbar - Left Side Bend 50% limitation   Lumbar - Right Rotation 50% limitation   Lumbar - Left Rotation 50% limitation     Strength   Overall Strength Deficits   Strength Assessment Site Hip;Knee   Right/Left Hip Right;Left   Right Hip Flexion 3+/5   Left Hip Flexion 3+/5    Right/Left Knee Right;Left   Right Knee Flexion 3+/5   Right Knee Extension 3+/5   Left Knee Flexion 3+/5   Left Knee Extension 3+/5     Palpation   SI assessment  Pt with increased pain with sacral compression, no obvious mal-rotation noted   Palpation comment Pt with tenderness noted along lumbar and sacral paraspinals     Special Tests    Special Tests Lumbar   Lumbar Tests Straight Leg Raise     Straight Leg Raise   Findings Positive   Side  Right     Transfers   Transfers Sit to Stand   Sit to Stand 6: Modified independent (Device/Increase time)     Ambulation/Gait   Ambulation/Gait Yes   Gait Pattern Step-through pattern;Decreased stance time - right;Decreased dorsiflexion - right;Antalgic            Objective measurements completed on examination: See above findings.                  PT Education - 07/17/17 1046    Education provided Yes   Education Details HEP   Person(s) Educated Patient   Methods Explanation;Demonstration;Handout;Verbal cues   Comprehension Verbalized understanding;Returned demonstration;Verbal cues required          PT Short Term Goals - 07/17/17 1220      PT SHORT TERM GOAL #1   Title Pt will be able to perform sit to stand x 5 with no UE support with pain less than 3/10.    Time 4   Period Weeks   Status New   Target Date 08/16/17           PT Long Term Goals - 07/17/17 1202      PT LONG TERM GOAL #1   Title Pt will be independent in her HEP and progressiong   Time 8   Period Weeks   Status New   Target Date 09/16/17     PT LONG TERM GOAL #2   Title pt will improve her FOTO score from 65% limitation to </= 50% limitation.    Time 8   Period Weeks   Status New   Target Date 09/16/17     PT LONG TERM GOAL #3   Title Pt will improve her bilateral LE quad and hamstring strength to >/= 4/5 in order to improve functional mobility.    Baseline 3+/5   Time 8   Period Weeks   Status New   Target  Date  09/16/17     PT LONG TERM GOAL #4   Title Pt will be able to amb with normalized gait pattern with step through gait and equalized step length >1000 feet on community level surfaces.    Time 8   Period Weeks   Status New   Target Date 09/16/17     PT LONG TERM GOAL #5   Title Pt will be able to lift 15 pounds from floor to table using correct body mechanics with pain less than 3/10.    Time 8   Period Weeks   Status New   Target Date 09/16/17                Plan - 07/17/17 1155    Clinical Impression Statement Pt presenting as a moderate complexity evaluation s/p lumbar surgery with radiating pain down her R LE into her right great toe. Pt reporting pain varies from 5/17 to 00/17 with certain movements. pt presenting with limited bilateral rotation by 50%, decreased bilateral quad and hamstring stength to 3+/5. pt with limited lumbar extension due to pain. Pt with pain with palpation over L1-sacrum  and over iliac crest. 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    Clinical Presentation Evolving   Clinical Presentation due to: surgical low back pain with radiation down right LE into great toe   Clinical Decision Making Moderate   Rehab Potential Good   PT Frequency 2x / week   PT Treatment/Interventions ADLs/Self Care Home Management;Patient/family education;Dry needling;Gait training;Functional mobility training;Therapeutic activities;Therapeutic exercise;Passive range of motion;Manual techniques;Moist Heat;Ultrasound;Electrical Stimulation;Cryotherapy;Stair training   PT Next Visit Plan Review HEP, add modalities to pt's tolerance   PT Home Exercise Plan PPT, bridges, SKTC, supine hip adduction, supine hip abduction with red theraband   Consulted and Agree with Plan of Care Patient      Patient will benefit from skilled therapeutic intervention in order to improve the following deficits and impairments:  Postural  dysfunction, Improper body mechanics, Decreased range of motion, Difficulty walking, Decreased mobility, Pain, Decreased strength, Decreased activity tolerance  Visit Diagnosis: Acute bilateral low back pain with right-sided sciatica  Difficulty in walking, not elsewhere classified  Muscle weakness (generalized)  Back stiffness     Problem List Patient Active Problem List   Diagnosis Date Noted  . Palpitations 06/30/2016  . ADD (attention deficit disorder) 01/29/2016  . Migraines 09/18/2015  . Lumbar back pain with radiculopathy affecting right lower extremity 07/31/2015  . HSIL (high grade squamous intraepithelial lesion) on Pap smear of cervix 08/22/2013  . Bipolar I disorder ([place]) 08/22/2013  . Anxiety state, unspecified 03/02/2013  . Bulging lumbar disc 08/17/2012    Oretha Caprice, MPT 07/17/2017, 12:21 PM  [facility] [place], [place], [address] Phone: [phone]   Fax:  [phone]  Name: [name] MRN: [id number] Date of Birth: [birth year]

## 2017-07-21 ENCOUNTER — Encounter: Payer: Self-pay | Admitting: Physical Therapy

## 2017-07-21 ENCOUNTER — Ambulatory Visit: Payer: 59 | Attending: Neurosurgery | Admitting: Physical Therapy

## 2017-07-21 DIAGNOSIS — M6281 Muscle weakness (generalized): Secondary | ICD-10-CM | POA: Insufficient documentation

## 2017-07-21 DIAGNOSIS — M5441 Lumbago with sciatica, right side: Secondary | ICD-10-CM | POA: Insufficient documentation

## 2017-07-21 DIAGNOSIS — R262 Difficulty in walking, not elsewhere classified: Secondary | ICD-10-CM | POA: Insufficient documentation

## 2017-07-21 DIAGNOSIS — M2560 Stiffness of unspecified joint, not elsewhere classified: Secondary | ICD-10-CM | POA: Diagnosis present

## 2017-07-21 NOTE — Therapy (Signed)
[facility] [place], [place], [address] Phone: [phone]   Fax:  [phone]  Physical Therapy Treatment  Patient Details  Name: [name] MRN: [id number] Date of Birth: 22-Mar-1981 Referring Provider: Earnie Larsson, MD  Encounter Date: 07/21/2017      PT End of Session - 07/21/17 1319    Visit Number 2   Number of Visits 16   Date for PT Re-Evaluation 09/16/17   PT Start Time 1115   PT Stop Time 1208   PT Time Calculation (min) 53 min   Activity Tolerance Patient tolerated treatment well   Behavior During Therapy [facility] for tasks assessed/performed      Past Medical History:  Diagnosis Date  . 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 Bipolar 1 disorder, mixed ([place])   . Bipolar disorder, mixed ([place])   . Depression   . Hair pulling   . HSIL on Pap smear of cervix 2013   [place]    Past Surgical History:  Procedure Laterality Date  . CHOLECYSTECTOMY    . TUBAL LIGATION      There were no vitals filed for this visit.      Subjective Assessment - 07/21/17 1320    Subjective Patient in a lot of pain upon presentation to the clinic today.   Pain Score 8    Pain Location Back   Pain Orientation Lower   Pain Descriptors / Indicators Aching;Throbbing   Pain Type Surgical pain   Pain Onset More than a month ago                         [facility] Adult PT Treatment/Exercise - 07/21/17 0001      Modalities   Modalities Electrical Stimulation;Moist Heat;Ultrasound     Moist Heat Therapy   Number Minutes Moist Heat 20 Minutes   Moist Heat Location Lumbar Spine     Electrical Stimulation   Electrical Stimulation Location Right low back.   Electrical Stimulation Action Pre-mod.   Electrical Stimulation Parameters 80-150 Hz x 20 minutes.   Electrical Stimulation Goals Pain     Ultrasound   Ultrasound Location Right SIJ region.   Ultrasound Parameters 1.50  w/CM2 x 12 minutes.   Ultrasound Goals Pain     Manual Therapy   Manual Therapy Soft tissue mobilization   Manual therapy comments Left sdly position with 2 pillows between knees:  STW/M to right SIJ region x 11 minutes.                  PT Short Term Goals - 07/17/17 1220      PT SHORT TERM GOAL #1   Title Pt will be able to perform sit to stand x 5 with no UE support with pain less than 3/10.    Time 4   Period Weeks   Status New   Target Date 08/16/17           PT Long Term Goals - 07/17/17 1202      PT LONG TERM GOAL #1   Title Pt will be independent in her HEP and progressiong   Time 8   Period Weeks   Status New   Target Date 09/16/17     PT LONG TERM GOAL #2   Title pt will improve her FOTO score from 65% limitation to </= 50% limitation.    Time 8  Period Weeks   Status New   Target Date 09/16/17     PT LONG TERM GOAL #3   Title Pt will improve her bilateral LE quad and hamstring strength to >/= 4/5 in order to improve functional mobility.    Baseline 3+/5   Time 8   Period Weeks   Status New   Target Date 09/16/17     PT LONG TERM GOAL #4   Title Pt will be able to amb with normalized gait pattern with step through gait and equalized step length >1000 feet on community level surfaces.    Time 8   Period Weeks   Status New   Target Date 09/16/17     PT LONG TERM GOAL #5   Title Pt will be able to lift 15 pounds from floor to table using correct body mechanics with pain less than 3/10.    Time 8   Period Weeks   Status New   Target Date 09/16/17               Plan - 07/21/17 1321    Clinical Impression Statement The patient in a lot of pain upon presentation to the clinic today walking in trunk flexion and sidebending.      Patient will benefit from skilled therapeutic intervention in order to improve the following deficits and impairments:  Postural dysfunction, Improper body mechanics, Decreased range of motion, Difficulty  walking, Decreased mobility, Pain, Decreased strength, Decreased activity tolerance  Visit Diagnosis: Acute bilateral low back pain with right-sided sciatica  Difficulty in walking, not elsewhere classified  Muscle weakness (generalized)     Problem List Patient Active Problem List   Diagnosis Date Noted  . Palpitations 06/30/2016  . ADD (attention deficit disorder) 01/29/2016  . Migraines 09/18/2015  . Lumbar back pain with radiculopathy affecting right lower extremity 07/31/2015  . HSIL (high grade squamous intraepithelial lesion) on Pap smear of cervix 08/22/2013  . Bipolar I disorder ([place]) 08/22/2013  . Anxiety state, unspecified 03/02/2013  . Bulging lumbar disc 08/17/2012    Eulice Rutledge, [place] MPT 07/21/2017, 2:53 PM  [facility] [address] [place], [place], [address] Phone: [phone]   Fax:  [phone]  Name: [name] MRN: [id number] Date of Birth: 22-Apr-1981

## 2017-07-23 ENCOUNTER — Ambulatory Visit: Payer: 59 | Admitting: Physical Therapy

## 2017-07-23 DIAGNOSIS — M5441 Lumbago with sciatica, right side: Secondary | ICD-10-CM | POA: Diagnosis not present

## 2017-07-23 DIAGNOSIS — M6281 Muscle weakness (generalized): Secondary | ICD-10-CM | POA: Diagnosis not present

## 2017-07-23 DIAGNOSIS — R262 Difficulty in walking, not elsewhere classified: Secondary | ICD-10-CM | POA: Diagnosis not present

## 2017-07-23 DIAGNOSIS — M2560 Stiffness of unspecified joint, not elsewhere classified: Secondary | ICD-10-CM

## 2017-07-23 DIAGNOSIS — M2569 Stiffness of other specified joint, not elsewhere classified: Secondary | ICD-10-CM

## 2017-07-23 NOTE — Therapy (Signed)
[facility] [place], [place], [address] Phone: [phone]   Fax:  [phone]  Physical Therapy Treatment  Patient Details  Name: [name] MRN: [id number] Date of Birth: 1981-08-08 Referring Provider: Earnie Larsson, MD  Encounter Date: 07/23/2017      PT End of Session - 07/23/17 1645    Visit Number 3   Number of Visits 16   Date for PT Re-Evaluation 09/16/17   PT Start Time 0145   PT Stop Time 0244   PT Time Calculation (min) 59 min   Activity Tolerance Patient tolerated treatment well   Behavior During Therapy [facility] for tasks assessed/performed      Past Medical History:  Diagnosis Date  . 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 Bipolar 1 disorder, mixed ([place])   . Bipolar disorder, mixed ([place])   . Depression   . Hair pulling   . HSIL on Pap smear of cervix 2013   [place]    Past Surgical History:  Procedure Laterality Date  . CHOLECYSTECTOMY    . TUBAL LIGATION      There were no vitals filed for this visit.      Subjective Assessment - 07/23/17 1612    Subjective That treatment helped a lot.   Pain Score 5    Pain Location Back   Pain Orientation Lower   Pain Descriptors / Indicators Aching;Throbbing   Pain Type Surgical pain   Pain Onset More than a month ago                         [facility] Adult PT Treatment/Exercise - 07/23/17 0001      Modalities   Modalities Electrical Stimulation;Moist Heat     Moist Heat Therapy   Number Minutes Moist Heat 20 Minutes   Moist Heat Location Lumbar Spine     Electrical Stimulation   Electrical Stimulation Location Right low back.   Electrical Stimulation Action Pre-mod.   Electrical Stimulation Parameters 80-150 Hz x 20 minutes.   Electrical Stimulation Goals Pain     Ultrasound   Ultrasound Location Right SIJ region.   Ultrasound Parameters 1.50 W/CM2 x 12 minutes in left sdly position with  folded pillow between knees.   Ultrasound Goals Pain     Manual Therapy   Manual Therapy Soft tissue mobilization   Manual therapy comments STW/M and right QL release technique x 18 minutes.                  PT Short Term Goals - 07/17/17 1220      PT SHORT TERM GOAL #1   Title Pt will be able to perform sit to stand x 5 with no UE support with pain less than 3/10.    Time 4   Period Weeks   Status New   Target Date 08/16/17           PT Long Term Goals - 07/17/17 1202      PT LONG TERM GOAL #1   Title Pt will be independent in her HEP and progressiong   Time 8   Period Weeks   Status New   Target Date 09/16/17     PT LONG TERM GOAL #2   Title pt will improve her FOTO score from 65% limitation to </= 50% limitation.    Time 8   Period Weeks   Status  New   Target Date 09/16/17     PT LONG TERM GOAL #3   Title Pt will improve her bilateral LE quad and hamstring strength to >/= 4/5 in order to improve functional mobility.    Baseline 3+/5   Time 8   Period Weeks   Status New   Target Date 09/16/17     PT LONG TERM GOAL #4   Title Pt will be able to amb with normalized gait pattern with step through gait and equalized step length >1000 feet on community level surfaces.    Time 8   Period Weeks   Status New   Target Date 09/16/17     PT LONG TERM GOAL #5   Title Pt will be able to lift 15 pounds from floor to table using correct body mechanics with pain less than 3/10.    Time 8   Period Weeks   Status New   Target Date 09/16/17               Plan - 07/23/17 1613    Clinical Impression Statement Good response to treatment.  Patient almost able to stand in normal upright posture.      Patient will benefit from skilled therapeutic intervention in order to improve the following deficits and impairments:  Postural dysfunction, Improper body mechanics, Decreased range of motion, Difficulty walking, Decreased mobility, Pain, Decreased  strength, Decreased activity tolerance  Visit Diagnosis: Acute bilateral low back pain with right-sided sciatica  Difficulty in walking, not elsewhere classified  Muscle weakness (generalized)  Back stiffness     Problem List Patient Active Problem List   Diagnosis Date Noted  . Palpitations 06/30/2016  . ADD (attention deficit disorder) 01/29/2016  . Migraines 09/18/2015  . Lumbar back pain with radiculopathy affecting right lower extremity 07/31/2015  . HSIL (high grade squamous intraepithelial lesion) on Pap smear of cervix 08/22/2013  . Bipolar I disorder ([place]) 08/22/2013  . Anxiety state, unspecified 03/02/2013  . Bulging lumbar disc 08/17/2012    Kenney Going, [place] MPT 07/23/2017, 4:47 PM  [facility] [place], [place], [address] Phone: [phone]   Fax:  [phone]  Name: [name] MRN: [id number] Date of Birth: Jun 18, 1981

## 2017-07-28 ENCOUNTER — Encounter: Payer: Self-pay | Admitting: Physical Therapy

## 2017-07-28 ENCOUNTER — Ambulatory Visit: Payer: 59 | Admitting: Physical Therapy

## 2017-07-28 DIAGNOSIS — M2560 Stiffness of unspecified joint, not elsewhere classified: Secondary | ICD-10-CM

## 2017-07-28 DIAGNOSIS — M5441 Lumbago with sciatica, right side: Secondary | ICD-10-CM | POA: Diagnosis not present

## 2017-07-28 DIAGNOSIS — R262 Difficulty in walking, not elsewhere classified: Secondary | ICD-10-CM | POA: Diagnosis not present

## 2017-07-28 DIAGNOSIS — M6281 Muscle weakness (generalized): Secondary | ICD-10-CM | POA: Diagnosis not present

## 2017-07-28 DIAGNOSIS — M2569 Stiffness of other specified joint, not elsewhere classified: Secondary | ICD-10-CM

## 2017-07-28 MED FILL — AMPHETAMINE SALTS 15 MG TAB: 15 | 30 days supply | Qty: 60 | Fill #0

## 2017-07-28 NOTE — Therapy (Signed)
[facility] [place], [place], [address] Phone: [phone]   Fax:  [phone]  Physical Therapy Treatment  Patient Details  Name: [name] MRN: [id number] Date of Birth: Sep 26, 1981 Referring Provider: Earnie Larsson, MD  Encounter Date: 07/28/2017      PT End of Session - 07/28/17 1307    Visit Number 4   Number of Visits 16   Date for PT Re-Evaluation 09/16/17   PT Start Time 3220   PT Stop Time 1348   PT Time Calculation (min) 40 min   Activity Tolerance Patient tolerated treatment well   Behavior During Therapy [facility] for tasks assessed/performed      Past Medical History:  Diagnosis Date  . 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 Bipolar 1 disorder, mixed ([place])   . Bipolar disorder, mixed ([place])   . Depression   . Hair pulling   . HSIL on Pap smear of cervix 2013   [name]    Past Surgical History:  Procedure Laterality Date  . CHOLECYSTECTOMY    . TUBAL LIGATION      There were no vitals filed for this visit.      Subjective Assessment - 07/28/17 1306    Subjective Reports that when she weightshifts to the R then shooting pain down R low back. Has really been aching from R knee down. Reports if she leans forward while sitting her low back will catch. Patient reports upon standing after sitting or lying down for a period of time that she has to pause upon standing as her LEs shake.   Pertinent History Lumbar surgery on 06/19/17   Limitations House hold activities;Walking;Standing;Sitting;Lifting   How long can you sit comfortably? 10-15 minutes   How long can you stand comfortably? 5 minutes   How long can you walk comfortably? 15 minutes   Diagnostic tests following surgery   Currently in Pain? Yes   Pain Score 9    Pain Location Back   Pain Orientation Lower;Right   Pain Descriptors / Indicators Sharp   Pain Type Surgical pain   Pain Onset More than a month  ago   Pain Frequency Intermittent            [facility] PT Assessment - 07/28/17 0001      Assessment   Medical Diagnosis low back pain with R sciatica   Onset Date/Surgical Date 06/19/17   Hand Dominance Right   Next MD Visit 08/11/2017   Prior Therapy not for this dx     Precautions   Precautions Back     Restrictions   Weight Bearing Restrictions No                     [facility] Adult PT Treatment/Exercise - 07/28/17 0001      Modalities   Modalities Electrical Stimulation;Moist Heat;Ultrasound     Moist Heat Therapy   Number Minutes Moist Heat 15 Minutes   Moist Heat Location Lumbar Spine     Electrical Stimulation   Electrical Stimulation Location Right low back.   Electrical Stimulation Action Pre-Mod   Electrical Stimulation Parameters 80-150 hz x15 min   Electrical Stimulation Goals Pain;Tone     Ultrasound   Ultrasound Location R low back   Ultrasound Parameters 1.5 w/cm2, 100%, 1 mhz x10 min   Ultrasound Goals Pain     Manual Therapy   Manual Therapy Soft tissue mobilization  Soft tissue mobilization STW to R lumbar paraspinals, superior glute, QL to reduce tightness and pain in L sidelying                  PT Short Term Goals - 07/17/17 1220      PT SHORT TERM GOAL #1   Title Pt will be able to perform sit to stand x 5 with no UE support with pain less than 3/10.    Time 4   Period Weeks   Status New   Target Date 08/16/17           PT Long Term Goals - 07/17/17 1202      PT LONG TERM GOAL #1   Title Pt will be independent in her HEP and progressiong   Time 8   Period Weeks   Status New   Target Date 09/16/17     PT LONG TERM GOAL #2   Title pt will improve her FOTO score from 65% limitation to </= 50% limitation.    Time 8   Period Weeks   Status New   Target Date 09/16/17     PT LONG TERM GOAL #3   Title Pt will improve her bilateral LE quad and hamstring strength to >/= 4/5 in order to improve functional  mobility.    Baseline 3+/5   Time 8   Period Weeks   Status New   Target Date 09/16/17     PT LONG TERM GOAL #4   Title Pt will be able to amb with normalized gait pattern with step through gait and equalized step length >1000 feet on community level surfaces.    Time 8   Period Weeks   Status New   Target Date 09/16/17     PT LONG TERM GOAL #5   Title Pt will be able to lift 15 pounds from floor to table using correct body mechanics with pain less than 3/10.    Time 8   Period Weeks   Status New   Target Date 09/16/17               Plan - 07/28/17 1339    Clinical Impression Statement Patient presented in clinic with reports of increased R low back pain and catching sensation with aching sensation from R knee to ankle. Patient presented in clinic with increased muscle tightness along superior glute region as well as R QL region. Patient initially very sensitive to [place] head contact and manual therapy especially along R lumbar paraspinals. Normal modalities response noted following removal of the modalities. Entire treatment completed in L sidelying with one pillow between her knees.   Rehab Potential Good   PT Frequency 2x / week   PT Treatment/Interventions ADLs/Self Care Home Management;Patient/family education;Dry needling;Gait training;Functional mobility training;Therapeutic activities;Therapeutic exercise;Passive range of motion;Manual techniques;Moist Heat;Ultrasound;Electrical Stimulation;Cryotherapy;Stair training   PT Next Visit Plan Review HEP, add modalities to pt's tolerance   PT Home Exercise Plan PPT, bridges, SKTC, supine hip adduction, supine hip abduction with red theraband   Consulted and Agree with Plan of Care Patient      Patient will benefit from skilled therapeutic intervention in order to improve the following deficits and impairments:  Postural dysfunction, Improper body mechanics, Decreased range of motion, Difficulty walking, Decreased mobility,  Pain, Decreased strength, Decreased activity tolerance  Visit Diagnosis: Acute bilateral low back pain with right-sided sciatica  Difficulty in walking, not elsewhere classified  Muscle weakness (generalized)  Back stiffness  Problem List Patient Active Problem List   Diagnosis Date Noted  . Palpitations 06/30/2016  . ADD (attention deficit disorder) 01/29/2016  . Migraines 09/18/2015  . Lumbar back pain with radiculopathy affecting right lower extremity 07/31/2015  . HSIL (high grade squamous intraepithelial lesion) on Pap smear of cervix 08/22/2013  . Bipolar I disorder ([place]) 08/22/2013  . Anxiety state, unspecified 03/02/2013  . Bulging lumbar disc 08/17/2012    Wynelle Fanny, PTA 07/28/2017, 1:58 PM  [facility] [address] [place], [place], [address] Phone: [phone]   Fax:  [phone]  Name: SAMADHI MAHURIN MRN: [id number] Date of Birth: Mar 22, 1981

## 2017-07-30 ENCOUNTER — Encounter: Payer: Self-pay | Admitting: Physical Therapy

## 2017-07-31 ENCOUNTER — Ambulatory Visit: Payer: Self-pay | Admitting: Family Medicine

## 2017-08-03 ENCOUNTER — Ambulatory Visit: Payer: 59 | Admitting: Physical Therapy

## 2017-08-03 DIAGNOSIS — M2560 Stiffness of unspecified joint, not elsewhere classified: Secondary | ICD-10-CM

## 2017-08-03 DIAGNOSIS — M2569 Stiffness of other specified joint, not elsewhere classified: Secondary | ICD-10-CM

## 2017-08-03 DIAGNOSIS — M6281 Muscle weakness (generalized): Secondary | ICD-10-CM | POA: Diagnosis not present

## 2017-08-03 DIAGNOSIS — R262 Difficulty in walking, not elsewhere classified: Secondary | ICD-10-CM | POA: Diagnosis not present

## 2017-08-03 DIAGNOSIS — M5441 Lumbago with sciatica, right side: Secondary | ICD-10-CM | POA: Diagnosis not present

## 2017-08-04 ENCOUNTER — Encounter: Payer: Self-pay | Admitting: Family Medicine

## 2017-08-04 ENCOUNTER — Ambulatory Visit (INDEPENDENT_AMBULATORY_CARE_PROVIDER_SITE_OTHER): Payer: 59 | Admitting: Family Medicine

## 2017-08-04 VITALS — BP 110/76 | HR 124 | Temp 99.1°F | Resp 16 | Ht 65.0 in | Wt 161.0 lb

## 2017-08-04 DIAGNOSIS — Z9889 Other specified postprocedural states: Secondary | ICD-10-CM | POA: Diagnosis not present

## 2017-08-04 DIAGNOSIS — M5431 Sciatica, right side: Secondary | ICD-10-CM | POA: Diagnosis not present

## 2017-08-04 MED ORDER — NAPROXEN 500 MG PO TABS: 500.0000 mg | ORAL_TABLET | Freq: Two times a day (BID) | ORAL | 1 refills | Status: DC

## 2017-08-04 NOTE — Patient Instructions (Signed)
Take the naproxen  F/U pending results

## 2017-08-04 NOTE — Progress Notes (Signed)
   Subjective:    Patient ID: [name], female    DOB: [birth year], 36 y.o.   MRN: [id number]  Patient presents for Back Pain (states that her back is locking up after recent surgery)         Patient here with back pain she is current physical therapy status post surgery on 8/31with lumbar discectomy. She was last seen on 9/26 she continues to have back pain which she feels like it locks up on her. The note states that she continues to have some mechanical back pain recommended physical therapy and lumbar support She is on hydrocodone but this does not help She is on gabapentin 300mg  TID which helps some She has tried multiple muscle relaxersand valium No change in bowel or bladder She has seen her surgeon- Dr. Trenton Gammon 3 times, she has been told it is part of healing process but feels something more is wrong   [phone]  Review Of Systems:  GEN- denies fatigue, fever, weight loss,weakness, recent illness HEENT- denies eye drainage, change in vision, nasal discharge, CVS- denies chest pain, palpitations RESP- denies SOB, cough, wheeze ABD- denies N/V, change in stools, abd pain GU- denies dysuria, hematuria, dribbling, incontinence MSK- + joint pain, +muscle aches, injury Neuro- denies headache, dizziness, syncope, seizure activity       Objective:    BP 110/76   Pulse (!) 124   Temp 99.1 F (37.3 C) (Oral)   Resp 16   Ht 5\' 5"  (1.651 m)   Wt 161 lb (73 kg)   SpO2 99%   BMI 26.79 kg/m  GEN- NAD, alert and oriented x3 CVS- mild tachcardia- HR 100-110 (pain mediated)  MSK- TTP right paraspinals 1 inch above surgical scar, mild TTP on lumbar spine, decreased ROM lumbar spine NEURO- Antalgic gait, tone in tact, strength decreased RLE compared to left  EXT- No edema Pulses- Radial 2+        Assessment & Plan:      Problem List Items Addressed This Visit    None    Visit Diagnoses    S/P diskectomy    -  Primary   S/P surgery still with significant back pain,  catching episodes, weakness in RLE. She is in PT also noted in their notes. She has some muscular component. But I am concenred about the weakness, severe pain s/p surgery. Not sure if repeat MRI would be beneficial. I will discuss with her surgeon directly.  I did add naprosyn for inflammation.    Back pain with right-sided sciatica       Relevant Medications   ziprasidone (GEODON) 20 MG capsule   gabapentin (NEURONTIN) 300 MG capsule   naproxen (NAPROSYN) 500 MG tablet      Note: This dictation was prepared with Dragon dictation along with smaller phrase technology. Any transcriptional errors that result from this process are unintentional.

## 2017-08-05 ENCOUNTER — Encounter: Payer: Self-pay | Admitting: Physical Therapy

## 2017-08-05 NOTE — Therapy (Signed)
[facility] [place], [place], [address] Phone: [phone]   Fax:  [phone]  Physical Therapy Treatment  Patient Details  Name: [name] MRN: [id number] Date of Birth: [birth year] Referring Provider: Earnie Larsson, MD  Encounter Date: 08/03/2017    Past Medical History:  Diagnosis Date  . 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 Bipolar 1 disorder, mixed ([place])   . Bipolar disorder, mixed ([place])   . Depression   . Hair pulling   . HSIL on Pap smear of cervix 2013   [place]    Past Surgical History:  Procedure Laterality Date  . CHOLECYSTECTOMY    . TUBAL LIGATION      There were no vitals filed for this visit.    Treatment: Abdominal sets in supine  STW to R QL, lumbar paraspinals, superior gluteal region Electrical Stimulation: to R lumbar region, IFC 80-150 Hz,  X 15 minutes, intensity to tolerance  See flow sheets for further details                         PT Education - 08/05/17 1430    Education Details posture and movement strategies   Person(s) Educated Patient   Methods Explanation;Demonstration   Comprehension Verbalized understanding;Returned demonstration          PT Short Term Goals - 07/17/17 1220      PT SHORT TERM GOAL #1   Title Pt will be able to perform sit to stand x 5 with no UE support with pain less than 3/10.    Time 4   Period Weeks   Status New   Target Date 08/16/17           PT Long Term Goals - 08/05/17 1433      PT LONG TERM GOAL #1   Title Pt will be independent in her HEP and progressiong   Time 8   Period Weeks   Status New     PT LONG TERM GOAL #2   Title pt will improve her FOTO score from 65% limitation to </= 50% limitation.    Time 8   Period Weeks   Status New     PT LONG TERM GOAL #3   Title Pt will improve her bilateral LE quad and hamstring strength to >/= 4/5 in order to  improve functional mobility.    Baseline 3+/5   Period Weeks   Status New     PT LONG TERM GOAL #4   Title Pt will be able to amb with normalized gait pattern with step through gait and equalized step length >1000 feet on community level surfaces.    Time 8   Period Weeks   Status New     PT LONG TERM GOAL #5   Title Pt will be able to lift 15 pounds from floor to table using correct body mechanics with pain less than 3/10.    Time 8   Period Weeks   Status New             Patient will benefit from skilled therapeutic intervention in order to improve the following deficits and impairments:  Postural dysfunction, Improper body mechanics, Decreased range of motion, Difficulty walking, Decreased mobility, Pain, Decreased strength, Decreased activity tolerance  Visit Diagnosis: Acute bilateral low back pain with right-sided sciatica  Difficulty in walking, not elsewhere classified  Muscle weakness (  generalized)  Back stiffness     Problem List Patient Active Problem List   Diagnosis Date Noted  . Palpitations 06/30/2016  . ADD (attention deficit disorder) 01/29/2016  . Migraines 09/18/2015  . Lumbar back pain with radiculopathy affecting right lower extremity 07/31/2015  . HSIL (high grade squamous intraepithelial lesion) on Pap smear of cervix 08/22/2013  . Bipolar I disorder ([place]) 08/22/2013  . Anxiety state, unspecified 03/02/2013  . Bulging lumbar disc 08/17/2012    Oretha Caprice, MPT 08/05/2017, 2:38 PM  [facility] [place], [place], [address] Phone: [phone]   Fax:  [phone]  Name: [name] MRN: [id number] Date of Birth: [birth year]

## 2017-08-06 ENCOUNTER — Ambulatory Visit: Payer: 59 | Admitting: Physical Therapy

## 2017-08-06 ENCOUNTER — Encounter: Payer: Self-pay | Admitting: Physical Therapy

## 2017-08-06 DIAGNOSIS — M2560 Stiffness of unspecified joint, not elsewhere classified: Secondary | ICD-10-CM

## 2017-08-06 DIAGNOSIS — M2569 Stiffness of other specified joint, not elsewhere classified: Secondary | ICD-10-CM

## 2017-08-06 DIAGNOSIS — M5441 Lumbago with sciatica, right side: Secondary | ICD-10-CM

## 2017-08-06 DIAGNOSIS — M6281 Muscle weakness (generalized): Secondary | ICD-10-CM

## 2017-08-06 DIAGNOSIS — R262 Difficulty in walking, not elsewhere classified: Secondary | ICD-10-CM

## 2017-08-06 NOTE — Therapy (Signed)
[facility] [place], [place], [address] Phone: [phone]   Fax:  [phone]  Physical Therapy Treatment  Patient Details  Name: [name] MRN: [id number] Date of Birth: 1981/05/06 Referring Provider: Earnie Larsson, MD  Encounter Date: 08/06/2017      PT End of Session - 08/06/17 1515    Visit Number 6   Number of Visits 16   Date for PT Re-Evaluation 09/16/17   PT Start Time 4315   PT Stop Time 1530   PT Time Calculation (min) 59 min   Activity Tolerance Patient tolerated treatment well   Behavior During Therapy [facility] for tasks assessed/performed      Past Medical History:  Diagnosis Date  . 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 Bipolar 1 disorder, mixed ([place])   . Bipolar disorder, mixed ([place])   . Depression   . Hair pulling   . HSIL on Pap smear of cervix 2013   [facility]    Past Surgical History:  Procedure Laterality Date  . CHOLECYSTECTOMY    . TUBAL LIGATION      There were no vitals filed for this visit.      Subjective Assessment - 08/06/17 1440    Subjective Patient reported ongoing pain in back   Pertinent History Lumbar surgery on 06/19/17   Limitations House hold activities;Walking;Standing;Sitting;Lifting   How long can you sit comfortably? 10-15 minutes   How long can you stand comfortably? 5 minutes   How long can you walk comfortably? 15 minutes   Diagnostic tests following surgery   Currently in Pain? Yes   Pain Score 8    Pain Location Back   Pain Orientation Right;Lower  recent left pain radiate to thigh   Pain Descriptors / Indicators Sharp   Pain Type Surgical pain   Pain Onset More than a month ago   Pain Frequency Intermittent   Aggravating Factors  movement    Pain Relieving Factors rest                         [facility] Adult PT Treatment/Exercise - 08/06/17 0001      Self-Care   Self-Care ADL's;Lifting;Posture   ADL's cooking, cleaning, standing, brushing teeth, luandry   Lifting power lift, golf lift and positioning   Posture all positions, sit stand, and laying in bed     Lumbar Exercises: Supine   Ab Set 10 reps;3 seconds   AB Set Limitations nutral spine only any tilt or press down increased pain(tight only)   Glut Set 10 reps;3 seconds     Moist Heat Therapy   Number Minutes Moist Heat 15 Minutes   Moist Heat Location Lumbar Spine  seated in chair     Electrical Stimulation   Electrical Stimulation Location right low back   Electrical Stimulation Action IFC   Electrical Stimulation Parameters 80-150hz  x92min   Electrical Stimulation Goals Tone;Pain  seated in chair     Ultrasound   Ultrasound Location right loiw back   Ultrasound Parameters 1.3w/cm2/50%/34mhz x63min    Ultrasound Goals Pain  seated     Manual Therapy   Manual Therapy Soft tissue mobilization   Manual therapy comments gentle STW to reduce pain    Soft tissue mobilization STW to lumbar paraspinals and R QL  seated                PT  Education - 08/05/17 1430    Education Details posture and movement strategies   Person(s) Educated Patient   Methods Explanation;Demonstration   Comprehension Verbalized understanding;Returned demonstration          PT Short Term Goals - 08/06/17 1516      PT SHORT TERM GOAL #1   Title Pt will be able to perform sit to stand x 5 with no UE support with pain less than 3/10.    Time 4   Period Weeks   Status On-going           PT Long Term Goals - 08/06/17 1516      PT LONG TERM GOAL #1   Title Pt will be independent in her HEP and progressiong   Time 8   Period Weeks   Status On-going     PT LONG TERM GOAL #2   Title pt will improve her FOTO score from 65% limitation to </= 50% limitation.    Time 8   Period Weeks   Status On-going     PT LONG TERM GOAL #3   Title Pt will improve her bilateral LE quad and hamstring strength to >/= 4/5 in order to  improve functional mobility.    Baseline 3+/5   Period Weeks   Status On-going     PT LONG TERM GOAL #4   Title Pt will be able to amb with normalized gait pattern with step through gait and equalized step length >1000 feet on community level surfaces.    Time 8   Period Weeks   Status On-going     PT LONG TERM GOAL #5   Title Pt will be able to lift 15 pounds from floor to table using correct body mechanics with pain less than 3/10.    Time 8   Period Weeks   Status On-going               Plan - 08/06/17 1517    Clinical Impression Statement Patient progressing slowly due to ongoing discomfort in back. Today patient tolerated treatment fair and did well with core activation exercises yet required nuetral spine with no press down only tighten glut and core for activation. Only able to perform gentle low level exercise at this time. Educated patient on core activation and posture awareness techniques in all positions and with ADL's. Patient would like to avoid surgury and is limited with visits before next dr appt. Current goals ongoing due to pain deficts.    Rehab Potential Good   PT Frequency 2x / week   PT Treatment/Interventions ADLs/Self Care Home Management;Patient/family education;Dry needling;Gait training;Functional mobility training;Therapeutic activities;Therapeutic exercise;Passive range of motion;Manual techniques;Moist Heat;Ultrasound;Electrical Stimulation;Cryotherapy;Stair training   PT Next Visit Plan cont with gentle core activation/education on posture core protection and modalities to reduce pain, in sitting or comfortable position for patient. MD. note next visit.   Consulted and Agree with Plan of Care Patient      Patient will benefit from skilled therapeutic intervention in order to improve the following deficits and impairments:  Postural dysfunction, Improper body mechanics, Decreased range of motion, Difficulty walking, Decreased mobility, Pain,  Decreased strength, Decreased activity tolerance  Visit Diagnosis: Acute bilateral low back pain with right-sided sciatica  Difficulty in walking, not elsewhere classified  Muscle weakness (generalized)  Back stiffness     Problem List Patient Active Problem List   Diagnosis Date Noted  . Palpitations 06/30/2016  . ADD (attention deficit disorder) 01/29/2016  . Migraines 09/18/2015  .  Lumbar back pain with radiculopathy affecting right lower extremity 07/31/2015  . HSIL (high grade squamous intraepithelial lesion) on Pap smear of cervix 08/22/2013  . Bipolar I disorder ([place]) 08/22/2013  . Anxiety state, unspecified 03/02/2013  . Bulging lumbar disc 08/17/2012    Jayliah Benett P, PTA 08/06/2017, 3:30 PM  [facility] [place], [place], [address] Phone: [phone]   Fax:  [phone]  Name: JAMARIAH [name] MRN: [id number] Date of Birth: [birth year]

## 2017-08-07 ENCOUNTER — Ambulatory Visit: Payer: 59 | Admitting: *Deleted

## 2017-08-07 DIAGNOSIS — M6281 Muscle weakness (generalized): Secondary | ICD-10-CM

## 2017-08-07 DIAGNOSIS — M5441 Lumbago with sciatica, right side: Secondary | ICD-10-CM | POA: Diagnosis not present

## 2017-08-07 DIAGNOSIS — R262 Difficulty in walking, not elsewhere classified: Secondary | ICD-10-CM

## 2017-08-07 DIAGNOSIS — M2569 Stiffness of other specified joint, not elsewhere classified: Secondary | ICD-10-CM

## 2017-08-07 DIAGNOSIS — M2560 Stiffness of unspecified joint, not elsewhere classified: Secondary | ICD-10-CM | POA: Diagnosis not present

## 2017-08-07 NOTE — Therapy (Addendum)
[facility] [place], [place], [address] Phone: [phone]   Fax:  [phone]  Physical Therapy Treatment  Patient Details  Name: [name] MRN: [id number] Date of Birth: 06-10-1981 Referring Provider: Earnie Larsson, MD  Encounter Date: 08/07/2017      PT End of Session - 08/07/17 0952    Visit Number 7   Number of Visits 16   Date for PT Re-Evaluation 09/16/17   PT Start Time 0900   PT Stop Time 0950   PT Time Calculation (min) 50 min      Past Medical History:  Diagnosis Date  . 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 Bipolar 1 disorder, mixed ([place])   . Bipolar disorder, mixed ([place])   . Depression   . Hair pulling   . HSIL on Pap smear of cervix 2013   [place]    Past Surgical History:  Procedure Laterality Date  . CHOLECYSTECTOMY    . TUBAL LIGATION      There were no vitals filed for this visit.      Subjective Assessment - 08/07/17 0906    Subjective Patient reported ongoing pain in back   Pertinent History Lumbar surgery on 06/19/17   Limitations House hold activities;Walking;Standing;Sitting;Lifting   How long can you sit comfortably? 10-15 minutes   How long can you stand comfortably? 5 minutes   How long can you walk comfortably? 15 minutes   Diagnostic tests following surgery   Currently in Pain? Yes   Pain Score 8    Pain Location Back   Pain Orientation Right;Lower   Pain Descriptors / Indicators Sharp   Pain Onset More than a month ago                         [facility] Adult PT Treatment/Exercise - 08/07/17 0001      Exercises   Exercises Lumbar     Lumbar Exercises: Supine   Ab Set 3 seconds;15 reps   Glut Set 10 reps;3 seconds   Bent Knee Raise 10 reps   Other Supine Lumbar Exercises gentle pelvic tilts for mobility     Moist Heat Therapy   Number Minutes Moist Heat 15 Minutes   Moist Heat Location Lumbar Spine      Electrical Stimulation   Electrical Stimulation Location  low back  paras IFC 80-150hz x 15 mins   Electrical Stimulation Goals Tone;Pain     Ultrasound   Ultrasound Location RT LB paras 1.5 w/cm2 x 10 mins in LT sidelying   Ultrasound Goals Pain     Manual Therapy   Manual Therapy Soft tissue mobilization   Manual therapy comments gentle STW to reduce pain    Soft tissue mobilization STW to lumbar paraspinals and R QL      Body mechanics reviewed for transitional movements.             PT Short Term Goals - 08/06/17 1516      PT SHORT TERM GOAL #1   Title Pt will be able to perform sit to stand x 5 with no UE support with pain less than 3/10.    Time 4   Period Weeks   Status On-going           PT Long Term Goals - 08/06/17 1516      PT LONG TERM GOAL #1   Title Pt  will be independent in her HEP and progressiong   Time 8   Period Weeks   Status On-going     PT LONG TERM GOAL #2   Title pt will improve her FOTO score from 65% limitation to </= 50% limitation.    Time 8   Period Weeks   Status On-going     PT LONG TERM GOAL #3   Title Pt will improve her bilateral LE quad and hamstring strength to >/= 4/5 in order to improve functional mobility.    Baseline 3+/5   Period Weeks   Status On-going     PT LONG TERM GOAL #4   Title Pt will be able to amb with normalized gait pattern with step through gait and equalized step length >1000 feet on community level surfaces.    Time 8   Period Weeks   Status On-going     PT LONG TERM GOAL #5   Title Pt will be able to lift 15 pounds from floor to table using correct body mechanics with pain less than 3/10.    Time 8   Period Weeks   Status On-going               Plan - 08/07/17 1005    Clinical Impression Statement Pt arrived today doing fairly well.  She ambulates still with mild flexed posture, but was able to stand  errect with cues. Rx focused on bodymechanics and core activation as well as  modalities for pain. Pt  is still fairly guarded with movements due to expectations of sharp catching pain.   Clinical Presentation Evolving   Clinical Decision Making Moderate   Rehab Potential Good   PT Frequency 2x / week   PT Treatment/Interventions ADLs/Self Care Home Management;Patient/family education;Dry needling;Gait training;Functional mobility training;Therapeutic activities;Therapeutic exercise;Passive range of motion;Manual techniques;Moist Heat;Ultrasound;Electrical Stimulation;Cryotherapy;Stair training   PT Next Visit Plan cont with gentle core activation/education on posture core protection and modalities to reduce pain, in sitting or comfortable position for patient. MD. note next visit.   PT Home Exercise Plan PPT, bridges, SKTC, supine hip adduction, supine hip abduction with red theraband   Consulted and Agree with Plan of Care Patient      Patient will benefit from skilled therapeutic intervention in order to improve the following deficits and impairments:  Postural dysfunction, Improper body mechanics, Decreased range of motion, Difficulty walking, Decreased mobility, Pain, Decreased strength, Decreased activity tolerance  Visit Diagnosis: Acute bilateral low back pain with right-sided sciatica  Difficulty in walking, not elsewhere classified  Muscle weakness (generalized)  Back stiffness     Problem List Patient Active Problem List   Diagnosis Date Noted  . Palpitations 06/30/2016  . ADD (attention deficit disorder) 01/29/2016  . Migraines 09/18/2015  . Lumbar back pain with radiculopathy affecting right lower extremity 07/31/2015  . HSIL (high grade squamous intraepithelial lesion) on Pap smear of cervix 08/22/2013  . Bipolar I disorder ([place]) 08/22/2013  . Anxiety state, unspecified 03/02/2013  . Bulging lumbar disc 08/17/2012    ,CHRIS , PTA 08/07/2017, 10:14 AM  [facility] [place], [place], [address] Phone: [phone]   Fax:  [phone]  Name: [name] MRN: [id number] Date of Birth: 1981/01/04  PHYSICAL THERAPY DISCHARGE SUMMARY  Visits from Start of Care: 7.  Current functional level related to goals / functional outcomes: See above.   Remaining deficits: See below.   Education / Equipment: HEP. Plan: Patient agrees to discharge.  Patient goals were not met. Patient is being discharged due to not returning since the last visit.  ?????         Mali Applegate MPT

## 2017-08-10 MED FILL — ZIPRASIDONE HCL 20 MG CAP: 20 | 27 days supply | Qty: 27 | Fill #0

## 2017-08-18 DIAGNOSIS — M5126 Other intervertebral disc displacement, lumbar region: Secondary | ICD-10-CM | POA: Diagnosis not present

## 2017-08-19 ENCOUNTER — Encounter: Payer: Self-pay | Admitting: Family Medicine

## 2017-08-28 MED FILL — AMPHETAMINE SALTS 15 MG TAB: 15 | 30 days supply | Qty: 60 | Fill #0

## 2017-09-25 DIAGNOSIS — F411 Generalized anxiety disorder: Secondary | ICD-10-CM | POA: Diagnosis not present

## 2017-09-25 DIAGNOSIS — F319 Bipolar disorder, unspecified: Secondary | ICD-10-CM | POA: Diagnosis not present

## 2017-09-25 DIAGNOSIS — F902 Attention-deficit hyperactivity disorder, combined type: Secondary | ICD-10-CM | POA: Diagnosis not present

## 2017-09-25 MED FILL — AMPHETAMINE SALTS 15 MG TAB: 15 | 30 days supply | Qty: 60 | Fill #0

## 2017-09-25 MED FILL — VENLAFAXINE HCL ER 37.5 MG: 37.5 | 30 days supply | Qty: 30 | Fill #0

## 2017-09-25 MED FILL — ARIPiprazole 2 MG TABS: 2 | 30 days supply | Qty: 30 | Fill #0

## 2017-10-02 DIAGNOSIS — M5126 Other intervertebral disc displacement, lumbar region: Secondary | ICD-10-CM | POA: Diagnosis not present

## 2017-10-02 DIAGNOSIS — Z6825 Body mass index (BMI) 25.0-25.9, adult: Secondary | ICD-10-CM | POA: Diagnosis not present

## 2017-10-15 ENCOUNTER — Ambulatory Visit (INDEPENDENT_AMBULATORY_CARE_PROVIDER_SITE_OTHER): Payer: 59 | Admitting: Physician Assistant

## 2017-10-15 ENCOUNTER — Ambulatory Visit: Payer: 59 | Admitting: Family Medicine

## 2017-10-15 ENCOUNTER — Encounter: Payer: Self-pay | Admitting: Physician Assistant

## 2017-10-15 VITALS — BP 110/80 | HR 101 | Temp 98.1°F | Resp 14 | Wt 148.2 lb

## 2017-10-15 DIAGNOSIS — T7840XA Allergy, unspecified, initial encounter: Secondary | ICD-10-CM | POA: Diagnosis not present

## 2017-10-15 MED ORDER — EPINEPHRINE 0.3 MG/0.3ML IJ SOAJ: 0.3000 mg | Freq: Once | INTRAMUSCULAR | 1 refills | Status: AC

## 2017-10-16 NOTE — Progress Notes (Signed)
Patient ID: ARWA YERO MRN: [id number], DOB: 1981/09/13, 36 y.o. Date of Encounter: 10/16/2017, 7:27 AM    Chief Complaint:  Chief Complaint  Patient presents with  . Allergic Reaction    shellfish      HPI: 36 y.o. year old female presents with above.   She reports that last Friday she ate shrimp. One hour later her lips became red and tingly.   This past Sunday, she and her husband were at a [employer]. She let her husband eat all of the shrimp but she ate the rice that had touched the shrimp. Her lips became red and tingly. She started itching. She developed "splotches" of rash on her skin. 35 minutes later she had cough, tickle in throat. Took Benadryl. Symptoms resolved.   She wants to have allergy testing. Wants to know exactly what she has to avoid and what she can eat---she usually eats a lot of seafood-- loves shrimp and salmon--so wants to know what to avoid but what she can eat.      Home Meds:   Outpatient Medications Prior to Visit  Medication Sig Dispense Refill  . amphetamine-dextroamphetamine (ADDERALL) 15 MG tablet Take 15 mg by mouth daily.    . ARIPiprazole (ABILIFY) 2 MG tablet Take 2 mg by mouth daily.    Marland Kitchen gabapentin (NEURONTIN) 300 MG capsule Take 300 mg by mouth 3 (three) times daily.    Marland Kitchen HYDROcodone-acetaminophen (NORCO) 5-325 MG tablet Take 1 tablet by mouth every 6 (six) hours as needed for moderate pain. 60 tablet 0  . naproxen (NAPROSYN) 500 MG tablet Take 1 tablet (500 mg total) by mouth 2 (two) times daily with a meal. 60 tablet 1  . venlafaxine (EFFEXOR) 37.5 MG tablet Take 37.5 mg by mouth daily.    . ziprasidone (GEODON) 20 MG capsule Take 20 mg by mouth 2 (two) times daily with a meal.     No facility-administered medications prior to visit.     Allergies:  Allergies  Allergen Reactions  . Lamictal [Lamotrigine]     Knots in throat and back of head  . Prozac [Fluoxetine Hcl] Other (See Comments)    Blurry vision       Review of Systems: See HPI for pertinent ROS. All other ROS negative.    Physical Exam: Blood pressure 110/80, pulse (!) 101, temperature 98.1 F (36.7 C), temperature source Oral, resp. rate 14, weight 67.2 kg (148 lb 3.2 oz), last menstrual period 09/16/2017, SpO2 99 %., Body mass index is 24.66 kg/m. General: WNWD WF.  Appears in no acute distress. HEENT: Normocephalic, atraumatic. Lips appear normal. Oral mucosa, posterior pharynx, throat appear normal.  Neck: Supple. No thyromegaly. No lymphadenopathy. Lungs: Clear bilaterally to auscultation without wheezes, rales, or rhonchi. Breathing is unlabored. Heart: Regular rhythm. No murmurs, rubs, or gallops. Msk:  Strength and tone normal for age. Skin: No rash on skin. Neuro: Alert and oriented X 3. Moves all extremities spontaneously. Gait is normal. CNII-XII grossly in tact. Psych:  Responds to questions appropriately with a normal affect.     ASSESSMENT AND PLAN:  36 y.o. year old female with  1. Allergic reaction, initial encounter Epi Pen to use if needed in interim. Discussed indications of when to use this.Also discussed use of Benadryl.  Refer to Allergist for Allergy Testing and further management. Avoid all seafood and foods that have come in contact with seafood in the interim. - EPINEPHrine 0.3 mg/0.3 mL IJ SOAJ injection; Inject 0.3  mLs (0.3 mg total) into the muscle once for 1 dose.  Dispense: 1 Device; Refill: 1 - Ambulatory referral to Allergy   Signed, Karis Juba, [place], [facility] 10/16/2017 7:27 AM

## 2017-10-20 HISTORY — PX: APPENDECTOMY: SHX54

## 2017-10-21 MED FILL — VENLAFAXINE HCL ER 37.5 MG: 37.5 | 30 days supply | Qty: 30 | Fill #1

## 2017-10-23 MED FILL — DEXTROAMP-AMPHETAMIN 15 MG: 15 | 30 days supply | Qty: 60 | Fill #0

## 2017-11-03 ENCOUNTER — Telehealth: Payer: 59 | Admitting: Nurse Practitioner

## 2017-11-03 DIAGNOSIS — J40 Bronchitis, not specified as acute or chronic: Secondary | ICD-10-CM

## 2017-11-03 MED ORDER — PREDNISONE 10 MG (21) PO TBPK: ORAL_TABLET | ORAL | 0 refills | Status: DC

## 2017-11-03 MED FILL — ARIPiprazole 2 MG TABS: 2 | 30 days supply | Qty: 30 | Fill #1

## 2017-11-03 NOTE — Progress Notes (Signed)

## 2017-11-10 ENCOUNTER — Encounter: Payer: Self-pay | Admitting: Family Medicine

## 2017-11-10 MED ORDER — ALBUTEROL SULFATE HFA 108 (90 BASE) MCG/ACT IN AERS: 2.0000 | INHALATION_SPRAY | Freq: Four times a day (QID) | RESPIRATORY_TRACT | 11 refills | Status: DC | PRN

## 2017-11-10 MED ORDER — DM-GUAIFENESIN ER 30-600 MG PO TB12: 1.0000 | ORAL_TABLET | Freq: Two times a day (BID) | ORAL | 1 refills | Status: DC

## 2017-11-16 ENCOUNTER — Ambulatory Visit (INDEPENDENT_AMBULATORY_CARE_PROVIDER_SITE_OTHER): Payer: Self-pay | Admitting: Emergency Medicine

## 2017-11-16 VITALS — BP 95/60 | HR 109 | Temp 99.3°F | Resp 16 | Wt 147.6 lb

## 2017-11-16 DIAGNOSIS — J069 Acute upper respiratory infection, unspecified: Secondary | ICD-10-CM

## 2017-11-16 DIAGNOSIS — R6889 Other general symptoms and signs: Secondary | ICD-10-CM

## 2017-11-16 MED ORDER — MAGIC MOUTHWASH W/LIDOCAINE: 5.0000 mL | Freq: Three times a day (TID) | ORAL | 0 refills | Status: DC | PRN

## 2017-11-16 MED FILL — VENLAFAXINE HCL ER 37.5 MG: 37.5 | 30 days supply | Qty: 30 | Fill #2

## 2017-11-16 NOTE — Patient Instructions (Signed)

## 2017-11-16 NOTE — Progress Notes (Signed)
  [name] is a 37 y.o. female presenting with a sore throat for 2 days. Along with fever, mylagias, fatigue, and loss of appetite. She reports she received the flu vaccine this year. 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  Associated symptoms include:  fever, chills, headache and muscle aches.  Symptoms are constant.  Home treatment thus far includes:  rest, hydration and NSAIDS/acetaminophen.  Known sick contacts with similar symptoms.    Exam:  BP 95/60 (BP Location: Right Arm, Patient Position: Sitting, Cuff Size: Normal)   Pulse (!) 109   Temp 99.3 F (37.4 C) (Oral)   Resp 16   Wt 147 lb 9.6 oz (67 kg)   SpO2 96%   BMI 24.56 kg/m   Physical Exam  Constitutional: She is well-developed, well-nourished, and in no distress. No distress.  HENT:  Head: Normocephalic and atraumatic.  Right Ear: Tympanic membrane and external ear normal.  Left Ear: Tympanic membrane and external ear normal.  Nose: Nose normal. Right sinus exhibits no maxillary sinus tenderness and no frontal sinus tenderness. Left sinus exhibits no maxillary sinus tenderness and no frontal sinus tenderness.  Mouth/Throat: Uvula is midline and oropharynx is clear and moist. No oropharyngeal exudate.  Tonsils +1  Eyes: Conjunctivae are normal.  Cardiovascular: Regular rhythm.  Pulmonary/Chest: Effort normal and breath sounds normal.  Lymphadenopathy:       Head (right side): Tonsillar adenopathy present.       Head (left side): Tonsillar adenopathy present.    She has cervical adenopathy.  Skin: She is not diaphoretic.  Nursing note and vitals reviewed.  A:  1. Flu-like symptoms   2. Viral URI    P:  Flu test negative, most likely viral URI. Recommend rest, fluids, magic mouthwash for sore throat. Work note provided. Follow up at the ER is symptoms worsen or return to clinic if symptoms persist past 1 week

## 2017-11-17 ENCOUNTER — Telehealth: Payer: Self-pay

## 2017-11-17 ENCOUNTER — Other Ambulatory Visit: Payer: Self-pay | Admitting: Nurse Practitioner

## 2017-11-17 MED ORDER — MAGIC MOUTHWASH W/LIDOCAINE: 5.0000 mL | Freq: Three times a day (TID) | ORAL | 0 refills | Status: AC

## 2017-11-17 MED ORDER — MAGIC MOUTHWASH W/LIDOCAINE: 5.0000 mL | Freq: Three times a day (TID) | ORAL | 0 refills | Status: DC | PRN

## 2017-11-17 NOTE — Telephone Encounter (Signed)
Tried calling pharmacy and kept losing a connection. Called patient to inform her  that it seems that [facility] has sent over another rx for her.   Patient states she will check with the pharmacy and cancel her appointment for now and if there is a problem with her medication or if she is still sick then she will call back and reschedule.

## 2017-11-17 NOTE — Addendum Note (Signed)
Addended by: Clyde Lundborg A on: 11/17/2017 09:20 AM   Modules accepted: Orders

## 2017-11-17 NOTE — Telephone Encounter (Signed)
Patient called complaining of sore throat. She was seen at  [facility] on 11/16/2017 and they prescribed magic mouthwash with nystatin.   Patient states pharmacy told her magic mouthwash with nystain is on national backorder, and she states [facility] is [facility] minutes away and she doesn't want to have to go that far to  get another rx . Patient was wondering if you could prescribe something for her.  Patient states they checked her for the flu and it was negative

## 2017-11-17 NOTE — Progress Notes (Signed)
Patient called this am because she could not pick up the [place] prescribed by Barnet Glasgow, FNP on 1/28.  Patient continues to c/o severe throat pain.  Patient also informs that [facility] told her nystatin was on back order, requesting medication without nystatin.  [facility]	[place], spoke with Cyril Mourning and gave telephone order for: Magic Mouthwash, 1 part Benadryl, 1 part lidocaine, 1 part maalox. Take 93mls three times daily for mouth pain, swish and swallow.  159ml, 0 refills.

## 2017-11-17 NOTE — Telephone Encounter (Signed)
Call pharmacy and see if they have a formulary for magic mouthwash It does not need to have nystatin in it. Unless she has thrush, then she can just use Nystatin suspension FOur times a day for 1 week

## 2017-11-18 ENCOUNTER — Ambulatory Visit: Payer: Self-pay | Admitting: Family Medicine

## 2017-11-20 ENCOUNTER — Encounter: Payer: Self-pay | Admitting: Family Medicine

## 2017-11-20 ENCOUNTER — Other Ambulatory Visit: Payer: Self-pay | Admitting: Family Medicine

## 2017-11-20 DIAGNOSIS — R7989 Other specified abnormal findings of blood chemistry: Secondary | ICD-10-CM

## 2017-11-20 DIAGNOSIS — R945 Abnormal results of liver function studies: Principal | ICD-10-CM

## 2017-11-20 DIAGNOSIS — T781XXA Other adverse food reactions, not elsewhere classified, initial encounter: Secondary | ICD-10-CM | POA: Diagnosis not present

## 2017-11-20 MED FILL — DEXTROAMP-AMPHETAMIN 15 MG: 15 | 30 days supply | Qty: 60 | Fill #0

## 2017-11-21 LAB — COMPLETE METABOLIC PANEL WITH GFR
AG RATIO: 1.4 (calc) (ref 1.0–2.5)
ALBUMIN MSPROF: 4 g/dL (ref 3.6–5.1)
ALT: 37 U/L — ABNORMAL HIGH (ref 6–29)
AST: 27 U/L (ref 10–30)
Alkaline phosphatase (APISO): 102 U/L (ref 33–115)
BILIRUBIN TOTAL: 0.5 mg/dL (ref 0.2–1.2)
BUN: 12 mg/dL (ref 7–25)
CALCIUM: 9.2 mg/dL (ref 8.6–10.2)
CO2: 27 mmol/L (ref 20–32)
Chloride: 104 mmol/L (ref 98–110)
Creat: 0.7 mg/dL (ref 0.50–1.10)
GFR, EST AFRICAN AMERICAN: 130 mL/min/{1.73_m2} (ref >=60)
GFR, EST NON AFRICAN AMERICAN: 112 mL/min/{1.73_m2} (ref >=60)
Globulin: 2.8 g/dL (calc) (ref 1.9–3.7)
Glucose, Bld: 81 mg/dL (ref 65–139)
POTASSIUM: 3.8 mmol/L (ref 3.5–5.3)
Sodium: 139 mmol/L (ref 135–146)
TOTAL PROTEIN: 6.8 g/dL (ref 6.1–8.1)

## 2017-12-18 MED FILL — DEXTROAMP-AMPHETAMIN 15 MG: 15 | 30 days supply | Qty: 60 | Fill #0

## 2017-12-25 DIAGNOSIS — F902 Attention-deficit hyperactivity disorder, combined type: Secondary | ICD-10-CM | POA: Diagnosis not present

## 2017-12-25 DIAGNOSIS — F319 Bipolar disorder, unspecified: Secondary | ICD-10-CM | POA: Diagnosis not present

## 2017-12-25 DIAGNOSIS — F411 Generalized anxiety disorder: Secondary | ICD-10-CM | POA: Diagnosis not present

## 2017-12-25 MED FILL — VENLAFAXINE HCL ER 37.5 MG: 37.5 | 30 days supply | Qty: 30 | Fill #0

## 2017-12-25 MED FILL — ARIPiprazole 2 MG TABS: 2 | 30 days supply | Qty: 30 | Fill #0

## 2018-01-13 DIAGNOSIS — Z6825 Body mass index (BMI) 25.0-25.9, adult: Secondary | ICD-10-CM | POA: Diagnosis not present

## 2018-01-13 DIAGNOSIS — M5126 Other intervertebral disc displacement, lumbar region: Secondary | ICD-10-CM | POA: Diagnosis not present

## 2018-01-15 MED FILL — DEXTROAMP-AMPHETAMIN 15 MG: 15 | 30 days supply | Qty: 60 | Fill #0

## 2018-01-27 DIAGNOSIS — M5416 Radiculopathy, lumbar region: Secondary | ICD-10-CM | POA: Diagnosis not present

## 2018-01-27 DIAGNOSIS — Z6825 Body mass index (BMI) 25.0-25.9, adult: Secondary | ICD-10-CM | POA: Diagnosis not present

## 2018-01-27 DIAGNOSIS — M5126 Other intervertebral disc displacement, lumbar region: Secondary | ICD-10-CM | POA: Diagnosis not present

## 2018-01-27 DIAGNOSIS — R030 Elevated blood-pressure reading, without diagnosis of hypertension: Secondary | ICD-10-CM | POA: Diagnosis not present

## 2018-02-03 MED FILL — VENLAFAXINE HCL ER 37.5 MG: 37.5 | 30 days supply | Qty: 30 | Fill #1

## 2018-02-16 MED FILL — DEXTROAMP-AMPHETAMIN 15 MG: 15 | 30 days supply | Qty: 60 | Fill #0

## 2018-02-19 ENCOUNTER — Ambulatory Visit (INDEPENDENT_AMBULATORY_CARE_PROVIDER_SITE_OTHER): Payer: 59 | Admitting: Family Medicine

## 2018-02-19 ENCOUNTER — Encounter: Payer: Self-pay | Admitting: Family Medicine

## 2018-02-19 ENCOUNTER — Other Ambulatory Visit: Payer: Self-pay

## 2018-02-19 VITALS — BP 112/68 | HR 98 | Temp 98.0°F | Resp 12 | Ht 65.0 in | Wt 145.0 lb

## 2018-02-19 DIAGNOSIS — Z23 Encounter for immunization: Secondary | ICD-10-CM | POA: Diagnosis not present

## 2018-02-19 DIAGNOSIS — R87610 Atypical squamous cells of undetermined significance on cytologic smear of cervix (ASC-US): Secondary | ICD-10-CM | POA: Diagnosis not present

## 2018-02-19 DIAGNOSIS — R350 Frequency of micturition: Secondary | ICD-10-CM

## 2018-02-19 DIAGNOSIS — Z124 Encounter for screening for malignant neoplasm of cervix: Secondary | ICD-10-CM

## 2018-02-19 DIAGNOSIS — B9689 Other specified bacterial agents as the cause of diseases classified elsewhere: Secondary | ICD-10-CM

## 2018-02-19 DIAGNOSIS — N760 Acute vaginitis: Secondary | ICD-10-CM | POA: Diagnosis not present

## 2018-02-19 DIAGNOSIS — Z0000 Encounter for general adult medical examination without abnormal findings: Secondary | ICD-10-CM | POA: Diagnosis not present

## 2018-02-19 DIAGNOSIS — N898 Other specified noninflammatory disorders of vagina: Secondary | ICD-10-CM | POA: Diagnosis not present

## 2018-02-19 LAB — URINALYSIS, ROUTINE W REFLEX MICROSCOPIC
Bilirubin Urine: NEGATIVE
GLUCOSE, UA: NEGATIVE
Hgb urine dipstick: NEGATIVE
Ketones, ur: NEGATIVE
LEUKOCYTES UA: NEGATIVE
Nitrite: NEGATIVE
PH: 6 (ref 5.0–8.0)
Protein, ur: NEGATIVE
SPECIFIC GRAVITY, URINE: 1.025 (ref 1.001–1.03)

## 2018-02-19 LAB — COMPREHENSIVE METABOLIC PANEL
AG Ratio: 1.5 (calc) (ref 1.0–2.5)
ALT: 27 U/L (ref 6–29)
AST: 24 U/L (ref 10–30)
Albumin: 4.2 g/dL (ref 3.6–5.1)
Alkaline phosphatase (APISO): 89 U/L (ref 33–115)
BUN: 11 mg/dL (ref 7–25)
CHLORIDE: 103 mmol/L (ref 98–110)
CO2: 28 mmol/L (ref 20–32)
CREATININE: 0.74 mg/dL (ref 0.50–1.10)
Calcium: 9.2 mg/dL (ref 8.6–10.2)
GLOBULIN: 2.8 g/dL (ref 1.9–3.7)
GLUCOSE: 83 mg/dL (ref 65–99)
Potassium: 4.5 mmol/L (ref 3.5–5.3)
Sodium: 137 mmol/L (ref 135–146)
TOTAL PROTEIN: 7 g/dL (ref 6.1–8.1)
Total Bilirubin: 0.4 mg/dL (ref 0.2–1.2)

## 2018-02-19 LAB — LIPID PANEL
CHOL/HDL RATIO: 3.1 (calc) (ref <5.0)
Cholesterol: 147 mg/dL (ref <200)
HDL: 47 mg/dL — AB (ref >50)
LDL CHOLESTEROL (CALC): 77 mg/dL
NON-HDL CHOLESTEROL (CALC): 100 mg/dL (ref <130)
Triglycerides: 134 mg/dL (ref <150)

## 2018-02-19 LAB — WET PREP FOR TRICH, YEAST, CLUE

## 2018-02-19 MED ORDER — FLUCONAZOLE 150 MG PO TABS: ORAL_TABLET | ORAL | 0 refills | Status: DC

## 2018-02-19 MED ORDER — METRONIDAZOLE 500 MG PO TABS: 500.0000 mg | ORAL_TABLET | Freq: Two times a day (BID) | ORAL | 0 refills | Status: DC

## 2018-02-19 NOTE — Patient Instructions (Signed)
I recommend eye visit once a year I recommend dental visit every 6 months Goal is to  Exercise 30 minutes 5 days a week We will call with lab results  TDAP given  F/U 1 year for Physical

## 2018-02-19 NOTE — Progress Notes (Signed)
Subjective:    Patient ID: [name], female    DOB: [birth year], 37 y.o.   MRN: [id number]  Patient presents for CPE with PAP (is fasting)  Pt here for CPE  Due for PAP Smear Due for TDAP  Due for fasting labs    Seen at labuer allergy- Had allergy testing neative   Had epidural injection- Dr. Brien Few for L5-S1/ still gets paresthesia and cramps in legs in calf areas    No improvement, awaiting f/u   Taking gabapentin, Norco    Dr. Janus Molder - psychiatry on Adderall and abilfy   PAP Smear - LMP week, mild discharge    Feels like she has to urinate all the times, for past few months, no dysuria, some vaginal discharge with odor    TDAP due       Review Of Systems:  GEN- denies fatigue, fever, weight loss,weakness, recent illness HEENT- denies eye drainage, change in vision, nasal discharge, CVS- denies chest pain, palpitations RESP- denies SOB, cough, wheeze ABD- denies N/V, change in stools, abd pain GU- denies dysuria, hematuria, dribbling, incontinence MSK- denies joint pain, muscle aches, injury Neuro- denies headache, dizziness, syncope, seizure activity       Objective:    BP 112/68   Pulse 98   Temp 98 F (36.7 C) (Oral)   Resp 12   Ht 5\' 5"  (1.651 m)   Wt 145 lb (65.8 kg)   LMP 02/14/2018 Comment: regular  SpO2 100%   BMI 24.13 kg/m  GEN- NAD, alert and oriented x3 HEENT- PERRL, EOMI, non injected sclera, pink conjunctiva, MMM, oropharynx clear Neck- Supple, no thyromegaly Breast- normal symmetry, no nipple inversion,no nipple drainage, no nodules or lumps felt Nodes- no axillary nodes CVS- RRR, no murmur RESP-CTAB ABD-NABS,soft,NT,ND, no CVA tenderness GU- normal external genitalia, vaginal mucosa pink and moist, cervix visualized no growth, no blood form os, minimal thin clear discharge, no CMT, no ovarian masses, uterus normal size EXT- No edema Pulses- Radial, DP- 2+        Assessment & Plan:      Problem List Items Addressed This  Visit    None    Visit Diagnoses    Routine general medical examination at a health care facility    -  Primary   CPE done. TDAP given, fasting labs, HIV screening    Relevant Orders   CBC with Differential/Platelet (Completed)   Comprehensive metabolic panel (Completed)   Lipid panel (Completed)   HIV antibody (Completed)   Cervical cancer screening       Relevant Orders   Pap IG w/ reflex to HPV when ASC-U   BV (bacterial vaginosis)       Treat with Flagyl, given diflucan due to yeast after antibiotics. See how bladder responds may have OAB, will let me know how symptoms are   Relevant Medications   metroNIDAZOLE (FLAGYL) 500 MG tablet   fluconazole (DIFLUCAN) 150 MG tablet   Other Relevant Orders   WET PREP FOR TRICH, YEAST, CLUE (Completed)   Urinary frequency       Relevant Orders   Urinalysis, Routine w reflex microscopic (Completed)   Need for tetanus, diphtheria, and acellular pertussis (Tdap) vaccine in patient of adolescent age or older       Relevant Orders   Tdap vaccine greater than or equal to 7yo IM (Completed)      Note: This dictation was prepared with Dragon dictation along with smaller phrase technology. Any transcriptional errors that  result from this process are unintentional.

## 2018-02-20 LAB — CBC WITH DIFFERENTIAL/PLATELET
Basophils Absolute: 21 cells/uL (ref 0–200)
Basophils Relative: 0.5 %
Eosinophils Absolute: 139 cells/uL (ref 15–500)
Eosinophils Relative: 3.4 %
HCT: 42.8 % (ref 35.0–45.0)
Hemoglobin: 14.3 g/dL (ref 11.7–15.5)
Lymphs Abs: 1509 cells/uL (ref 850–3900)
MCH: 27.1 pg (ref 27.0–33.0)
MCHC: 33.4 g/dL (ref 32.0–36.0)
MCV: 81.1 fL (ref 80.0–100.0)
MPV: 11.1 fL (ref 7.5–12.5)
Monocytes Relative: 8 %
Neutro Abs: 2103 cells/uL (ref 1500–7800)
Neutrophils Relative %: 51.3 %
Platelets: 203 10*3/uL (ref 140–400)
RBC: 5.28 10*6/uL — ABNORMAL HIGH (ref 3.80–5.10)
RDW: 13.1 % (ref 11.0–15.0)
Total Lymphocyte: 36.8 %
WBC mixed population: 328 cells/uL (ref 200–950)
WBC: 4.1 10*3/uL (ref 3.8–10.8)

## 2018-02-20 LAB — HIV ANTIBODY (ROUTINE TESTING W REFLEX): HIV 1&2 Ab, 4th Generation: NONREACTIVE

## 2018-02-21 ENCOUNTER — Encounter: Payer: Self-pay | Admitting: Family Medicine

## 2018-02-23 LAB — PAP IG W/ RFLX HPV ASCU

## 2018-02-23 LAB — HUMAN PAPILLOMAVIRUS, HIGH RISK: HPV DNA High Risk: NOT DETECTED

## 2018-03-08 MED FILL — VENLAFAXINE HCL ER 37.5 MG: 37.5 | 30 days supply | Qty: 30 | Fill #2

## 2018-03-12 DIAGNOSIS — S93401A Sprain of unspecified ligament of right ankle, initial encounter: Secondary | ICD-10-CM | POA: Diagnosis not present

## 2018-03-12 DIAGNOSIS — M25571 Pain in right ankle and joints of right foot: Secondary | ICD-10-CM | POA: Diagnosis not present

## 2018-03-12 DIAGNOSIS — S99911A Unspecified injury of right ankle, initial encounter: Secondary | ICD-10-CM | POA: Diagnosis not present

## 2018-03-12 DIAGNOSIS — M7989 Other specified soft tissue disorders: Secondary | ICD-10-CM | POA: Diagnosis not present

## 2018-03-17 DIAGNOSIS — M5416 Radiculopathy, lumbar region: Secondary | ICD-10-CM | POA: Diagnosis not present

## 2018-03-17 DIAGNOSIS — M5126 Other intervertebral disc displacement, lumbar region: Secondary | ICD-10-CM | POA: Diagnosis not present

## 2018-03-20 ENCOUNTER — Telehealth: Payer: 59 | Admitting: Family

## 2018-03-20 DIAGNOSIS — L209 Atopic dermatitis, unspecified: Secondary | ICD-10-CM | POA: Diagnosis not present

## 2018-03-20 MED ORDER — TRIAMCINOLONE ACETONIDE 0.025 % EX OINT: 1.0000 "application " | TOPICAL_OINTMENT | Freq: Two times a day (BID) | CUTANEOUS | 0 refills | Status: DC

## 2018-03-20 NOTE — Progress Notes (Signed)
E Visit for Rash  We are sorry that you are not feeling well. Here is how we plan to help!  Based on what you shared with me it looks like you have atopic dermatitis.  Atropic dermatitis is a skin rash caused by something that touches the skin and causes irritation or inflammation.  Your skin may be red, swollen, dry, cracked, and itch.  The rash should go away in a few days but can last a few weeks.  If you get a rash, it's important to figure out what caused it so the irritant can be avoided in the future. I have sent in Kenalog cream you can use twice a day. Do not apply to your face.     HOME CARE:   Take cool showers and avoid direct sunlight.  Apply cool compress or wet dressings.  Take a bath in an oatmeal bath.  Sprinkle content of one Aveeno packet under running faucet with comfortably warm water.  Bathe for 15-20 minutes, 1-2 times daily.  Pat dry with a towel. Do not rub the rash.  Use hydrocortisone cream.  Take an antihistamine like Benadryl for widespread rashes that itch.  The adult dose of Benadryl is 25-50 mg by mouth 4 times daily.  Caution:  This type of medication may cause sleepiness.  Do not drink alcohol, drive, or operate dangerous machinery while taking antihistamines.  Do not take these medications if you have prostate enlargement.  Read package instructions thoroughly on all medications that you take.  GET HELP RIGHT AWAY IF:   Symptoms don't go away after treatment.  Severe itching that persists.  If you rash spreads or swells.  If you rash begins to smell.  If it blisters and opens or develops a yellow-brown crust.  You develop a fever.  You have a sore throat.  You become short of breath.  MAKE SURE YOU:  Understand these instructions. Will watch your condition. Will get help right away if you are not doing well or get worse.  Thank you for choosing an e-visit. Your e-visit answers were reviewed by a board certified advanced clinical  practitioner to complete your personal care plan. Depending upon the condition, your plan could have included both over the counter or prescription medications. Please review your pharmacy choice. Be sure that the pharmacy you have chosen is open so that you can pick up your prescription now.  If there is a problem you may message your provider in [place] to have the prescription routed to another pharmacy. Your safety is important to [place]. If you have drug allergies check your prescription carefully.  For the next 24 hours, you can use MyChart to ask questions about today's visit, request a non-urgent call back, or ask for a work or school excuse from your e-visit provider. You will get an email in the next two days asking about your experience. I hope that your e-visit has been valuable and will speed your recovery.

## 2018-03-26 DIAGNOSIS — F902 Attention-deficit hyperactivity disorder, combined type: Secondary | ICD-10-CM | POA: Diagnosis not present

## 2018-03-26 DIAGNOSIS — F319 Bipolar disorder, unspecified: Secondary | ICD-10-CM | POA: Diagnosis not present

## 2018-03-26 DIAGNOSIS — F411 Generalized anxiety disorder: Secondary | ICD-10-CM | POA: Diagnosis not present

## 2018-04-08 ENCOUNTER — Ambulatory Visit (INDEPENDENT_AMBULATORY_CARE_PROVIDER_SITE_OTHER): Payer: 59 | Admitting: Family Medicine

## 2018-04-08 ENCOUNTER — Other Ambulatory Visit: Payer: Self-pay

## 2018-04-08 ENCOUNTER — Encounter: Payer: Self-pay | Admitting: Family Medicine

## 2018-04-08 VITALS — BP 124/78 | HR 101 | Temp 98.5°F | Ht 65.0 in | Wt 151.0 lb

## 2018-04-08 DIAGNOSIS — S39012A Strain of muscle, fascia and tendon of lower back, initial encounter: Secondary | ICD-10-CM

## 2018-04-08 MED ORDER — PREDNISONE 20 MG PO TABS: ORAL_TABLET | ORAL | 0 refills | Status: DC

## 2018-04-08 MED ORDER — CYCLOBENZAPRINE HCL 10 MG PO TABS: 10.0000 mg | ORAL_TABLET | Freq: Three times a day (TID) | ORAL | 0 refills | Status: DC | PRN

## 2018-04-08 MED ORDER — HYDROCODONE-ACETAMINOPHEN 5-325 MG PO TABS
1.0000 | ORAL_TABLET | Freq: Three times a day (TID) | ORAL | 0 refills | Status: DC | PRN
Start: 2018-04-08 — End: 2019-08-10
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
## 2018-04-29 ENCOUNTER — Encounter: Payer: Self-pay | Admitting: Family Medicine

## 2018-04-30 ENCOUNTER — Encounter (HOSPITAL_COMMUNITY): Payer: Self-pay | Admitting: Emergency Medicine

## 2018-04-30 ENCOUNTER — Other Ambulatory Visit: Payer: Self-pay

## 2018-04-30 ENCOUNTER — Emergency Department (HOSPITAL_COMMUNITY): Payer: 59

## 2018-04-30 ENCOUNTER — Emergency Department (HOSPITAL_COMMUNITY)
Admission: EM | Admit: 2018-04-30 | Discharge: 2018-04-30 | Disposition: A | Discharge status: Home / Self Care | Payer: 59 | Attending: Emergency Medicine | Admitting: Emergency Medicine

## 2018-04-30 DIAGNOSIS — Z79899 Other long term (current) drug therapy: Secondary | ICD-10-CM | POA: Insufficient documentation

## 2018-04-30 DIAGNOSIS — R1031 Right lower quadrant pain: Secondary | ICD-10-CM | POA: Diagnosis not present

## 2018-04-30 DIAGNOSIS — F17210 Nicotine dependence, cigarettes, uncomplicated: Secondary | ICD-10-CM | POA: Insufficient documentation

## 2018-04-30 DIAGNOSIS — N938 Other specified abnormal uterine and vaginal bleeding: Secondary | ICD-10-CM | POA: Insufficient documentation

## 2018-04-30 DIAGNOSIS — R102 Pelvic and perineal pain: Secondary | ICD-10-CM | POA: Diagnosis not present

## 2018-04-30 DIAGNOSIS — N939 Abnormal uterine and vaginal bleeding, unspecified: Secondary | ICD-10-CM

## 2018-04-30 DIAGNOSIS — M549 Dorsalgia, unspecified: Secondary | ICD-10-CM | POA: Diagnosis not present

## 2018-04-30 DIAGNOSIS — R42 Dizziness and giddiness: Secondary | ICD-10-CM | POA: Diagnosis not present

## 2018-04-30 LAB — CBC WITH DIFFERENTIAL/PLATELET
Basophils Absolute: 0 10*3/uL (ref 0.0–0.1)
Basophils Relative: 0 %
Eosinophils Absolute: 0.1 10*3/uL (ref 0.0–0.7)
Eosinophils Relative: 2 %
HEMATOCRIT: 42.4 % (ref 36.0–46.0)
Hemoglobin: 14.2 g/dL (ref 12.0–15.0)
LYMPHS ABS: 1.5 10*3/uL (ref 0.7–4.0)
Lymphocytes Relative: 18 %
MCH: 27.4 pg (ref 26.0–34.0)
MCHC: 33.5 g/dL (ref 30.0–36.0)
MCV: 81.9 fL (ref 78.0–100.0)
MONOS PCT: 7 %
Monocytes Absolute: 0.6 10*3/uL (ref 0.1–1.0)
NEUTROS ABS: 6 10*3/uL (ref 1.7–7.7)
NEUTROS PCT: 73 %
Platelets: 187 10*3/uL (ref 150–400)
RBC: 5.18 MIL/uL — AB (ref 3.87–5.11)
RDW: 13.6 % (ref 11.5–15.5)
WBC: 8.2 10*3/uL (ref 4.0–10.5)

## 2018-04-30 LAB — WET PREP, GENITAL
CLUE CELLS WET PREP: NONE SEEN
SPERM: NONE SEEN
Trich, Wet Prep: NONE SEEN
YEAST WET PREP: NONE SEEN

## 2018-04-30 LAB — BASIC METABOLIC PANEL
Anion gap: 7 (ref 5–15)
BUN: 12 mg/dL (ref 6–20)
CHLORIDE: 101 mmol/L (ref 98–111)
CO2: 27 mmol/L (ref 22–32)
CREATININE: 0.65 mg/dL (ref 0.44–1.00)
Calcium: 9.1 mg/dL (ref 8.9–10.3)
GFR calc non Af Amer: >60 mL/min (ref >60)
Glucose, Bld: 87 mg/dL (ref 70–99)
POTASSIUM: 3.8 mmol/L (ref 3.5–5.1)
Sodium: 135 mmol/L (ref 135–145)

## 2018-04-30 LAB — PREGNANCY, URINE: Preg Test, Ur: NEGATIVE

## 2018-04-30 MED ORDER — SODIUM CHLORIDE 0.9 % IV BOLUS
1000.0000 mL | Freq: Once | INTRAVENOUS | Status: AC
Start: 2018-04-30 — End: 2018-04-30
  Administered 2018-04-30: 1000 mL via INTRAVENOUS

## 2018-04-30 MED ORDER — FENTANYL CITRATE (PF) 100 MCG/2ML IJ SOLN
50.0000 ug | Freq: Once | INTRAMUSCULAR | Status: AC
  Administered 2018-04-30: 50 ug via INTRAVENOUS
  Filled 2018-04-30: qty 2

## 2018-04-30 MED ORDER — ONDANSETRON HCL 4 MG/2ML IJ SOLN
4.0000 mg | Freq: Once | INTRAMUSCULAR | Status: AC
  Administered 2018-04-30: 4 mg via INTRAVENOUS
  Filled 2018-04-30: qty 2

## 2018-04-30 NOTE — Discharge Instructions (Addendum)
Take ibuprofen and Tylenol for your pain.  If your abdominal pain worsens or does not improve in the next 24 hours, return to the ER for evaluation.  Otherwise follow-up closely with your OB/GYN.

## 2018-04-30 NOTE — ED Provider Notes (Signed)
[facility] EMERGENCY DEPARTMENT Provider Note   CSN: [id number] Arrival date & time: 04/30/18  1330     History   Chief Complaint Chief Complaint  Patient presents with  . Pelvic Pain  . Vaginal Bleeding    HPI [name] is a 37 y.o. female.  HPI  37 year old female presents with right lower quadrant pain and vaginal bleeding.  The vaginal bleeding started 2 days ago.  It is heavy with clots.  She states her LMP ended about a week ago and was normal.  She is very regular.  The right lower quadrant abdominal pain started this morning when she woke up.  It has progressively worsened.  It feels like a pressure in that area.  It does not radiate.  She has chronic back pain but no new back pain.  No vomiting but has had some nausea.  No discharge or concern for STI.  No urinary symptoms.  She took 2 ibuprofen about an hour ago with no significant relief.  Pain is about an 8/10.  Has had some on and off dizziness since the vaginal bleeding started.  She states that if she were to bend down, the pain in her right lower quadrant seems to be worst.  Past Medical History:  Diagnosis Date  . 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 Bipolar 1 disorder, mixed ([place])   . Bipolar disorder, mixed ([place])   . Depression   . Hair pulling   . HSIL on Pap smear of cervix 2013   [place]    Patient Active Problem List   Diagnosis Date Noted  . Palpitations 06/30/2016  . ADD (attention deficit disorder) 01/29/2016  . Migraines 09/18/2015  . Lumbar back pain with radiculopathy affecting right lower extremity 07/31/2015  . HSIL (high grade squamous intraepithelial lesion) on Pap smear of cervix 08/22/2013  . Bipolar I disorder ([place]) 08/22/2013  . Anxiety state, unspecified 03/02/2013  . Bulging lumbar disc 08/17/2012    Past Surgical History:  Procedure Laterality Date  . CHOLECYSTECTOMY    . TUBAL LIGATION       OB History   None      Home  Medications    Prior to Admission medications   Medication Sig Start Date End Date Taking? Authorizing Provider  amphetamine-dextroamphetamine (ADDERALL) 15 MG tablet Take 15 mg by mouth daily.   Yes [provider]  busPIRone (BUSPAR) 10 MG tablet Take 1 tablet by mouth daily. 03/26/18  Yes [provider]  gabapentin (NEURONTIN) 300 MG capsule Take 300 mg by mouth at bedtime as needed (Leg pain).    Yes [provider]  HYDROcodone-acetaminophen (NORCO) 5-325 MG tablet Take 1 tablet by mouth 3 (three) times daily as needed for severe pain. Patient taking differently: Take 1 tablet by mouth at bedtime.  04/08/18  Yes Delsa Grana, PA-C  ibuprofen (ADVIL,MOTRIN) 200 MG tablet Take 400 mg by mouth daily as needed for moderate pain.   Yes [provider]  triamcinolone (KENALOG) 0.025 % ointment Apply 1 application topically 2 (two) times daily. 03/20/18  Yes Hawks, Christy A, FNP  venlafaxine (EFFEXOR) 37.5 MG tablet Take 37.5 mg by mouth daily.   Yes [provider]  cyclobenzaprine (FLEXERIL) 10 MG tablet Take 1 tablet (10 mg total) by mouth 3 (three) times daily as needed for muscle spasms. Patient not taking: Reported on 04/30/2018 04/08/18   Delsa Grana, PA-C  metroNIDAZOLE (FLAGYL)  500 MG tablet Take 1 tablet (500 mg total) by mouth 2 (two) times daily. Patient not taking: Reported on 04/30/2018 02/19/18   Alycia Rossetti, MD  predniSONE (DELTASONE) 20 MG tablet Take 2 tablets (40 mg) PO daily x 5 days, then take 1 tab PO daily x 5 days Patient not taking: Reported on 04/30/2018 04/08/18   Delsa Grana, PA-C    Family History Family History  Problem Relation Age of Onset  . Cancer Mother        lung  . Heart disease Mother   . Alcohol abuse Mother   . Bipolar disorder Mother   . Cancer Father        prostate     Social History Social History   Tobacco Use  . Smoking status: Current Every Day Smoker    Packs/day: 0.50    Types: Cigarettes    . Smokeless tobacco: Never Used  Substance Use Topics  . Alcohol use: Yes    Alcohol/week: 0.0 oz    Comment: socially only less than monthly  . Drug use: No     Allergies   Lamictal [lamotrigine] and Prozac [fluoxetine hcl]   Review of Systems Review of Systems  Respiratory: Negative for shortness of breath.   Cardiovascular: Negative for chest pain.  Gastrointestinal: Positive for abdominal pain and nausea. Negative for vomiting.  Genitourinary: Positive for menstrual problem and vaginal bleeding. Negative for dysuria and vaginal discharge.  Musculoskeletal: Positive for back pain (chronic).  Neurological: Positive for light-headedness.  All other systems reviewed and are negative.    Physical Exam Updated Vital Signs BP 116/77 (BP Location: Left Arm)   Pulse 88   Temp 99.4 F (37.4 C) (Oral)   Resp 16   Ht 5\' 4"  (1.626 m)   Wt 68 kg (150 lb)   LMP 04/22/2018   SpO2 100%   BMI 25.75 kg/m   Physical Exam  Constitutional: She is oriented to person, place, and time. She appears well-developed and well-nourished. No distress.  HENT:  Head: Normocephalic and atraumatic.  Right Ear: External ear normal.  Left Ear: External ear normal.  Nose: Nose normal.  Eyes: Right eye exhibits no discharge. Left eye exhibits no discharge.  Cardiovascular: Regular rhythm and normal heart sounds. Tachycardia present.  HR~100  Pulmonary/Chest: Effort normal and breath sounds normal.  Abdominal: Soft. There is tenderness (most focal RLQ. mild tenderness suprapubic/LLQ) in the right lower quadrant.  Genitourinary: Cervix exhibits no motion tenderness. Right adnexum displays no mass. Left adnexum displays no mass. There is bleeding in the vagina.  Genitourinary Comments: Mild-moderate bleeding from cervix. Moderate tenderness in R pelvis.  Neurological: She is alert and oriented to person, place, and time.  Skin: Skin is warm and dry. She is not diaphoretic.  Nursing note and vitals  reviewed.    ED Treatments / Results  Labs (all labs ordered are listed, but only abnormal results are displayed) Labs Reviewed  WET PREP, GENITAL - Abnormal; Notable for the following components:      Result Value   WBC, Wet Prep HPF POC FEW (*)    All other components within normal limits  CBC WITH DIFFERENTIAL/PLATELET - Abnormal; Notable for the following components:   RBC 5.18 (*)    All other components within normal limits  BASIC METABOLIC PANEL  PREGNANCY, URINE  GC/CHLAMYDIA PROBE AMP ([place]) NOT AT [facility]    EKG None  Radiology US Transvaginal Non-ob  Result Date: 04/30/2018 CLINICAL DATA:  Right  lower quadrant to right pelvic pain. Evaluate for possible ovarian torsion. EXAM: TRANSABDOMINAL AND TRANSVAGINAL ULTRASOUND OF PELVIS DOPPLER ULTRASOUND OF OVARIES TECHNIQUE: Both transabdominal and transvaginal ultrasound examinations of the pelvis were performed. Transabdominal technique was performed for global imaging of the pelvis including uterus, ovaries, adnexal regions, and pelvic cul-de-sac. It was necessary to proceed with endovaginal exam following the transabdominal exam to visualize the visualize the uterus, endometrium and ovaries to better advantage. Color and duplex Doppler ultrasound was utilized to evaluate blood flow to the ovaries. COMPARISON:  CT, 05/24/2015 FINDINGS: Uterus Measurements: 7.4 x 3.6 x 6.5 cm. No fibroids or other mass visualized. Endometrium Thickness: 4 mm. There is small echogenic foci noted that could reflect calcifications are small foci of air. No endometrial fluid or mass. Right ovary Measurements: 3.8 x 1.6 x 2.1 cm. Normal appearance/no adnexal mass. Left ovary Measurements: 3.2 x 2.4 x 2.6 cm. Normal appearance/no adnexal mass. Pulsed Doppler evaluation of both ovaries demonstrates normal low-resistance arterial and venous waveforms. Other findings No abnormal free fluid. IMPRESSION: 1. No acute findings. No findings to account for right  lower quadrant pain. Specifically, no ovarian torsion. 2. Small echogenic foci along the endometrium. These are nonspecific. They may reflect small foci of air or calcifications. They are unlikely to be clinically significant. 3. Normal ovaries and adnexa. Electronically Signed   By: Lajean Manes M.D.   On: 04/30/2018 17:16   US Pelvis Complete  Result Date: 04/30/2018 CLINICAL DATA:  Right lower quadrant to right pelvic pain. Evaluate for possible ovarian torsion. EXAM: TRANSABDOMINAL AND TRANSVAGINAL ULTRASOUND OF PELVIS DOPPLER ULTRASOUND OF OVARIES TECHNIQUE: Both transabdominal and transvaginal ultrasound examinations of the pelvis were performed. Transabdominal technique was performed for global imaging of the pelvis including uterus, ovaries, adnexal regions, and pelvic cul-de-sac. It was necessary to proceed with endovaginal exam following the transabdominal exam to visualize the visualize the uterus, endometrium and ovaries to better advantage. Color and duplex Doppler ultrasound was utilized to evaluate blood flow to the ovaries. COMPARISON:  CT, 05/24/2015 FINDINGS: Uterus Measurements: 7.4 x 3.6 x 6.5 cm. No fibroids or other mass visualized. Endometrium Thickness: 4 mm. There is small echogenic foci noted that could reflect calcifications are small foci of air. No endometrial fluid or mass. Right ovary Measurements: 3.8 x 1.6 x 2.1 cm. Normal appearance/no adnexal mass. Left ovary Measurements: 3.2 x 2.4 x 2.6 cm. Normal appearance/no adnexal mass. Pulsed Doppler evaluation of both ovaries demonstrates normal low-resistance arterial and venous waveforms. Other findings No abnormal free fluid. IMPRESSION: 1. No acute findings. No findings to account for right lower quadrant pain. Specifically, no ovarian torsion. 2. Small echogenic foci along the endometrium. These are nonspecific. They may reflect small foci of air or calcifications. They are unlikely to be clinically significant. 3. Normal ovaries  and adnexa. Electronically Signed   By: Lajean Manes M.D.   On: 04/30/2018 17:16   [place] Art/ven Flow Abd Pelv Doppler  Result Date: 04/30/2018 CLINICAL DATA:  Right lower quadrant to right pelvic pain. Evaluate for possible ovarian torsion. EXAM: TRANSABDOMINAL AND TRANSVAGINAL ULTRASOUND OF PELVIS DOPPLER ULTRASOUND OF OVARIES TECHNIQUE: Both transabdominal and transvaginal ultrasound examinations of the pelvis were performed. Transabdominal technique was performed for global imaging of the pelvis including uterus, ovaries, adnexal regions, and pelvic cul-de-sac. It was necessary to proceed with endovaginal exam following the transabdominal exam to visualize the visualize the uterus, endometrium and ovaries to better advantage. Color and duplex Doppler ultrasound was utilized to evaluate blood flow  to the ovaries. COMPARISON:  CT, 05/24/2015 FINDINGS: Uterus Measurements: 7.4 x 3.6 x 6.5 cm. No fibroids or other mass visualized. Endometrium Thickness: 4 mm. There is small echogenic foci noted that could reflect calcifications are small foci of air. No endometrial fluid or mass. Right ovary Measurements: 3.8 x 1.6 x 2.1 cm. Normal appearance/no adnexal mass. Left ovary Measurements: 3.2 x 2.4 x 2.6 cm. Normal appearance/no adnexal mass. Pulsed Doppler evaluation of both ovaries demonstrates normal low-resistance arterial and venous waveforms. Other findings No abnormal free fluid. IMPRESSION: 1. No acute findings. No findings to account for right lower quadrant pain. Specifically, no ovarian torsion. 2. Small echogenic foci along the endometrium. These are nonspecific. They may reflect small foci of air or calcifications. They are unlikely to be clinically significant. 3. Normal ovaries and adnexa. Electronically Signed   By: Lajean Manes M.D.   On: 04/30/2018 17:16    Procedures Procedures (including critical care time)  Medications Ordered in ED Medications  fentaNYL (SUBLIMAZE) injection 50 mcg (50  mcg Intravenous Given 04/30/18 1439)  sodium chloride 0.9 % bolus 1,000 mL (0 mLs Intravenous Stopped 04/30/18 1744)  ondansetron (ZOFRAN) injection 4 mg (4 mg Intravenous Given 04/30/18 1437)     Initial Impression / Assessment and Plan / ED Course  I have reviewed the triage vital signs and the nursing notes.  Pertinent labs & imaging results that were available during my care of the patient were reviewed by me and considered in my medical decision making (see chart for details).     Patient's ultrasound is reassuring.  No ovarian cyst or torsion.  Unclear why she is having the right lower quadrant pain in association with the bleeding.  She is not pregnant.  I discussed risks/benefits of doing CT scan but I do have low concern for appendicitis or ureteral stone in this scenario.  She declines CT at this time and has been instructed to return if her symptoms were to worsen or not improved.  Take ibuprofen or Tylenol.  Follow-up with PCP who manages her GYN care.  Final Clinical Impressions(s) / ED Diagnoses   Final diagnoses:  Abnormal vaginal bleeding  Right lower quadrant pain    ED Discharge Orders    None       Sherwood Gambler, MD 04/30/18 1815

## 2018-04-30 NOTE — ED Notes (Signed)
Awaiting results and dispo.

## 2018-04-30 NOTE — ED Notes (Signed)
To US

## 2018-04-30 NOTE — ED Notes (Signed)
Pt had normal menses  Now with bleeding and pain to RLQ  Reports she has had her tubes tied  Had appt with her PCP this afternoon but elected to come here in case she needed further testing

## 2018-04-30 NOTE — ED Triage Notes (Signed)
Pt LMP July 4. States two days ago she began having heavy vaginal bleeding and passing large clots. Pt has had a tubal ligation. States pain started off as generalized aching, now has moved to RT lower pelvis and is sharp.

## 2018-04-30 NOTE — ED Notes (Signed)
In US 

## 2018-05-03 ENCOUNTER — Emergency Department (HOSPITAL_COMMUNITY): Payer: 59

## 2018-05-03 ENCOUNTER — Encounter (HOSPITAL_COMMUNITY): Payer: Self-pay | Admitting: Emergency Medicine

## 2018-05-03 ENCOUNTER — Other Ambulatory Visit: Payer: Self-pay

## 2018-05-03 ENCOUNTER — Observation Stay (HOSPITAL_COMMUNITY): Payer: 59 | Admitting: Certified Registered Nurse Anesthetist

## 2018-05-03 ENCOUNTER — Encounter (HOSPITAL_COMMUNITY)
Admission: EM | Disposition: A | Discharge status: Home / Self Care | Payer: Self-pay | Source: Home / Self Care | Attending: Emergency Medicine

## 2018-05-03 ENCOUNTER — Observation Stay (HOSPITAL_COMMUNITY)
Admission: EM | Admit: 2018-05-03 | Discharge: 2018-05-04 | Disposition: A | Discharge status: Home / Self Care | Payer: 59 | Attending: General Surgery | Admitting: General Surgery

## 2018-05-03 DIAGNOSIS — F988 Other specified behavioral and emotional disorders with onset usually occurring in childhood and adolescence: Secondary | ICD-10-CM | POA: Insufficient documentation

## 2018-05-03 DIAGNOSIS — F319 Bipolar disorder, unspecified: Secondary | ICD-10-CM | POA: Diagnosis not present

## 2018-05-03 DIAGNOSIS — M549 Dorsalgia, unspecified: Secondary | ICD-10-CM | POA: Insufficient documentation

## 2018-05-03 DIAGNOSIS — F411 Generalized anxiety disorder: Secondary | ICD-10-CM | POA: Diagnosis not present

## 2018-05-03 DIAGNOSIS — Z79899 Other long term (current) drug therapy: Secondary | ICD-10-CM | POA: Diagnosis not present

## 2018-05-03 DIAGNOSIS — R109 Unspecified abdominal pain: Secondary | ICD-10-CM | POA: Diagnosis present

## 2018-05-03 DIAGNOSIS — K3580 Unspecified acute appendicitis: Principal | ICD-10-CM | POA: Insufficient documentation

## 2018-05-03 DIAGNOSIS — F17210 Nicotine dependence, cigarettes, uncomplicated: Secondary | ICD-10-CM | POA: Insufficient documentation

## 2018-05-03 DIAGNOSIS — G8929 Other chronic pain: Secondary | ICD-10-CM | POA: Insufficient documentation

## 2018-05-03 DIAGNOSIS — F909 Attention-deficit hyperactivity disorder, unspecified type: Secondary | ICD-10-CM | POA: Diagnosis not present

## 2018-05-03 DIAGNOSIS — R1031 Right lower quadrant pain: Secondary | ICD-10-CM | POA: Diagnosis not present

## 2018-05-03 DIAGNOSIS — K388 Other specified diseases of appendix: Secondary | ICD-10-CM | POA: Diagnosis not present

## 2018-05-03 DIAGNOSIS — K37 Unspecified appendicitis: Secondary | ICD-10-CM | POA: Diagnosis not present

## 2018-05-03 DIAGNOSIS — R110 Nausea: Secondary | ICD-10-CM | POA: Diagnosis not present

## 2018-05-03 DIAGNOSIS — G43909 Migraine, unspecified, not intractable, without status migrainosus: Secondary | ICD-10-CM | POA: Diagnosis not present

## 2018-05-03 DIAGNOSIS — N7093 Salpingitis and oophoritis, unspecified: Secondary | ICD-10-CM | POA: Insufficient documentation

## 2018-05-03 DIAGNOSIS — K432 Incisional hernia without obstruction or gangrene: Secondary | ICD-10-CM | POA: Insufficient documentation

## 2018-05-03 HISTORY — PX: LAPAROSCOPIC APPENDECTOMY: SHX408

## 2018-05-03 LAB — COMPREHENSIVE METABOLIC PANEL
ALBUMIN: 3.6 g/dL (ref 3.5–5.0)
ALK PHOS: 101 U/L (ref 38–126)
ALT: 32 U/L (ref 0–44)
AST: 23 U/L (ref 15–41)
Anion gap: 11 (ref 5–15)
BILIRUBIN TOTAL: 0.8 mg/dL (ref 0.3–1.2)
BUN: 11 mg/dL (ref 6–20)
CHLORIDE: 104 mmol/L (ref 98–111)
CO2: 25 mmol/L (ref 22–32)
CREATININE: 0.76 mg/dL (ref 0.44–1.00)
Calcium: 9.2 mg/dL (ref 8.9–10.3)
GFR calc Af Amer: >60 mL/min (ref >60)
GFR calc non Af Amer: >60 mL/min (ref >60)
Glucose, Bld: 93 mg/dL (ref 70–99)
Potassium: 4 mmol/L (ref 3.5–5.1)
Sodium: 140 mmol/L (ref 135–145)
TOTAL PROTEIN: 7.4 g/dL (ref 6.5–8.1)

## 2018-05-03 LAB — CBC WITH DIFFERENTIAL/PLATELET
ABS IMMATURE GRANULOCYTES: 0 10*3/uL (ref 0.0–0.1)
BASOS ABS: 0 10*3/uL (ref 0.0–0.1)
Basophils Relative: 0 %
EOS PCT: 2 %
Eosinophils Absolute: 0.2 10*3/uL (ref 0.0–0.7)
HCT: 44.6 % (ref 36.0–46.0)
HEMOGLOBIN: 14.2 g/dL (ref 12.0–15.0)
Immature Granulocytes: 0 %
LYMPHS PCT: 18 %
Lymphs Abs: 1.7 10*3/uL (ref 0.7–4.0)
MCH: 26.6 pg (ref 26.0–34.0)
MCHC: 31.8 g/dL (ref 30.0–36.0)
MCV: 83.5 fL (ref 78.0–100.0)
MONO ABS: 1 10*3/uL (ref 0.1–1.0)
MONOS PCT: 10 %
NEUTROS ABS: 6.6 10*3/uL (ref 1.7–7.7)
Neutrophils Relative %: 70 %
Platelets: 221 10*3/uL (ref 150–400)
RBC: 5.34 MIL/uL — ABNORMAL HIGH (ref 3.87–5.11)
RDW: 13.6 % (ref 11.5–15.5)
WBC: 9.5 10*3/uL (ref 4.0–10.5)

## 2018-05-03 LAB — URINALYSIS, ROUTINE W REFLEX MICROSCOPIC
BILIRUBIN URINE: NEGATIVE
GLUCOSE, UA: NEGATIVE mg/dL
HGB URINE DIPSTICK: NEGATIVE
Ketones, ur: 5 mg/dL — AB
Leukocytes, UA: NEGATIVE
Nitrite: NEGATIVE
Protein, ur: NEGATIVE mg/dL
SPECIFIC GRAVITY, URINE: 1.006 (ref 1.005–1.030)
pH: 6 (ref 5.0–8.0)

## 2018-05-03 LAB — LIPASE, BLOOD: LIPASE: 27 U/L (ref 11–51)

## 2018-05-03 LAB — GC/CHLAMYDIA PROBE AMP (~~LOC~~) NOT AT ARMC
Chlamydia: NEGATIVE
Neisseria Gonorrhea: NEGATIVE

## 2018-05-03 LAB — I-STAT CG4 LACTIC ACID, ED: Lactic Acid, Venous: 0.7 mmol/L (ref 0.5–1.9)

## 2018-05-03 LAB — I-STAT BETA HCG BLOOD, ED (MC, WL, AP ONLY): I-stat hCG, quantitative: <5 m[IU]/mL (ref <5)

## 2018-05-03 SURGERY — APPENDECTOMY, LAPAROSCOPIC
Anesthesia: General | Site: Abdomen

## 2018-05-03 MED ORDER — BUPIVACAINE-EPINEPHRINE 0.25% -1:200000 IJ SOLN
INTRAMUSCULAR | Status: DC | PRN
  Administered 2018-05-03: 30 mL

## 2018-05-03 MED ORDER — ONDANSETRON HCL 4 MG/2ML IJ SOLN
4.0000 mg | Freq: Four times a day (QID) | INTRAMUSCULAR | Status: DC | PRN
  Administered 2018-05-04 (×2): 4 mg via INTRAVENOUS
  Filled 2018-05-03 (×2): qty 2

## 2018-05-03 MED ORDER — DIPHENHYDRAMINE HCL 50 MG/ML IJ SOLN: 25.0000 mg | Freq: Four times a day (QID) | INTRAMUSCULAR | Status: DC | PRN

## 2018-05-03 MED ORDER — SCOPOLAMINE 1 MG/3DAYS TD PT72
1.0000 | MEDICATED_PATCH | TRANSDERMAL | Status: DC
  Administered 2018-05-03: 1.5 mg via TRANSDERMAL
  Filled 2018-05-03: qty 1

## 2018-05-03 MED ORDER — ROCURONIUM BROMIDE 10 MG/ML (PF) SYRINGE
PREFILLED_SYRINGE | INTRAVENOUS | Status: DC | PRN
  Administered 2018-05-03: 40 mg via INTRAVENOUS

## 2018-05-03 MED ORDER — HYDROMORPHONE HCL 1 MG/ML IJ SOLN: 0.2500 mg | INTRAMUSCULAR | Status: DC | PRN

## 2018-05-03 MED ORDER — PROPOFOL 10 MG/ML IV BOLUS
INTRAVENOUS | Status: AC
  Filled 2018-05-03: qty 20

## 2018-05-03 MED ORDER — SCOPOLAMINE 1 MG/3DAYS TD PT72
MEDICATED_PATCH | TRANSDERMAL | Status: AC
  Filled 2018-05-03: qty 1

## 2018-05-03 MED ORDER — MIDAZOLAM HCL 5 MG/5ML IJ SOLN
INTRAMUSCULAR | Status: DC | PRN
  Administered 2018-05-03: 2 mg via INTRAVENOUS

## 2018-05-03 MED ORDER — SUGAMMADEX SODIUM 200 MG/2ML IV SOLN
INTRAVENOUS | Status: DC | PRN
  Administered 2018-05-03: 150 mg via INTRAVENOUS

## 2018-05-03 MED ORDER — MORPHINE SULFATE (PF) 4 MG/ML IV SOLN
4.0000 mg | Freq: Once | INTRAVENOUS | Status: AC
  Administered 2018-05-03: 4 mg via INTRAVENOUS
  Filled 2018-05-03: qty 1

## 2018-05-03 MED ORDER — METRONIDAZOLE IN NACL 5-0.79 MG/ML-% IV SOLN
500.0000 mg | Freq: Once | INTRAVENOUS | Status: AC
  Administered 2018-05-03: 500 mg via INTRAVENOUS
  Filled 2018-05-03: qty 100

## 2018-05-03 MED ORDER — ONDANSETRON HCL 4 MG/2ML IJ SOLN
4.0000 mg | Freq: Once | INTRAMUSCULAR | Status: AC
  Administered 2018-05-03: 4 mg via INTRAVENOUS
  Filled 2018-05-03: qty 2

## 2018-05-03 MED ORDER — DEXAMETHASONE SODIUM PHOSPHATE 10 MG/ML IJ SOLN
INTRAMUSCULAR | Status: AC
  Filled 2018-05-03: qty 2

## 2018-05-03 MED ORDER — LACTATED RINGERS IV SOLN
INTRAVENOUS | Status: DC | PRN
  Administered 2018-05-03 (×2): via INTRAVENOUS

## 2018-05-03 MED ORDER — OXYCODONE HCL 5 MG PO TABS
5.0000 mg | ORAL_TABLET | ORAL | Status: DC | PRN
  Administered 2018-05-03 – 2018-05-04 (×3): 10 mg via ORAL
  Filled 2018-05-03 (×3): qty 2

## 2018-05-03 MED ORDER — DOXYCYCLINE HYCLATE 100 MG PO TABS
100.0000 mg | ORAL_TABLET | Freq: Two times a day (BID) | ORAL | Status: DC
  Administered 2018-05-03 – 2018-05-04 (×2): 100 mg via ORAL
  Filled 2018-05-03 (×2): qty 1

## 2018-05-03 MED ORDER — MORPHINE SULFATE (PF) 4 MG/ML IV SOLN: 1.0000 mg | INTRAVENOUS | Status: DC | PRN

## 2018-05-03 MED ORDER — SODIUM CHLORIDE 0.9 % IV BOLUS
2000.0000 mL | Freq: Once | INTRAVENOUS | Status: AC
Start: 2018-05-03 — End: 2018-05-03
  Administered 2018-05-03: 2000 mL via INTRAVENOUS

## 2018-05-03 MED ORDER — FENTANYL CITRATE (PF) 250 MCG/5ML IJ SOLN
INTRAMUSCULAR | Status: AC
  Filled 2018-05-03: qty 5

## 2018-05-03 MED ORDER — ROCURONIUM BROMIDE 10 MG/ML (PF) SYRINGE
PREFILLED_SYRINGE | INTRAVENOUS | Status: AC
  Filled 2018-05-03: qty 20

## 2018-05-03 MED ORDER — BUPIVACAINE-EPINEPHRINE (PF) 0.25% -1:200000 IJ SOLN
INTRAMUSCULAR | Status: AC
Start: 2018-05-03 — End: ?
  Filled 2018-05-03: qty 30

## 2018-05-03 MED ORDER — PROMETHAZINE HCL 25 MG/ML IJ SOLN: 6.2500 mg | INTRAMUSCULAR | Status: DC | PRN

## 2018-05-03 MED ORDER — SODIUM CHLORIDE 0.9 % IV SOLN
INTRAVENOUS | Status: DC | PRN
  Administered 2018-05-03: 19:00:00 via INTRAVENOUS

## 2018-05-03 MED ORDER — ONDANSETRON 4 MG PO TBDP: 4.0000 mg | ORAL_TABLET | Freq: Four times a day (QID) | ORAL | Status: DC | PRN

## 2018-05-03 MED ORDER — ONDANSETRON HCL 4 MG/2ML IJ SOLN
INTRAMUSCULAR | Status: AC
  Filled 2018-05-03: qty 4

## 2018-05-03 MED ORDER — KETOROLAC TROMETHAMINE 15 MG/ML IJ SOLN
15.0000 mg | Freq: Three times a day (TID) | INTRAMUSCULAR | Status: DC | PRN
  Administered 2018-05-03 – 2018-05-04 (×2): 15 mg via INTRAVENOUS
  Filled 2018-05-03 (×2): qty 1

## 2018-05-03 MED ORDER — 0.9 % SODIUM CHLORIDE (POUR BTL) OPTIME
TOPICAL | Status: DC | PRN
  Administered 2018-05-03: 1000 mL

## 2018-05-03 MED ORDER — SUCCINYLCHOLINE CHLORIDE 20 MG/ML IJ SOLN
INTRAMUSCULAR | Status: DC | PRN
  Administered 2018-05-03: 50 mg via INTRAVENOUS
  Administered 2018-05-03: 100 mg via INTRAVENOUS

## 2018-05-03 MED ORDER — LIDOCAINE 2% (20 MG/ML) 5 ML SYRINGE
INTRAMUSCULAR | Status: DC | PRN
  Administered 2018-05-03: 40 mg via INTRAVENOUS

## 2018-05-03 MED ORDER — IOHEXOL 300 MG/ML  SOLN
100.0000 mL | Freq: Once | INTRAMUSCULAR | Status: AC | PRN
  Administered 2018-05-03: 100 mL via INTRAVENOUS

## 2018-05-03 MED ORDER — FENTANYL CITRATE (PF) 100 MCG/2ML IJ SOLN
INTRAMUSCULAR | Status: DC | PRN
  Administered 2018-05-03 (×2): 50 ug via INTRAVENOUS

## 2018-05-03 MED ORDER — ACETAMINOPHEN 500 MG PO TABS
1000.0000 mg | ORAL_TABLET | Freq: Four times a day (QID) | ORAL | Status: DC
  Administered 2018-05-03 – 2018-05-04 (×4): 1000 mg via ORAL
  Filled 2018-05-03 (×4): qty 2

## 2018-05-03 MED ORDER — MIDAZOLAM HCL 2 MG/2ML IJ SOLN
INTRAMUSCULAR | Status: AC
  Filled 2018-05-03: qty 2

## 2018-05-03 MED ORDER — METRONIDAZOLE IN NACL 5-0.79 MG/ML-% IV SOLN
500.0000 mg | Freq: Three times a day (TID) | INTRAVENOUS | Status: DC
  Filled 2018-05-03: qty 100

## 2018-05-03 MED ORDER — SODIUM CHLORIDE 0.9 % IV SOLN
2.0000 g | Freq: Once | INTRAVENOUS | Status: AC
  Administered 2018-05-03: 2 g via INTRAVENOUS
  Filled 2018-05-03: qty 20

## 2018-05-03 MED ORDER — PROMETHAZINE HCL 25 MG/ML IJ SOLN
12.5000 mg | Freq: Once | INTRAMUSCULAR | Status: AC
  Administered 2018-05-03: 12.5 mg via INTRAVENOUS
  Filled 2018-05-03: qty 1

## 2018-05-03 MED ORDER — PROPOFOL 10 MG/ML IV BOLUS
INTRAVENOUS | Status: DC | PRN
  Administered 2018-05-03: 150 mg via INTRAVENOUS

## 2018-05-03 MED ORDER — PHENYLEPHRINE 40 MCG/ML (10ML) SYRINGE FOR IV PUSH (FOR BLOOD PRESSURE SUPPORT)
PREFILLED_SYRINGE | INTRAVENOUS | Status: AC
  Filled 2018-05-03: qty 20

## 2018-05-03 MED ORDER — MIDAZOLAM HCL 2 MG/2ML IJ SOLN: 0.5000 mg | Freq: Once | INTRAMUSCULAR | Status: DC | PRN

## 2018-05-03 MED ORDER — MORPHINE SULFATE (PF) 2 MG/ML IV SOLN
2.0000 mg | INTRAVENOUS | Status: DC | PRN
  Administered 2018-05-04: 2 mg via INTRAVENOUS
  Filled 2018-05-03: qty 1

## 2018-05-03 MED ORDER — LIDOCAINE 2% (20 MG/ML) 5 ML SYRINGE
INTRAMUSCULAR | Status: AC
  Filled 2018-05-03: qty 10

## 2018-05-03 MED ORDER — SODIUM CHLORIDE 0.9 % IV SOLN
2.0000 g | Freq: Four times a day (QID) | INTRAVENOUS | Status: DC
  Administered 2018-05-03 – 2018-05-04 (×4): 2 g via INTRAVENOUS
  Filled 2018-05-03 (×7): qty 2

## 2018-05-03 MED ORDER — PHENYLEPHRINE HCL 10 MG/ML IJ SOLN
INTRAMUSCULAR | Status: DC | PRN
  Administered 2018-05-03: 80 ug via INTRAVENOUS
  Administered 2018-05-03: 40 ug via INTRAVENOUS
  Administered 2018-05-03 (×3): 80 ug via INTRAVENOUS

## 2018-05-03 MED ORDER — IOPAMIDOL (ISOVUE-300) INJECTION 61%
INTRAVENOUS | Status: AC
  Filled 2018-05-03: qty 30

## 2018-05-03 MED ORDER — VENLAFAXINE HCL ER 37.5 MG PO CP24
37.5000 mg | ORAL_CAPSULE | Freq: Every day | ORAL | Status: DC
  Administered 2018-05-03: 37.5 mg via ORAL
  Filled 2018-05-03 (×2): qty 1

## 2018-05-03 MED ORDER — MEPERIDINE HCL 50 MG/ML IJ SOLN: 6.2500 mg | INTRAMUSCULAR | Status: DC | PRN

## 2018-05-03 MED ORDER — STERILE WATER FOR IRRIGATION IR SOLN
Status: DC | PRN
  Administered 2018-05-03: 1000 mL

## 2018-05-03 MED ORDER — NEOSTIGMINE METHYLSULFATE 5 MG/5ML IV SOSY
PREFILLED_SYRINGE | INTRAVENOUS | Status: AC
  Filled 2018-05-03: qty 5

## 2018-05-03 MED ORDER — DEXAMETHASONE SODIUM PHOSPHATE 10 MG/ML IJ SOLN
INTRAMUSCULAR | Status: DC | PRN
  Administered 2018-05-03: 10 mg via INTRAVENOUS

## 2018-05-03 MED ORDER — SODIUM CHLORIDE 0.9 % IR SOLN
Status: DC | PRN
  Administered 2018-05-03: 1000 mL

## 2018-05-03 MED ORDER — SODIUM CHLORIDE 0.9 % IV SOLN: 2.0000 g | INTRAVENOUS | Status: DC

## 2018-05-03 MED ORDER — DIPHENHYDRAMINE HCL 25 MG PO CAPS: 25.0000 mg | ORAL_CAPSULE | Freq: Four times a day (QID) | ORAL | Status: DC | PRN

## 2018-05-03 SURGICAL SUPPLY — 44 items
APL SKNCLS STERI-STRIP NONHPOA (GAUZE/BANDAGES/DRESSINGS) ×1
BAG SPEC RTRVL 10 TROC 200 (ENDOMECHANICALS)
BANDAGE ADH SHEER 1  50/CT (GAUZE/BANDAGES/DRESSINGS) ×2 IMPLANT
BENZOIN TINCTURE PRP APPL 2/3 (GAUZE/BANDAGES/DRESSINGS) ×1 IMPLANT
CANISTER SUCT 3000ML PPV (MISCELLANEOUS) ×2 IMPLANT
CHLORAPREP W/TINT 26ML (MISCELLANEOUS) ×2 IMPLANT
COVER SURGICAL LIGHT HANDLE (MISCELLANEOUS) ×2 IMPLANT
CUTTER FLEX LINEAR 45M (STAPLE) ×2 IMPLANT
DRAIN CHANNEL 19F RND (DRAIN) IMPLANT
DRSG TEGADERM 4X4.75 (GAUZE/BANDAGES/DRESSINGS) ×1 IMPLANT
ELECT REM PT RETURN 9FT ADLT (ELECTROSURGICAL) ×2
ELECTRODE REM PT RTRN 9FT ADLT (ELECTROSURGICAL) ×1 IMPLANT
EVACUATOR SILICONE 100CC (DRAIN) IMPLANT
GAUZE SPONGE 2X2 8PLY STRL LF (GAUZE/BANDAGES/DRESSINGS) IMPLANT
GLOVE BIOGEL M STRL SZ7.5 (GLOVE) ×2 IMPLANT
GLOVE BIOGEL PI IND STRL 8 (GLOVE) ×2 IMPLANT
GLOVE BIOGEL PI INDICATOR 8 (GLOVE) ×2
GOWN STRL REUS W/ TWL LRG LVL3 (GOWN DISPOSABLE) ×2 IMPLANT
GOWN STRL REUS W/TWL 2XL LVL3 (GOWN DISPOSABLE) ×2 IMPLANT
GOWN STRL REUS W/TWL LRG LVL3 (GOWN DISPOSABLE) ×4
GRASPER SUT TROCAR 14GX15 (MISCELLANEOUS) ×1 IMPLANT
KIT BASIN OR (CUSTOM PROCEDURE TRAY) ×2 IMPLANT
KIT TURNOVER KIT B (KITS) ×2 IMPLANT
NS IRRIG 1000ML POUR BTL (IV SOLUTION) ×2 IMPLANT
PAD ARMBOARD 7.5X6 YLW CONV (MISCELLANEOUS) ×4 IMPLANT
POUCH RETRIEVAL ECOSAC 10 (ENDOMECHANICALS) IMPLANT
POUCH RETRIEVAL ECOSAC 10MM (ENDOMECHANICALS)
RELOAD 45 VASCULAR/THIN (ENDOMECHANICALS) ×2 IMPLANT
RELOAD STAPLE 45 2.5 WHT GRN (ENDOMECHANICALS) IMPLANT
SET IRRIG TUBING LAPAROSCOPIC (IRRIGATION / IRRIGATOR) ×2 IMPLANT
SHEARS HARMONIC ACE PLUS 36CM (ENDOMECHANICALS) ×2 IMPLANT
SLEEVE ENDOPATH XCEL 5M (ENDOMECHANICALS) ×2 IMPLANT
SPECIMEN JAR SMALL (MISCELLANEOUS) ×2 IMPLANT
SPONGE GAUZE 2X2 STER 10/PKG (GAUZE/BANDAGES/DRESSINGS) ×1
STRIP CLOSURE SKIN 1/2X4 (GAUZE/BANDAGES/DRESSINGS) ×2 IMPLANT
SUT ETHILON 2 0 FS 18 (SUTURE) IMPLANT
SUT MNCRL AB 4-0 PS2 18 (SUTURE) ×2 IMPLANT
SUT VICRYL 0 UR6 27IN ABS (SUTURE) ×1 IMPLANT
TRAY FOLEY CATH SILVER 16FR (SET/KITS/TRAYS/PACK) ×2 IMPLANT
TRAY LAPAROSCOPIC MC (CUSTOM PROCEDURE TRAY) ×2 IMPLANT
TROCAR XCEL BLUNT TIP 100MML (ENDOMECHANICALS) ×2 IMPLANT
TROCAR XCEL NON-BLD 5MMX100MML (ENDOMECHANICALS) ×2 IMPLANT
TUBING INSUFFLATION (TUBING) ×2 IMPLANT
WATER STERILE IRR 1000ML POUR (IV SOLUTION) ×2 IMPLANT

## 2018-05-03 NOTE — Progress Notes (Signed)
Couldn't find husband in waiting room Not reachable by phone or pt's sister Left a very limited message on husband's cell.   Leighton Ruff. Redmond Pulling, MD, FACS General, Bariatric, & Minimally Invasive Surgery [facility], [place]

## 2018-05-03 NOTE — Anesthesia Preprocedure Evaluation (Addendum)
Anesthesia Evaluation  Patient identified by MRN, date of birth, ID band Patient awake    Reviewed: Allergy & Precautions, NPO status , Patient's Chart, lab work & pertinent test results  History of Anesthesia Complications Negative for: history of anesthetic complications  Airway Mallampati: II  TM Distance: >3 FB Neck ROM: Full    Dental  (+) Dental Advisory Given   Pulmonary Current Smoker,    breath sounds clear to auscultation       Cardiovascular negative cardio ROS   Rhythm:Regular Rate:Normal     Neuro/Psych  Headaches, PSYCHIATRIC DISORDERS (ADD) Anxiety Depression Bipolar Disorder    GI/Hepatic Neg liver ROS, Abdominal pain   Endo/Other  negative endocrine ROS  Renal/GU negative Renal ROS     Musculoskeletal   Abdominal   Peds  Hematology negative hematology ROS (+)   Anesthesia Other Findings   Reproductive/Obstetrics S/p BTL                            Anesthesia Physical Anesthesia Plan  ASA: II  Anesthesia Plan: General   Post-op Pain Management:    Induction: Intravenous and Rapid sequence  PONV Risk Score and Plan: 3 and Scopolamine patch - Pre-op, Dexamethasone and Ondansetron  Airway Management Planned: Oral ETT  Additional Equipment:   Intra-op Plan:   Post-operative Plan: Extubation in OR  Informed Consent: I have reviewed the patients History and Physical, chart, labs and discussed the procedure including the risks, benefits and alternatives for the proposed anesthesia with the patient or authorized representative who has indicated his/her understanding and acceptance.   Dental advisory given  Plan Discussed with: CRNA and Surgeon  Anesthesia Plan Comments: (Plan routine monitors, GETA)        Anesthesia Quick Evaluation

## 2018-05-03 NOTE — ED Provider Notes (Signed)
[facility] Provider Note   CSN: [id number] Arrival date & time: 05/03/18  1324     History   Chief Complaint Chief Complaint  Patient presents with  . Abdominal Pain    HPI MAHATHI POKORNEY is a 37 y.o. female with a past medical history of bipolar 1, anxiety, back pain, who presents today for evaluation of right lower quadrant abdominal pain since Thursday.  She was seen on 04/30/2018 and [facility] where she had an ultrasound and a pelvic exam to evaluate her ovaries without torsion or other cause for her pain found.  Patient reports that the left any pain she was feeling better, however her symptoms quickly returned.  She reports that she has been unable to eat anything for the past day or 2.  Reports nausea without vomiting.  No diarrhea or constipation.  She reports that it does hurt when she pees and that anytime she does any sort of bearing down it gives her pain.  She reports a low-grade fever last night with a temp of 99.  No history of trauma or prior abdominal surgeries other than cholecystectomy and bilateral tubal ligation.  HPI  Past Medical History:  Diagnosis Date  . 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 Bipolar 1 disorder, mixed ([place])   . Bipolar disorder, mixed ([place])   . Depression   . Hair pulling   . HSIL on Pap smear of cervix 2013   [place]    Patient Active Problem List   Diagnosis Date Noted  . Palpitations 06/30/2016  . ADD (attention deficit disorder) 01/29/2016  . Migraines 09/18/2015  . Lumbar back pain with radiculopathy affecting right lower extremity 07/31/2015  . HSIL (high grade squamous intraepithelial lesion) on Pap smear of cervix 08/22/2013  . Bipolar I disorder ([place]) 08/22/2013  . Anxiety state, unspecified 03/02/2013  . Bulging lumbar disc 08/17/2012    Past Surgical History:  Procedure Laterality Date  . CHOLECYSTECTOMY    . TUBAL LIGATION       OB  History   None      Home Medications    Prior to Admission medications   Medication Sig Start Date End Date Taking? Authorizing Provider  amphetamine-dextroamphetamine (ADDERALL) 15 MG tablet Take 15 mg by mouth daily.    [provider]  busPIRone (BUSPAR) 10 MG tablet Take 1 tablet by mouth daily. 03/26/18   [provider]  cyclobenzaprine (FLEXERIL) 10 MG tablet Take 1 tablet (10 mg total) by mouth 3 (three) times daily as needed for muscle spasms. Patient not taking: Reported on 04/30/2018 04/08/18   Delsa Grana, PA-C  gabapentin (NEURONTIN) 300 MG capsule Take 300 mg by mouth at bedtime as needed (Leg pain).     [provider]  HYDROcodone-acetaminophen (NORCO) 5-325 MG tablet Take 1 tablet by mouth 3 (three) times daily as needed for severe pain. Patient taking differently: Take 1 tablet by mouth at bedtime.  04/08/18   Delsa Grana, PA-C  ibuprofen (ADVIL,MOTRIN) 200 MG tablet Take 400 mg by mouth daily as needed for moderate pain.    [provider]  metroNIDAZOLE (FLAGYL) 500 MG tablet Take 1 tablet (500 mg total) by mouth 2 (two) times daily. Patient not taking: Reported on 04/30/2018 02/19/18   Alycia Rossetti, MD  predniSONE (DELTASONE) 20 MG tablet Take 2 tablets (40 mg) PO daily x 5 days, then take 1 tab PO  daily x 5 days Patient not taking: Reported on 04/30/2018 04/08/18   Delsa Grana, PA-C  triamcinolone (KENALOG) 0.025 % ointment Apply 1 application topically 2 (two) times daily. 03/20/18   Evelina Dun A, FNP  venlafaxine (EFFEXOR) 37.5 MG tablet Take 37.5 mg by mouth daily.    [provider]    Family History Family History  Problem Relation Age of Onset  . Cancer Mother        lung  . Heart disease Mother   . Alcohol abuse Mother   . Bipolar disorder Mother   . Cancer Father        prostate     Social History Social History   Tobacco Use  . Smoking status: Current Every Day Smoker    Packs/day: 0.50    Types:  Cigarettes  . Smokeless tobacco: Never Used  Substance Use Topics  . Alcohol use: Yes    Alcohol/week: 0.0 oz    Comment: socially only less than monthly  . Drug use: No     Allergies   Lamictal [lamotrigine] and Prozac [fluoxetine hcl]   Review of Systems Review of Systems  Constitutional: Positive for appetite change and chills. Negative for fever.  HENT: Negative for congestion.   Respiratory: Negative for chest tightness.   Gastrointestinal: Positive for abdominal pain and nausea. Negative for diarrhea and vomiting.  Genitourinary: Positive for dysuria. Negative for frequency, urgency, vaginal bleeding and vaginal discharge.  Musculoskeletal: Negative for back pain and neck pain.  Skin: Negative for rash.  Neurological: Negative for headaches.     Physical Exam Updated Vital Signs BP 107/75 (BP Location: Left Arm)   Pulse 81   Temp 99.4 F (37.4 C) (Oral)   Resp 17   LMP 05/02/2018   SpO2 100%   Physical Exam  Constitutional: She appears well-developed and well-nourished.  tearful  HENT:  Head: Normocephalic and atraumatic.  Eyes: Conjunctivae are normal. Right eye exhibits no discharge. Left eye exhibits no discharge. No scleral icterus.  Neck: Normal range of motion.  Cardiovascular: Normal rate and regular rhythm.  Pulmonary/Chest: Effort normal. No stridor. No respiratory distress.  Abdominal: Normal appearance. She exhibits no distension. Bowel sounds are increased. There is tenderness in the right lower quadrant. There is tenderness at McBurney's point. There is no rigidity, no rebound and no guarding. No hernia.  Musculoskeletal: She exhibits no edema or deformity.  Neurological: She is alert. She exhibits normal muscle tone.  Skin: Skin is warm and dry. She is not diaphoretic.  Psychiatric: She has a normal mood and affect. Her behavior is normal.  Nursing note and vitals reviewed.    ED Treatments / Results  Labs (all labs ordered are listed, but  only abnormal results are displayed) Labs Reviewed  CBC WITH DIFFERENTIAL/PLATELET - Abnormal; Notable for the following components:      Result Value   RBC 5.34 (*)    All other components within normal limits  URINALYSIS, ROUTINE W REFLEX MICROSCOPIC - Abnormal; Notable for the following components:   Ketones, ur 5 (*)    All other components within normal limits  COMPREHENSIVE METABOLIC PANEL  LIPASE, BLOOD  I-STAT BETA HCG BLOOD, ED ([facility], [facility], [facility] ONLY)  I-STAT CG4 LACTIC ACID, ED    EKG None  Radiology Ct Abdomen Pelvis W Contrast  Result Date: 05/03/2018 CLINICAL DATA:  Right lower quadrant pain EXAM: CT ABDOMEN AND PELVIS WITH CONTRAST TECHNIQUE: Multidetector CT imaging of the abdomen and pelvis was performed using the  standard protocol following bolus administration of intravenous contrast. Oral contrast was also administered. CONTRAST:  100 mL Omnipaque 300 nonionic COMPARISON:  May 24, 2015 CT abdomen and pelvis. Pelvic ultrasound April 30, 2018 FINDINGS: Lower chest: Lung bases are clear. Hepatobiliary: There is fatty infiltration near the fissure for the ligamentum teres. No focal liver lesions are evident. Gallbladder is absent. There is no appreciable biliary duct dilatation. Pancreas: No pancreatic mass or inflammatory focus. Spleen: Spleen appears upper normal in size without focal splenic lesion evident. Adrenals/Urinary Tract: Adrenals bilaterally appear unremarkable. Kidneys bilaterally show no evident mass or hydronephrosis on either side. There is no evident renal or ureteral calculus on either side. Urinary bladder is midline with wall thickness within normal limits. Stomach/Bowel: There is moderate stool in the colon. There is no appreciable bowel wall or mesenteric thickening. No bowel obstruction evident. No free air or portal venous air. Vascular/Lymphatic: There is no appreciable abdominal aortic aneurysm. No vascular lesions are evident. There is no appreciable  adenopathy in the abdomen or pelvis. Reproductive: Uterus is anteverted. There is no well-defined pelvic mass. Fluid tracks from the right ovary into the cul-de-sac. Note that there is evidence of inflammation immediately adjacent to the right ovary, felt to be due to appendiceal inflammation. Other: The appendix demonstrates a thickened wall measuring 9 mm with slight appendiceal wall enhancement. The appendix becomes somewhat more globular distally adjacent to the right ovary. There is no free air or frank abscess in this area of apparent acute appendiceal inflammation. There is no ascites beyond mild fluid in the cul-de-sac. No frank abscess is evident in the abdomen or pelvis. There is a minimal ventral hernia containing only fat. Musculoskeletal: There are no blastic or lytic bone lesions. No intramuscular or abdominal wall lesions are evident. IMPRESSION: 1.  Findings indicative of acute appendiceal inflammation. Appendix: Location: Mid pelvis slightly to the right of midline arising from the posterior cecum and extending posteriorly and slightly inferiorly. Diameter: 9 mm, becoming more globular distally. Appendicolith: None Mucosal hyper-enhancement: Mild Extraluminal gas: None Periappendiceal collection: Mild stranding but no well-defined fluid collection. Note that apparent inflammation in the distal appendix is near the right ovary. 2. Fluid tracks from the right ovary into the cul-de-sac. Question recent ovarian cyst rupture versus sympathetic response from nearby acute inflammatory change of the appendix. 3.  No abscess evident in the abdomen pelvis.  No bowel obstruction. 4. No renal or ureteral calculus. No hydronephrosis on either side. 5.  Minimal ventral hernia containing only fat. Critical Value/emergent results were called by telephone at the time of interpretation on 05/03/2018 at 11:31 am to [facility], who verbally acknowledged these results. Electronically Signed   By: Lowella Grip III M.D.   On: 05/03/2018 11:31    Procedures Procedures (including critical care time)  Medications Ordered in ED Medications  iopamidol (ISOVUE-300) 61 % injection (has no administration in time range)  cefTRIAXone (ROCEPHIN) 2 g in sodium chloride 0.9 % 100 mL IVPB (2 g Intravenous New Bag/Given 05/03/18 1150)    And  metroNIDAZOLE (FLAGYL) IVPB 500 mg (has no administration in time range)  morphine 4 MG/ML injection 4 mg (4 mg Intravenous Given 05/03/18 0803)  ondansetron (ZOFRAN) injection 4 mg (4 mg Intravenous Given 05/03/18 0803)  sodium chloride 0.9 % bolus 2,000 mL (0 mLs Intravenous Stopped 05/03/18 0919)  iohexol (OMNIPAQUE) 300 MG/ML solution 100 mL (100 mLs Intravenous Contrast Given 05/03/18 1116)  promethazine (PHENERGAN) injection 12.5-25 mg (12.5 mg Intravenous Given  05/03/18 1200)  morphine 4 MG/ML injection 4 mg (4 mg Intravenous Given 05/03/18 1200)     Initial Impression / Assessment and Plan / ED Course  I have reviewed the triage vital signs and the nursing notes.  Pertinent labs & imaging results that were available during my care of the patient were reviewed by me and considered in my medical decision making (see chart for details).  Clinical Course as of May 03 1614  Mon May 03, 2018  1131 Call from radiologist, CT is acute appendicitis.    [EH]  1307 Spoke with general surgery who has admitted patient   [EH]    Clinical Course User Index [EH] Lorin Glass, PA-C   Eual Fines presents today for evaluation of continued abdominal pain.  When this first started she was having abnormal vaginal bleeding and had a pelvic ultrasound and pelvic exam which did not show any cause for this.  She has continued to have worsening pain.  CT abdomen pelvis showed concerns for acute appendicitis.  Patient's symptoms were treated in the emergency room with 2 L IV fluids, morphine, Zofran, and Phenergan.  General surgery paged and admitted patient. IV  antibiotics started.   Final Clinical Impressions(s) / ED Diagnoses   Final diagnoses:  Acute appendicitis, unspecified acute appendicitis type    ED Discharge Orders    None       Lorin Glass, PA-C 05/03/18 [place], [place], DO 05/04/18 [phone]

## 2018-05-03 NOTE — Op Note (Signed)
[name] [id number] 04-16-1981 05/03/2018  Appendectomy, Lap, Procedure Note  Indications: The patient presented with a history of right-sided abdominal pain. A CT revealed findings consistent with acute appendicitis.  A transvaginal ultrasound done 3 days ago revealed a normal pelvic structures  Pre-operative Diagnosis: Acute appendicitis without mention of peritonitis  Post-operative Diagnosis: Same + Right tubovarian abscess/pyosalpinx  Surgeon: Greer Pickerel MD FACS  Assistants: none  Anesthesia: General endotracheal anesthesia  Procedure Details  The patient was seen again in the Holding Room. The risks, benefits, complications, treatment options, and expected outcomes were discussed with the patient and/or family. The possibilities of perforation of viscus, bleeding, recurrent infection, the need for additional procedures, failure to diagnose a condition, and creating a complication requiring transfusion or operation were discussed. There was concurrence with the proposed plan and informed consent was obtained. The site of surgery was properly noted. The patient was taken to Operating Room, identified as [name] and the procedure verified as Appendectomy. A Time Out was held and the above information confirmed.  The patient was placed in the supine position and general anesthesia was induced, along with placement of orogastric tube, SCDs, and a Foley catheter. The abdomen was prepped and draped in a sterile fashion. A 1.5 centimeter infraumbilical incision was made through an old transverse infraumbilical incision.  She had a small incisional hernia at this location..  The umbilical stalk was elevated, and the midline fascia was incised with a #11 blade.  A Kelly clamp was used to confirm entrance into the peritoneal cavity.  A pursestring suture was passed around the incision with a 0 Vicryl.  A 49mm Hasson was introduced into the abdomen and the tails of the suture were used to  hold the Hasson in place.   The pneumoperitoneum was then established to steady pressure of 15 mmHg.  Additional 5 mm cannulas then placed in the left lower quadrant of the abdomen and the suprapubic region under direct visualization. A careful evaluation of the entire abdomen was carried out. The patient was placed in Trendelenburg and left lateral decubitus position. The small intestines were retracted in the cephalad and left lateral direction away from the pelvis and right lower quadrant. The patient was found to have an fairly normal  appearing appendix that was extending into the pelvis. There was no evidence of perforation.  The tip of the appendix was inflamed and indurated as well as the distal mesoappendix.  However the tip and distal mesoappendix were sitting on top of the right fallopian tube.  I lifted off the appendix and was able to visualize a very indurated infected appearing right tube.  I gently grasped the right fallopian tube and gently peeled it away from the right pelvic sidewall revealing drainage of pus from the right fallopian tube.  Because the tip of the appendix was inflamed and indurated I decided to proceed with appendectomy.  The appendix was carefully dissected. The appendix was was skeletonized with the harmonic scalpel.   The appendix was divided at its base using an endo-GIA stapler with a white load. No appendiceal stump was left in place. The appendix was removed from the abdomen with an ecco bag through the umbilical port.  I did consult GYN Dr. Rosana Hoes intraoperatively on the phone.  Even though the patient had had a tubal ligation she did not recommend removal of the tube since the patient was anesthetized without talking to the patient first.  She recommended IV antibiotics.  She did  not recommend a surgical drain.  They will see her in consultation postoperatively.  There was no evidence of bleeding, leakage, or complication after division of the appendix. Irrigation was  also performed and irrigate suctioned from the abdomen as well.  The umbilical port site was closed with the purse string suture. The closure was viewed laparoscopically.  An additional interrupted 0 Vicryl was placed at the umbilical fascia with a PMI suture passer.  Local was infiltrated in and around the umbilical fascia as well as the bilateral lateral abdominal walls as a tap block.  There was no residual palpable fascial defect.  The trocar site skin wounds were closed with 4-0 Monocryl.  Benzoin, Steri-Strips and sterile bandages was applied to the skin incisions.  Instrument, sponge, and needle counts were correct at the conclusion of the case.   Findings: The appendix tip was found to be inflamed. There were not signs of necrosis.  There was not perforation. There was not abscess formation.  The right fallopian tube was indurated, thickened and draining some pus.  I could not express any additional purulent drainage at the conclusion.  Please see intraoperative photos at the bottom  Estimated Blood Loss:  Minimal         Drains: none         Specimens: appendix         Complications:  None; patient tolerated the procedure well.         Disposition: PACU - hemodynamically stable.         Condition: stable                [name]. Angel Pulling, MD, FACS General, Bariatric, & Minimally Invasive Surgery [facility], [place]

## 2018-05-03 NOTE — Anesthesia Procedure Notes (Signed)
Procedure Name: Intubation Date/Time: 05/03/2018 2:33 PM Performed by: Marsa Aris, CRNA Pre-anesthesia Checklist: Patient identified, Emergency Drugs available, Suction available and Patient being monitored Patient Re-evaluated:Patient Re-evaluated prior to induction Oxygen Delivery Method: Circle System Utilized Preoxygenation: Pre-oxygenation with 100% oxygen Induction Type: IV induction, Rapid sequence and Cricoid Pressure applied Laryngoscope Size: Miller and 2 Grade View: Grade I Tube type: Oral Tube size: 7.5 mm Number of attempts: 1 Airway Equipment and Method: Stylet Placement Confirmation: ETT inserted through vocal cords under direct vision,  positive ETCO2 and breath sounds checked- equal and bilateral Secured at: 22 cm Tube secured with: Tape Dental Injury: Teeth and Oropharynx as per pre-operative assessment

## 2018-05-03 NOTE — H&P (Signed)
[name] is an 37 y.o. female.   Chief Complaint: Abdominal pain PCP:  Alycia Rossetti, MD  HPI: Patient is a 37 year old female presented to the ED today.  She states she has been having abdominal pain since last Thursday 7/11.  She was seen at [facility] last week on 04/30/2018.  In addition to pain she was complaining of an abnormal menstruation.  A transvaginal ultrasound to check her ovaries were reported negative;  last BM was on 04/28/2018.  She reports pain with bearing down.  She has had 2 menstrual cycles in the last month.  She has tried ibuprofen at home,she takes hydrocodone twice daily for chronic back pain at home also.    She tried mag citrate and prune juice over the weekend to help with concerns of constipation.  None of these measures improved her symptoms.  She was tolerating some p.o.'s but quit eating because of pain with bearing down for a bowel movement or trying to void.  She presented to the ED here today.  Work-up in the ED shows she is afebrile blood pressure is okay.  Labs shows a normal white count of 9.5, hemoglobin 14.2, hematocrit 44.6, platelets 221,000.  CMP is normal.  Lipase is 27.  Urinalysis is unremarkable.  hCG was negative on 7/12.  CT this a.m. shows the appendix is thickened wall measuring 9 mm with slight appendiceal wall enhancement.  No free air or abscess findings were consistent with acute appendicitis. The uterus is anteverted fluid tracks from the right ovarian to the cul-de-sac a question of recent ovarian cyst rupture versus sympathetic response from a nearby acute inflammatory appendix.  We are asked to see.  Transvaginal ultrasound findings from 04/30/2018: No acute findings to account for right lower quadrant pain specifically no ovarian torsion, small echogenic foci of the endometrium these are nonspecific they may reflect small foci of air or calcification normal ovaries and adnexa.  Past Medical History:  Diagnosis Date  . 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 Bipolar 1 disorder, mixed ([place])   . Bipolar disorder, mixed ([place])   . Depression   . Hair pulling   . HSIL on Pap smear of cervix 2013   [place]    Past Surgical History:  Procedure Laterality Date  . CHOLECYSTECTOMY    . TUBAL LIGATION      Family History  Problem Relation Age of Onset  . Cancer Mother        lung  . Heart disease Mother   . Alcohol abuse Mother   . Bipolar disorder Mother   . Cancer Father        prostate    Social History:  reports that she has been smoking cigarettes.  She has been smoking about 0.50 packs per day. She has never used smokeless tobacco. She reports that she drinks alcohol. She reports that she does not use drugs. She is married, her husband is with her.   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 She has 3 daughters ages 36, 60, and 59 Tobacco: Half pack per day for 15 years. EtOH: None Drugs: None     Allergies  Allergen Reactions  . Lamictal [Lamotrigine]     Knots in throat and back of head  . Prozac [Fluoxetine Hcl] Other (See Comments)    Blurry vision    Prior to Admission medications   Medication Sig  Start Date End Date Taking? Authorizing Provider  amphetamine-dextroamphetamine (ADDERALL) 15 MG tablet Take 15 mg by mouth daily.    [provider]  busPIRone (BUSPAR) 10 MG tablet Take 1 tablet by mouth daily. 03/26/18  not taking  [provider]  cyclobenzaprine (FLEXERIL) 10 MG tablet Take 1 tablet (10 mg total) by mouth 3 (three) times daily as needed for muscle spasms. Patient not taking: Reported on 04/30/2018 04/08/18  she is not using this  Delsa Grana, PA-C  gabapentin (NEURONTIN) 300 MG capsule Take 300 mg by mouth at bedtime as needed (Leg pain).     She takes this on a as needed basis  [provider]  HYDROcodone-acetaminophen (NORCO) 5-325 MG tablet Take 1 tablet by mouth 3 (three) times daily as needed for severe  pain. Patient taking differently: Take 1 tablet by mouth at bedtime.  04/08/18  she takes this for back pain on average twice daily  Delsa Grana, PA-C  ibuprofen (ADVIL,MOTRIN) 200 MG tablet Take 400 mg by mouth daily as needed for moderate pain.    She takes this as needed  [provider]  metroNIDAZOLE (FLAGYL) 500 MG tablet Take 1 tablet (500 mg total) by mouth 2 (two) times daily. Patient not taking: Reported on 04/30/2018 02/19/18  not taking  [place], Modena Nunnery, MD  predniSONE (DELTASONE) 20 MG tablet Take 2 tablets (40 mg) PO daily x 5 days, then take 1 tab PO daily x 5 days Patient not taking: Reported on 04/30/2018 04/08/18  not taking  Delsa Grana, PA-C  triamcinolone (KENALOG) 0.025 % ointment Apply 1 application topically 2 (two) times daily. 03/20/18  not taking  Evelina Dun A, FNP  venlafaxine (EFFEXOR) 37.5 MG tablet Take 37.5 mg by mouth daily.    Uses this daily.  [provider]     Results for orders placed or performed during the hospital encounter of 05/03/18 (from the past 48 hour(s))  Comprehensive metabolic panel     Status: None   Collection Time: 05/03/18  8:02 AM  Result Value Ref Range   Sodium 140 135 - 145 mmol/L   Potassium 4.0 3.5 - 5.1 mmol/L   Chloride 104 98 - 111 mmol/L    Comment: Please note change in reference range.   CO2 25 22 - 32 mmol/L   Glucose, Bld 93 70 - 99 mg/dL    Comment: Please note change in reference range.   BUN 11 6 - 20 mg/dL    Comment: Please note change in reference range.   Creatinine, Ser 0.76 0.44 - 1.00 mg/dL   Calcium 9.2 8.9 - 10.3 mg/dL   Total Protein 7.4 6.5 - 8.1 g/dL   Albumin 3.6 3.5 - 5.0 g/dL   AST 23 15 - 41 U/L   ALT 32 0 - 44 U/L    Comment: Please note change in reference range.   Alkaline Phosphatase 101 38 - 126 U/L   Total Bilirubin 0.8 0.3 - 1.2 mg/dL   GFR calc non Af Amer >60 >60 mL/min   GFR calc Af Amer >60 >60 mL/min    Comment: (NOTE) The eGFR has been calculated using the CKD  EPI equation. This calculation has not been validated in all clinical situations. eGFR's persistently <60 mL/min signify possible Chronic Kidney Disease.    Anion gap 11 5 - 15    Comment: Performed at [facility] [address] [place], [place] [address]  Lipase, blood  Status: None   Collection Time: 05/03/18  8:02 AM  Result Value Ref Range   Lipase 27 11 - 51 U/L    Comment: Performed at [facility], [place] [address] [place], [place] [address]  CBC with Differential     Status: Abnormal   Collection Time: 05/03/18  8:02 AM  Result Value Ref Range   WBC 9.5 4.0 - 10.5 K/uL   RBC 5.34 (H) 3.87 - 5.11 MIL/uL   Hemoglobin 14.2 12.0 - 15.0 g/dL   HCT 44.6 36.0 - 46.0 %   MCV 83.5 78.0 - 100.0 fL   MCH 26.6 26.0 - 34.0 pg   MCHC 31.8 30.0 - 36.0 g/dL   RDW 13.6 11.5 - 15.5 %   Platelets 221 150 - 400 K/uL   Neutrophils Relative % 70 %   Neutro Abs 6.6 1.7 - 7.7 K/uL   Lymphocytes Relative 18 %   Lymphs Abs 1.7 0.7 - 4.0 K/uL   Monocytes Relative 10 %   Monocytes Absolute 1.0 0.1 - 1.0 K/uL   Eosinophils Relative 2 %   Eosinophils Absolute 0.2 0.0 - 0.7 K/uL   Basophils Relative 0 %   Basophils Absolute 0.0 0.0 - 0.1 K/uL   Immature Granulocytes 0 %   Abs Immature Granulocytes 0.0 0.0 - 0.1 K/uL    Comment: Performed at [facility] [address], [place], [place] [address]  I-Stat beta hCG blood, ED     Status: None   Collection Time: 05/03/18  8:08 AM  Result Value Ref Range   I-stat hCG, quantitative <5.0 <5 mIU/mL   Comment 3            Comment:   GEST. AGE      CONC.  (mIU/mL)   <=1 WEEK        5 - 50     2 WEEKS       50 - 500     3 WEEKS       100 - 10,000     4 WEEKS     1,000 - 30,000        FEMALE AND NON-PREGNANT FEMALE:     LESS THAN 5 mIU/mL   I-Stat CG4 Lactic Acid, ED     Status: None   Collection Time: 05/03/18  8:11 AM  Result Value Ref Range   Lactic Acid, Venous 0.70 0.5 - 1.9 mmol/L  Urinalysis, Routine w reflex  microscopic     Status: Abnormal   Collection Time: 05/03/18 10:14 AM  Result Value Ref Range   Color, Urine YELLOW YELLOW   APPearance CLEAR CLEAR   Specific Gravity, Urine 1.006 1.005 - 1.030   pH 6.0 5.0 - 8.0   Glucose, UA NEGATIVE NEGATIVE mg/dL   Hgb urine dipstick NEGATIVE NEGATIVE   Bilirubin Urine NEGATIVE NEGATIVE   Ketones, ur 5 (A) NEGATIVE mg/dL   Protein, ur NEGATIVE NEGATIVE mg/dL   Nitrite NEGATIVE NEGATIVE   Leukocytes, UA NEGATIVE NEGATIVE    Comment: Performed at [facility], [place] [address], [place], [place] [address]   Ct Abdomen Pelvis W Contrast  Result Date: 05/03/2018 CLINICAL DATA:  Right lower quadrant pain EXAM: CT ABDOMEN AND PELVIS WITH CONTRAST TECHNIQUE: Multidetector CT imaging of the abdomen and pelvis was performed using the standard protocol following bolus administration of intravenous contrast. Oral contrast was also administered. CONTRAST:  100 mL Omnipaque 300 nonionic COMPARISON:  May 24, 2015 CT abdomen and pelvis.  Pelvic ultrasound April 30, 2018 FINDINGS: Lower chest: Lung bases are clear. Hepatobiliary: There is fatty infiltration near the fissure for the ligamentum teres. No focal liver lesions are evident. Gallbladder is absent. There is no appreciable biliary duct dilatation. Pancreas: No pancreatic mass or inflammatory focus. Spleen: Spleen appears upper normal in size without focal splenic lesion evident. Adrenals/Urinary Tract: Adrenals bilaterally appear unremarkable. Kidneys bilaterally show no evident mass or hydronephrosis on either side. There is no evident renal or ureteral calculus on either side. Urinary bladder is midline with wall thickness within normal limits. Stomach/Bowel: There is moderate stool in the colon. There is no appreciable bowel wall or mesenteric thickening. No bowel obstruction evident. No free air or portal venous air. Vascular/Lymphatic: There is no appreciable abdominal aortic aneurysm. No vascular lesions are  evident. There is no appreciable adenopathy in the abdomen or pelvis. Reproductive: Uterus is anteverted. There is no well-defined pelvic mass. Fluid tracks from the right ovary into the cul-de-sac. Note that there is evidence of inflammation immediately adjacent to the right ovary, felt to be due to appendiceal inflammation. Other: The appendix demonstrates a thickened wall measuring 9 mm with slight appendiceal wall enhancement. The appendix becomes somewhat more globular distally adjacent to the right ovary. There is no free air or frank abscess in this area of apparent acute appendiceal inflammation. There is no ascites beyond mild fluid in the cul-de-sac. No frank abscess is evident in the abdomen or pelvis. There is a minimal ventral hernia containing only fat. Musculoskeletal: There are no blastic or lytic bone lesions. No intramuscular or abdominal wall lesions are evident. IMPRESSION: 1.  Findings indicative of acute appendiceal inflammation. Appendix: Location: Mid pelvis slightly to the right of midline arising from the posterior cecum and extending posteriorly and slightly inferiorly. Diameter: 9 mm, becoming more globular distally. Appendicolith: None Mucosal hyper-enhancement: Mild Extraluminal gas: None Periappendiceal collection: Mild stranding but no well-defined fluid collection. Note that apparent inflammation in the distal appendix is near the right ovary. 2. Fluid tracks from the right ovary into the cul-de-sac. Question recent ovarian cyst rupture versus sympathetic response from nearby acute inflammatory change of the appendix. 3.  No abscess evident in the abdomen pelvis.  No bowel obstruction. 4. No renal or ureteral calculus. No hydronephrosis on either side. 5.  Minimal ventral hernia containing only fat. Critical Value/emergent results were called by telephone at the time of interpretation on 05/03/2018 at 11:31 am to [facility], PA, who verbally acknowledged these results.  Electronically Signed   By: Lowella Grip III M.D.   On: 05/03/2018 11:31    Review of Systems  Constitutional: Positive for fever (Low-grade fever 99.8, last p.m.). Negative for chills, diaphoresis, malaise/fatigue and weight loss.  HENT: Negative.        She had a root canal on Friday, 04/30/2018  Eyes: Negative.   Respiratory: Negative.   Cardiovascular: Negative.   Gastrointestinal: Positive for abdominal pain (Pain has been primarily in the right lower quadrant although she had some left-sided pain earlier in the week.  Currently the pain is all in the right lower quadrant.), constipation (Last BM 04/28/2018) and nausea. Negative for blood in stool, diarrhea, melena and vomiting (She did show and was vomited with contrast media.).       She has not been eating or drinking much because it hurts to void and it hurts to bear down to have a bowel movement.  Genitourinary: Positive for dysuria (She has pain with voiding.).  Musculoskeletal:  Positive for back pain (She has chronic back pain she had back surgery last year in August and takes hydrocodone twice daily for ongoing back pain). Negative for falls, joint pain, myalgias and neck pain.  Skin: Negative.   Neurological: Negative.   Endo/Heme/Allergies: Negative.   Psychiatric/Behavioral: Positive for depression.       She has a history of bipolar disorder.    Blood pressure 107/75, pulse 81, temperature 99.4 F (37.4 C), temperature source Oral, resp. rate 17, last menstrual period 05/02/2018, SpO2 100 %. Physical Exam  Constitutional: She is oriented to person, place, and time. She appears well-developed and well-nourished.  She is having some ongoing pain in the right lower quadrant.  HENT:  Head: Normocephalic and atraumatic.  Mouth/Throat: Oropharynx is clear and moist. No oropharyngeal exudate.  Eyes: Right eye exhibits no discharge. Left eye exhibits no discharge. No scleral icterus.  Pupils are equal  Neck: Normal range of  motion. Neck supple. No JVD present. No tracheal deviation present. No thyromegaly present.  Cardiovascular: Normal rate, regular rhythm, normal heart sounds and intact distal pulses.  No murmur heard. Respiratory: Effort normal and breath sounds normal. No respiratory distress. She has no wheezes. She has no rales. She exhibits no tenderness.  GI: Soft. She exhibits no distension and no mass. There is tenderness (Primarily the right lower quadrant.). There is no rebound and no guarding.  Musculoskeletal: She exhibits no edema or tenderness.  Lymphadenopathy:    She has no cervical adenopathy.  Neurological: She is alert and oriented to person, place, and time. No cranial nerve deficit.  Skin: Skin is warm. No rash noted. She is not diaphoretic. No erythema. No pallor.  Psychiatric: She has a normal mood and affect. Her behavior is normal. Judgment and thought content normal.     Assessment/Plan Acute appendicitis Chronic back pain with disc surgery 06/19/2017 Dr. Trenton Gammon Bipolar disorder/depression -on Adderall and Effexor  Plan: Admit the patient hydrate her well.  She has been started on Rocephin and Flagyl in the ED.  Scheduled for laparoscopic appendectomy today by Dr. Redmond Pulling.     Ogden Handlin, PA-C 05/03/2018, 12:09 PM

## 2018-05-03 NOTE — Transfer of Care (Signed)
Immediate Anesthesia Transfer of Care Note  Patient: Nelva Bush Totman  Procedure(s) Performed: APPENDECTOMY LAPAROSCOPIC (N/A Abdomen)  Patient Location: PACU  Anesthesia Type:General  Level of Consciousness: awake, alert  and oriented  Airway & Oxygen Therapy: Patient Spontanous Breathing  Post-op Assessment: Report given to RN and Post -op Vital signs reviewed and stable  Post vital signs: Reviewed and stable  Last Vitals:  Vitals Value Taken Time  BP 92/56 05/03/2018  3:54 PM  Temp    Pulse 88 05/03/2018  3:56 PM  Resp    SpO2 100 % 05/03/2018  3:56 PM  Vitals shown include unvalidated device data.  Last Pain:  Vitals:   05/03/18 1239  TempSrc:   PainSc: 0-No pain         Complications: No apparent anesthesia complications

## 2018-05-03 NOTE — Plan of Care (Signed)
  Problem: Clinical Measurements: Goal: Ability to maintain clinical measurements within normal limits will improve Outcome: Progressing   Problem: Nutrition: Goal: Adequate nutrition will be maintained Outcome: Progressing   

## 2018-05-03 NOTE — ED Triage Notes (Addendum)
Pt states RLQ abdominal pain since Thursday, states she went to [facility] and had ultrasound to check ovaries that was negative. Pt states the pain is worse when she tries to have a BM. Last normal BM was Wednesday. Pt also feels pain with urination. States "any type of bearing down feeling gives me pain." Tearful at triage. Pt has had 2 menstrual cycles this month, last one ending yesterday.

## 2018-05-03 NOTE — Progress Notes (Signed)
Patient arrived to unit stating she is very hungry. Clear liquids given with no pain or nausea. Will trial a soft diet, patient aware she needs to slowly progress to solid foods.

## 2018-05-03 NOTE — Anesthesia Postprocedure Evaluation (Signed)
Anesthesia Post Note  Patient: Nelva Bush Wickard  Procedure(s) Performed: APPENDECTOMY LAPAROSCOPIC (N/A Abdomen)     Patient location during evaluation: PACU Anesthesia Type: General Level of consciousness: awake and alert, oriented and patient cooperative Pain management: pain level controlled Vital Signs Assessment: post-procedure vital signs reviewed and stable Respiratory status: spontaneous breathing, nonlabored ventilation and respiratory function stable Cardiovascular status: blood pressure returned to baseline and stable Postop Assessment: no apparent nausea or vomiting Anesthetic complications: no    Last Vitals:  Vitals:   05/03/18 1715 05/03/18 1735  BP:  98/70  Pulse:  92  Resp:    Temp: 36.5 C 37.1 C  SpO2:  100%    Last Pain:  Vitals:   05/03/18 1735  TempSrc: Oral  PainSc:                  Vinod Mikesell,E. Sorren Vallier

## 2018-05-04 ENCOUNTER — Telehealth: Payer: Self-pay | Admitting: General Practice

## 2018-05-04 ENCOUNTER — Encounter (HOSPITAL_COMMUNITY): Payer: Self-pay | Admitting: General Surgery

## 2018-05-04 DIAGNOSIS — K388 Other specified diseases of appendix: Secondary | ICD-10-CM | POA: Diagnosis not present

## 2018-05-04 DIAGNOSIS — F17210 Nicotine dependence, cigarettes, uncomplicated: Secondary | ICD-10-CM | POA: Diagnosis not present

## 2018-05-04 DIAGNOSIS — Z79899 Other long term (current) drug therapy: Secondary | ICD-10-CM | POA: Diagnosis not present

## 2018-05-04 DIAGNOSIS — G8929 Other chronic pain: Secondary | ICD-10-CM | POA: Diagnosis not present

## 2018-05-04 DIAGNOSIS — F988 Other specified behavioral and emotional disorders with onset usually occurring in childhood and adolescence: Secondary | ICD-10-CM | POA: Diagnosis not present

## 2018-05-04 DIAGNOSIS — K3580 Unspecified acute appendicitis: Secondary | ICD-10-CM | POA: Diagnosis not present

## 2018-05-04 DIAGNOSIS — F411 Generalized anxiety disorder: Secondary | ICD-10-CM | POA: Diagnosis not present

## 2018-05-04 DIAGNOSIS — N7093 Salpingitis and oophoritis, unspecified: Secondary | ICD-10-CM | POA: Diagnosis not present

## 2018-05-04 DIAGNOSIS — K432 Incisional hernia without obstruction or gangrene: Secondary | ICD-10-CM | POA: Diagnosis not present

## 2018-05-04 MED ORDER — ONDANSETRON 4 MG PO TBDP: 4.0000 mg | ORAL_TABLET | Freq: Four times a day (QID) | ORAL | 0 refills | Status: DC | PRN

## 2018-05-04 MED ORDER — ENOXAPARIN SODIUM 40 MG/0.4ML ~~LOC~~ SOLN: 40.0000 mg | SUBCUTANEOUS | Status: DC

## 2018-05-04 MED ORDER — SENNA 8.6 MG PO TABS: 1.0000 | ORAL_TABLET | Freq: Every evening | ORAL | Status: DC | PRN

## 2018-05-04 MED ORDER — DOCUSATE SODIUM 100 MG PO CAPS
200.0000 mg | ORAL_CAPSULE | Freq: Every day | ORAL | Status: DC
Start: 2018-05-04 — End: 2018-05-04

## 2018-05-04 MED ORDER — DOXYCYCLINE HYCLATE 100 MG PO TABS: 100.0000 mg | ORAL_TABLET | Freq: Two times a day (BID) | ORAL | 0 refills | Status: AC

## 2018-05-04 NOTE — Plan of Care (Signed)
  Problem: Pain Managment: Goal: General experience of comfort will improve Outcome: Progressing   

## 2018-05-04 NOTE — Progress Notes (Addendum)
1 Day Post-Op    CC: Right lower quadrant abdominal pain  Subjective: She is sore but feeling better this a.m.  Sites look fine.  She is taking some breakfast now.  She had some nausea last p.m.  Dr. Redmond Pulling showed her pictures and explained the findings to her himself this a.m.  Objective: Vital signs in last 24 hours: Temp:  [97.7 F (36.5 C)-98.8 F (37.1 C)] 98.4 F (36.9 C) (07/16 0621) Pulse Rate:  [72-97] 78 (07/16 0621) Resp:  [16-18] 18 (07/16 0621) BP: (82-107)/(55-75) 91/59 (07/16 0621) SpO2:  [97 %-100 %] 99 % (07/16 0621) Weight:  [68 kg (150 lb)] 68 kg (150 lb) (07/15 1325) Last BM Date: 05/02/18 250 PO 3500 IV Urine 50 recorded Afebrile, VSS BP on low side No labs Intake/Output from previous day: 07/15 0701 - 07/16 0700 In: 3900.8 [P.O.:250; I.V.:1300.2; IV Piggyback:2150.6] Out: 55 [Urine:50; Blood:5] Intake/Output this shift: No intake/output data recorded.  General appearance: alert, cooperative and no distress Resp: clear to auscultation bilaterally GI: Soft but sore and tender dressings are dry over port sites.  Lab Results:  Recent Labs    05/03/18 0802  WBC 9.5  HGB 14.2  HCT 44.6  PLT 221    BMET Recent Labs    05/03/18 0802  NA 140  K 4.0  CL 104  CO2 25  GLUCOSE 93  BUN 11  CREATININE 0.76  CALCIUM 9.2   PT/INR No results for input(s): LABPROT, INR in the last 72 hours.  Recent Labs  Lab 05/03/18 0802  AST 23  ALT 32  ALKPHOS 101  BILITOT 0.8  PROT 7.4  ALBUMIN 3.6     Lipase     Component Value Date/Time   LIPASE 27 05/03/2018 0802     Medications: . acetaminophen  1,000 mg Oral Q6H  . doxycycline  100 mg Oral Q12H  . scopolamine  1 patch Transdermal Q72H  . venlafaxine XR  37.5 mg Oral QHS   . sodium chloride Stopped (05/03/18 1847)  . cefOXitin 2 g (05/04/18 6283)   Anti-infectives (From admission, onward)   Start     Dose/Rate Route Frequency Ordered Stop   05/03/18 2200  doxycycline (VIBRA-TABS)  tablet 100 mg     100 mg Oral Every 12 hours 05/03/18 1733     05/03/18 1800  cefOXitin (MEFOXIN) 2 g in sodium chloride 0.9 % 100 mL IVPB     2 g 200 mL/hr over 30 Minutes Intravenous Every 6 hours 05/03/18 1733     05/03/18 1300  cefTRIAXone (ROCEPHIN) 2 g in sodium chloride 0.9 % 100 mL IVPB  Status:  Discontinued    Note to Pharmacy:  If she has had this in the ED you do not have to repeat it.   2 g 200 mL/hr over 30 Minutes Intravenous Every 24 hours 05/03/18 1247 05/03/18 1733   05/03/18 1300  metroNIDAZOLE (FLAGYL) IVPB 500 mg  Status:  Discontinued    Note to Pharmacy:  She has a dose of pain in the ED just continue until we do postop orders   500 mg 100 mL/hr over 60 Minutes Intravenous Every 8 hours 05/03/18 1247 05/03/18 1733   05/03/18 1145  cefTRIAXone (ROCEPHIN) 2 g in sodium chloride 0.9 % 100 mL IVPB     2 g 200 mL/hr over 30 Minutes Intravenous  Once 05/03/18 1132 05/03/18 1220   05/03/18 1145  metroNIDAZOLE (FLAGYL) IVPB 500 mg     500 mg 100 mL/hr  over 60 Minutes Intravenous  Once 05/03/18 1132 05/03/18 1248      Assessment/Plan Chronic back pain with disc surgery 06/19/2017 Dr. Trenton Gammon Bipolar disorder -on Adderall and Effexor  Appendicitis and right tubo-ovarian abscess/pyosalpinx Laparoscopic appendectomy 05/03/2018, Dr. Greer Pickerel  GYN Consult:  Dr. Rosana Hoes - pending  FEN: Regular diet ID: Flagyl/Rocephin preop; cefoxitin 2 g IV every 6, 7/15 =day 2 doxycycline 100 mg p.o. twice daily x14 days DVT: SCDs , add Lovenox tonight  Plan: I will add Lovenox later this evening.  We are awaiting GYN consult and their recommendations for antibiotics.  As noted above we currently have her on cefoxitin 2 g IV every 6 and started her on doxycycline 100 mg p.o. twice daily.  From our standpoint she can go home when she is tolerating diet with just oral pain medications. I have personally reviewed the patients medication history on the [place] controlled substance database.  LOS: 0  days    Minard Millirons 05/04/2018 [id number]

## 2018-05-04 NOTE — Telephone Encounter (Signed)
Called and notified patient of ED f/u appt with Dr. Rosana Hoes on 05/12/18 at 10:35am.  Patient voiced understanding.

## 2018-05-04 NOTE — Discharge Summary (Signed)
Physician Discharge Summary  Patient ID: [name] MRN: [id number] DOB/AGE: Sep 19, 1981 36 y.o.  Admit date: 05/03/2018 Discharge date: 05/04/2018  Admission Diagnoses:  Acute appendicitis Chronic back pain with disc surgery 06/19/17 Dr. Trenton Gammon Bipolar disorder -on Adderall and Effexor  Discharge Diagnoses:  right tubo-ovarian abscess/pyosalpinx with secondary tip appendicitis Chronic back pain Bipolar disorder   Active Problems:   Acute appendicitis  Consults Dr. Shelby Mattocks -OB/GYN PROCEDURES: Laparoscopic appendectomy, 05/03/2018, Dr. Greer Pickerel  Reason for Admission:   Patient is a 37 year old female presented to the ED today.  She states she has been having abdominal pain since last Thursday 7/11.  She was seen at [facility] last week on 04/30/2018.  In addition to pain she was complaining of an abnormal menstruation.  A transvaginal ultrasound to check her ovaries were reported negative;  last BM was on 04/28/2018.  She reports pain with bearing down.  She has had 2 menstrual cycles in the last month.  She has tried ibuprofen at home,she takes hydrocodone twice daily for chronic back pain at home also.    She tried mag citrate and prune juice over the weekend to help with concerns of constipation.  None of these measures improved her symptoms.  She was tolerating some p.o.'s but quit eating because of pain with bearing down for a bowel movement or trying to void.  She presented to the ED here today.  Work-up in the ED shows she is afebrile blood pressure is okay.  Labs shows a normal white count of 9.5, hemoglobin 14.2, hematocrit 44.6, platelets 221,000.  CMP is normal.  Lipase is 27.  Urinalysis is unremarkable.  hCG was negative on 7/12.  CT this a.m. shows the appendix is thickened wall measuring 9 mm with slight appendiceal wall enhancement.  No free air or abscess findings were consistent with acute appendicitis. The uterus is anteverted fluid tracks from the right  ovarian to the cul-de-sac a question of recent ovarian cyst rupture versus sympathetic response from a nearby acute inflammatory appendix.  We are asked to see.  Transvaginal ultrasound findings from 04/30/2018: No acute findings to account for right lower quadrant pain specifically no ovarian torsion, small echogenic foci of the endometrium these are nonspecific they may reflect small foci of air or calcification normal ovaries and adnexa.  Hospital course: Patient was admitted taken to the operating room for laparoscopic appendectomy.  During the procedure the appendix tip was found to be inflamed.  There were no signs of necrosis.  There was no perforation.  There was no abscess formation.  The right fallopian tube was indurated and thickened and draining purulent material.  Was Dr. Dois Davenport opinion that this was a right tubo-ovarian abscess with pyosalpinx.  An intraoperative consult with Dr. Rosana Hoes was obtained.  It was her opinion that we should treat this medically as she did not recommend a surgical drain or removal of the tube without further discussion with the patient.  Postop she is sore but doing well on the first postoperative day.  We are keeping her on IV cefoxitin, 2 g IV every 6h, and doxycycline 100 mg p.o. twice daily.  Dr. Nehemiah Settle with GYN was called to check on consult and apparently someone attempted to come see the patient this morning, but was "unable to locate the patient."  When I spoke with Dr. Nehemiah Settle this afternoon, he reviewed her chart, vitals, and labs, and felt the patient appeared to be stable and that she just needed outpatient follow  up and could otherwise be discharged.  He recommended just doxy 100mg  BID for a total of 14 days.  I discussed this with the patient and answered as many of their questions about this situation as  I could.  The patient is surgically doing well and eager to go home.  She will be discharged with GYN follow up as well as follow up in our office.     Disposition: Discharge disposition: 01-Home or Self Care        Allergies as of 05/04/2018      Reactions   Lamictal [lamotrigine]    Knots in throat and back of head   Prozac [fluoxetine Hcl] Other (See Comments)   Blurry vision      Medication List    TAKE these medications   amphetamine-dextroamphetamine 15 MG tablet Commonly known as:  ADDERALL Take 15 mg by mouth daily.   cyclobenzaprine 10 MG tablet Commonly known as:  FLEXERIL Take 1 tablet (10 mg total) by mouth 3 (three) times daily as needed for muscle spasms.   doxycycline 100 MG tablet Commonly known as:  VIBRA-TABS Take 1 tablet (100 mg total) by mouth every 12 (twelve) hours for 13 days.   HYDROcodone-acetaminophen 5-325 MG tablet Commonly known as:  NORCO Take 1 tablet by mouth 3 (three) times daily as needed for severe pain. What changed:  when to take this   ondansetron 4 MG disintegrating tablet Commonly known as:  ZOFRAN-ODT Take 1 tablet (4 mg total) by mouth every 6 (six) hours as needed for nausea.   triamcinolone 0.025 % ointment Commonly known as:  KENALOG Apply 1 application topically 2 (two) times daily.   venlafaxine 37.5 MG tablet Commonly known as:  EFFEXOR Take 37.5 mg by mouth daily.      Follow-up Information    [facility] Follow up on 05/20/2018.   Specialty:  General Surgery Why:  Appointment is at 2 PM.  Be at the office 30 minutes early for check-in.  Bring photo ID and insurance information. Contact information: [place] [address] [phone]        Emily Filbert, MD Follow up on 05/12/2018.   Specialty:  Obstetrics and Gynecology Contact information: [place] [address] [phone]           Signed: Henreitta Cea 05/04/2018, 4:24 PM

## 2018-05-04 NOTE — Progress Notes (Signed)
Per pt, no BM since 04/29/2018.  Call placed via Amion to notify primary team.  Awaiting return call/ new orders.  [id number]

## 2018-05-04 NOTE — Discharge Instructions (Signed)
[facility] ______[facility] °LAPAROSCOPIC SURGERY: POST OP INSTRUCTIONS °Always review your discharge instruction sheet given to you by the facility where your surgery was performed. °IF YOU HAVE DISABILITY OR FAMILY LEAVE FORMS, YOU MUST BRING THEM TO THE OFFICE FOR PROCESSING.   °DO NOT GIVE THEM TO YOUR DOCTOR. ° °1. A prescription for pain medication may be given to you upon discharge.  Take your pain medication as prescribed, if needed.  If narcotic pain medicine is not needed, then you may take acetaminophen (Tylenol) or ibuprofen (Advil) as needed. °2. Take your usually prescribed medications unless otherwise directed. °3. If you need a refill on your pain medication, please contact your pharmacy.  They will contact our office to request authorization. Prescriptions will not be filled after 5pm or on week-ends. °4. You should follow a light diet the first few days after arrival home, such as soup and crackers, etc.  Be sure to include lots of fluids daily. °5. Most patients will experience some swelling and bruising in the area of the incisions.  Ice packs will help.  Swelling and bruising can take several days to resolve.  °6. It is common to experience some constipation if taking pain medication after surgery.  Increasing fluid intake and taking a stool softener (such as Colace) will usually help or prevent this problem from occurring.  A mild laxative (Milk of Magnesia or Miralax) should be taken according to package instructions if there are no bowel movements after 48 hours. °7. Unless discharge instructions indicate otherwise, you may remove your bandages 24-48 hours after surgery, and you may shower at that time.  You may have steri-strips (small skin tapes) in place directly over the incision.  These strips should be left on the skin for 7-10 days.  If your surgeon used skin glue on the incision, you may shower in 24 hours.  The glue will flake off over the next 2-3 weeks.  Any sutures or  staples will be removed at the office during your follow-up visit. °8. ACTIVITIES:  You may resume regular (light) daily activities beginning the next day--such as daily self-care, walking, climbing stairs--gradually increasing activities as tolerated.  You may have sexual intercourse when it is comfortable.  Refrain from any heavy lifting or straining until approved by your doctor. °a. You may drive when you are no longer taking prescription pain medication, you can comfortably wear a seatbelt, and you can safely maneuver your car and apply brakes. °b. RETURN TO WORK:  __________________________________________________________ °9. You should see your doctor in the office for a follow-up appointment approximately 2-3 weeks after your surgery.  Make sure that you call for this appointment within a day or two after you arrive home to insure a convenient appointment time. °10. OTHER INSTRUCTIONS: __________________________________________________________________________________________________________________________ __________________________________________________________________________________________________________________________ °WHEN TO CALL YOUR DOCTOR: °1. Fever over 101.0 °2. Inability to urinate °3. Continued bleeding from incision. °4. Increased pain, redness, or drainage from the incision. °5. Increasing abdominal pain ° °The clinic staff is available to answer your questions during regular business hours.  Please don’t hesitate to call and ask to speak to one of the nurses for clinical concerns.  If you have a medical emergency, go to the nearest emergency room or call 911.  A surgeon from [facility] is always on call at the hospital. °[address], [address], [place], [place]  [address] ? [address] [address], [place],    [address] °[phone] ? [phone] ? FAX [phone] °Web site:   [email or web address]  Pelvic Inflammatory Disease Your disease was on the right  side Pelvic inflammatory disease (PID) is an infection in some or all of the female organs. PID can be in the uterus, ovaries, fallopian tubes, or the surrounding tissues that are inside the lower belly area (pelvis). PID can lead to lasting problems if it is not treated. To check for this disease, your doctor may:  Do a physical exam.  Do blood tests, urine tests, or a pregnancy test.  Look at your vaginal discharge.  Do tests to look inside the pelvis.  Test you for other infections.  Follow these instructions at home:  Take over-the-counter and prescription medicines only as told by your doctor.  If you were prescribed an antibiotic medicine, take it as told by your doctor. Do not stop taking it even if you start to feel better.  Do not have sex until treatment is done or as told by your doctor.  Tell your sex partner if you have PID. Your partner may need to be treated.  Keep all follow-up visits as told by your doctor. This is important.  Your doctor may test you for infection again 3 months after you are treated. Contact a doctor if:  You have more fluid (discharge) coming from your vagina or fluid that is not normal.  Your pain does not improve.  You throw up (vomit).  You have a fever.  You cannot take your medicines.  Your partner has a sexually transmitted disease (STD).  You have pain when you pee (urinate). Get help right away if:  You have more belly (abdominal) or lower belly pain.  You have chills.  You are not better after 72 hours. This information is not intended to replace advice given to you by your health care provider. Make sure you discuss any questions you have with your health care provider. Document Released: 01/02/2009 Document Revised: 03/13/2016 Document Reviewed: 11/13/2014 Elsevier Interactive Patient Education  [employer].

## 2018-05-12 ENCOUNTER — Encounter: Payer: Self-pay | Admitting: Obstetrics and Gynecology

## 2018-05-12 ENCOUNTER — Ambulatory Visit (INDEPENDENT_AMBULATORY_CARE_PROVIDER_SITE_OTHER): Payer: 59 | Admitting: Obstetrics and Gynecology

## 2018-05-12 VITALS — BP 111/83 | HR 122 | Ht 64.0 in | Wt 149.4 lb

## 2018-05-12 DIAGNOSIS — K5900 Constipation, unspecified: Secondary | ICD-10-CM

## 2018-05-12 DIAGNOSIS — Z9889 Other specified postprocedural states: Secondary | ICD-10-CM | POA: Diagnosis not present

## 2018-05-12 DIAGNOSIS — N7093 Salpingitis and oophoritis, unspecified: Secondary | ICD-10-CM | POA: Diagnosis not present

## 2018-05-12 MED ORDER — ONDANSETRON 4 MG PO TBDP: 4.0000 mg | ORAL_TABLET | Freq: Four times a day (QID) | ORAL | 0 refills | Status: DC | PRN

## 2018-05-12 MED ORDER — METRONIDAZOLE 500 MG PO TABS: 500.0000 mg | ORAL_TABLET | Freq: Two times a day (BID) | ORAL | 0 refills | Status: AC

## 2018-05-12 NOTE — Progress Notes (Signed)
Pt states has been feeling constipated since surgery and also states right above belly button on right side, she feels like a bubble or something moving around.

## 2018-05-12 NOTE — Patient Instructions (Signed)
High-Fiber Diet  Fiber, also called dietary fiber, is a type of carbohydrate found in fruits, vegetables, whole grains, and beans. A high-fiber diet can have many health benefits. Your health care provider may recommend a high-fiber diet to help:  · Prevent constipation. Fiber can make your bowel movements more regular.  · Lower your cholesterol.  · Relieve hemorrhoids, uncomplicated diverticulosis, or irritable bowel syndrome.  · Prevent overeating as part of a weight-loss plan.  · Prevent heart disease, type 2 diabetes, and certain cancers.    What is my plan?  The recommended daily intake of fiber includes:  · 38 grams for men under age 50.  · 30 grams for men over age 50.  · 25 grams for women under age 50.  · 21 grams for women over age 50.    You can get the recommended daily intake of dietary fiber by eating a variety of fruits, vegetables, grains, and beans. Your health care provider may also recommend a fiber supplement if it is not possible to get enough fiber through your diet.  What do I need to know about a high-fiber diet?  · Fiber supplements have not been widely studied for their effectiveness, so it is better to get fiber through food sources.  · Always check the fiber content on the nutrition facts label of any prepackaged food. Look for foods that contain at least 5 grams of fiber per serving.  · Ask your dietitian if you have questions about specific foods that are related to your condition, especially if those foods are not listed in the following section.  · Increase your daily fiber consumption gradually. Increasing your intake of dietary fiber too quickly may cause bloating, cramping, or gas.  · Drink plenty of water. Water helps you to digest fiber.  What foods can I eat?  Grains  Whole-grain breads. Multigrain cereal. Oats and oatmeal. Brown rice. Barley. Bulgur wheat. Millet. Bran muffins. Popcorn. Rye wafer crackers.  Vegetables   Sweet potatoes. Spinach. Kale. Artichokes. Cabbage. Broccoli. Green peas. Carrots. Squash.  Fruits  Berries. Pears. Apples. Oranges. Avocados. Prunes and raisins. Dried figs.  Meats and Other Protein Sources  Navy, kidney, pinto, and soy beans. Split peas. Lentils. Nuts and seeds.  Dairy  Fiber-fortified yogurt.  Beverages  Fiber-fortified soy milk. Fiber-fortified orange juice.  Other  Fiber bars.  The items listed above may not be a complete list of recommended foods or beverages. Contact your dietitian for more options.  What foods are not recommended?  Grains  White bread. Pasta made with refined flour. White rice.  Vegetables  Fried potatoes. Canned vegetables. Well-cooked vegetables.  Fruits  Fruit juice. Cooked, strained fruit.  Meats and Other Protein Sources  Fatty cuts of meat. Fried poultry or fried fish.  Dairy  Milk. Yogurt. Cream cheese. Sour cream.  Beverages  Soft drinks.  Other  Cakes and pastries. Butter and oils.  The items listed above may not be a complete list of foods and beverages to avoid. Contact your dietitian for more information.  What are some tips for including high-fiber foods in my diet?  · Eat a wide variety of high-fiber foods.  · Make sure that half of all grains consumed each day are whole grains.  · Replace breads and cereals made from refined flour or white flour with whole-grain breads and cereals.  · Replace white rice with brown rice, bulgur wheat, or millet.  · Start the day with a breakfast that is high in fiber,   such as a cereal that contains at least 5 grams of fiber per serving.  · Use beans in place of meat in soups, salads, or pasta.  · Eat high-fiber snacks, such as berries, raw vegetables, nuts, or popcorn.  This information is not intended to replace advice given to you by your health care provider. Make sure you discuss any questions you have with your health care provider.  Document Released: 10/06/2005 Document Revised: 03/13/2016 Document Reviewed: 03/21/2014   Elsevier Interactive Patient Education © 2018 Elsevier Inc.

## 2018-05-12 NOTE — Progress Notes (Signed)
GYNECOLOGY OFFICE FOLLOW UP NOTE  History:  37 y.o.  here today for follow up for pyosalpinx of right fallopian tube noted during laparoscopic appendectomy. Noted by surgeon during surgery, no evidence of TOA on imaging. Started on doxycycline post op. Patient feeling okay, states overall generally sore but the pain she was having prior to surgery has gone. She is also constipated. Does not have gynecologist, sees primary care for pap/gyn care.  Reports normal paps.  Past Medical History:  Diagnosis Date  . 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 Bipolar 1 disorder, mixed ([place])   . Bipolar disorder, mixed ([place])   . Depression   . Hair pulling   . HSIL on Pap smear of cervix 2013   [place]    Past Surgical History:  Procedure Laterality Date  . CHOLECYSTECTOMY    . LAPAROSCOPIC APPENDECTOMY N/A 05/03/2018   Procedure: APPENDECTOMY LAPAROSCOPIC;  Surgeon: Greer Pickerel, MD;  Location: [place] [facility];  Service: General;  Laterality: N/A;  . TUBAL LIGATION       Current Outpatient Medications:  .  amphetamine-dextroamphetamine (ADDERALL) 15 MG tablet, Take 15 mg by mouth daily., Disp: , Rfl:  .  doxycycline (VIBRA-TABS) 100 MG tablet, Take 1 tablet (100 mg total) by mouth every 12 (twelve) hours for 13 days., Disp: 26 tablet, Rfl: 0 .  HYDROcodone-acetaminophen (NORCO) 5-325 MG tablet, Take 1 tablet by mouth 3 (three) times daily as needed for severe pain. (Patient taking differently: Take 1 tablet by mouth 2 (two) times daily. ), Disp: 15 tablet, Rfl: 0 .  ondansetron (ZOFRAN-ODT) 4 MG disintegrating tablet, Take 1 tablet (4 mg total) by mouth every 6 (six) hours as needed for nausea., Disp: 20 tablet, Rfl: 0 .  venlafaxine (EFFEXOR) 37.5 MG tablet, Take 37.5 mg by mouth daily., Disp: , Rfl:  .  metroNIDAZOLE (FLAGYL) 500 MG tablet, Take 1 tablet (500 mg total) by mouth 2 (two) times daily for 14 days., Disp: 28 tablet, Rfl: 0 .  ondansetron (ZOFRAN ODT) 4  MG disintegrating tablet, Take 1 tablet (4 mg total) by mouth every 6 (six) hours as needed for nausea., Disp: 20 tablet, Rfl: 0  The following portions of the patient's history were reviewed and updated as appropriate: allergies, current medications, past family history, past medical history, past social history, past surgical history and problem list.   Review of Systems:  Pertinent items noted in HPI and remainder of comprehensive ROS otherwise negative.   Objective:  Physical Exam BP 111/83   Pulse (!) 122   Ht 5\' 4"  (1.626 m)   Wt 149 lb 6.4 oz (67.8 kg)   LMP 05/02/2018   BMI 25.64 kg/m  CONSTITUTIONAL: Well-developed, well-nourished female in no acute distress.  HENT:  Normocephalic, atraumatic. External right and left ear normal. Oropharynx is clear and moist EYES: Conjunctivae and EOM are normal. Pupils are equal, round, and reactive to light. No scleral icterus.  NECK: Normal range of motion, supple, no masses SKIN: Skin is warm and dry. No rash noted. Not diaphoretic. No erythema. No pallor. NEUROLOGIC: Alert and oriented to person, place, and time. Normal reflexes, muscle tone coordination. No cranial nerve deficit noted. PSYCHIATRIC: Normal mood and affect. Normal behavior. Normal judgment and thought content. CARDIOVASCULAR: Normal heart rate noted RESPIRATORY: Effort and breath sounds normal, no problems with respiration noted ABDOMEN: Soft, no distention noted.  Incisions with steri-strips on them, appear well approximated and healing well,  appropriately very mildly diffusely tender PELVIC: deferred MUSCULOSKELETAL: Normal range of motion. No edema noted.  Labs and Imaging   Assessment & Plan:   1. TOA (tubo-ovarian abscess) Cont doxycycline x 14 days Added flagyl x 14 days Reviewed that as she had no findings on imaging initially, would not recommend further imaging for resolution Patient will f/u in 3 months with gynecologist  2. Post-operative state Pain  appropriate  3. Constipation, unspecified constipation type Reviewed high fiber diet and to start metamucil to see if constipation improves  Routine preventative health maintenance measures emphasized. Please refer to After Visit Summary for other counseling recommendations.   Return in about 3 months (around 08/12/2018) for Followup.   Feliz Beam, M.D. [facility]

## 2018-05-13 ENCOUNTER — Encounter: Payer: Self-pay | Admitting: Obstetrics and Gynecology

## 2018-06-06 DIAGNOSIS — M549 Dorsalgia, unspecified: Secondary | ICD-10-CM | POA: Diagnosis not present

## 2018-06-08 MED FILL — DEXTROAMP-AMPHETAMIN 15 MG: 15 | 30 days supply | Qty: 60 | Fill #0

## 2018-06-17 ENCOUNTER — Encounter: Payer: Self-pay | Admitting: Family Medicine

## 2018-06-17 ENCOUNTER — Telehealth: Payer: Self-pay | Admitting: Family Medicine

## 2018-06-17 ENCOUNTER — Ambulatory Visit (INDEPENDENT_AMBULATORY_CARE_PROVIDER_SITE_OTHER): Payer: 59 | Admitting: Family Medicine

## 2018-06-17 ENCOUNTER — Ambulatory Visit (HOSPITAL_COMMUNITY)
Admission: RE | Admit: 2018-06-17 | Discharge: 2018-06-17 | Disposition: A | Discharge status: Home / Self Care | Payer: 59 | Source: Ambulatory Visit | Attending: Family Medicine | Admitting: Family Medicine

## 2018-06-17 ENCOUNTER — Other Ambulatory Visit: Payer: Self-pay

## 2018-06-17 VITALS — BP 116/68 | HR 117 | Temp 98.7°F | Resp 12 | Ht 64.5 in | Wt 147.0 lb

## 2018-06-17 DIAGNOSIS — R0602 Shortness of breath: Secondary | ICD-10-CM | POA: Diagnosis not present

## 2018-06-17 DIAGNOSIS — R000 Tachycardia, unspecified: Secondary | ICD-10-CM | POA: Insufficient documentation

## 2018-06-17 DIAGNOSIS — R0609 Other forms of dyspnea: Secondary | ICD-10-CM | POA: Diagnosis not present

## 2018-06-17 DIAGNOSIS — R079 Chest pain, unspecified: Secondary | ICD-10-CM | POA: Diagnosis not present

## 2018-06-17 MED ORDER — PREDNISONE 20 MG PO TABS: ORAL_TABLET | ORAL | 0 refills | Status: DC

## 2018-06-17 MED ORDER — IOPAMIDOL (ISOVUE-370) INJECTION 76%
100.0000 mL | Freq: Once | INTRAVENOUS | Status: AC | PRN
  Administered 2018-06-17: 100 mL via INTRAVENOUS

## 2018-06-17 MED ORDER — ALBUTEROL SULFATE HFA 108 (90 BASE) MCG/ACT IN AERS: 2.0000 | INHALATION_SPRAY | Freq: Four times a day (QID) | RESPIRATORY_TRACT | 0 refills | Status: DC | PRN
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
## 2018-06-17 NOTE — Progress Notes (Deleted)
Patient ID: MADELIENE TEJERA, female    DOB: [birth year], 37 y.o.   MRN: [id number]  Chief Complaint  Patient presents with  . breathing issues    sometimes finds it hard to catch her breath    Allergies Lamictal [lamotrigine] and Prozac [fluoxetine hcl]  Subjective:   BRYNNLIE UNTERREINER is a 37 y.o. female who presents to [facility] today.  HPI HPI  Past Medical History:  Diagnosis Date  . 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 Bipolar 1 disorder, mixed ([place])   . Bipolar disorder, mixed ([place])   . Depression   . Hair pulling   . HSIL on Pap smear of cervix 2013   [place]    Past Surgical History:  Procedure Laterality Date  . CHOLECYSTECTOMY    . LAPAROSCOPIC APPENDECTOMY N/A 05/03/2018   Procedure: APPENDECTOMY LAPAROSCOPIC;  Surgeon: Greer Pickerel, MD;  Location: [place];  Service: General;  Laterality: N/A;  . TUBAL LIGATION      Family History  Problem Relation Age of Onset  . Cancer Mother        lung  . Heart disease Mother   . Alcohol abuse Mother   . Bipolar disorder Mother   . Cancer Father        prostate      Social History   Socioeconomic History  . Marital status: Married    Spouse name: Not on file  . Number of children: Not on file  . Years of education: Not on file  . Highest education level: Not on file  Occupational History  . Not on file  Social Needs  . Financial resource strain: Not on file  . Food insecurity:    Worry: Not on file    Inability: Not on file  . Transportation needs:    Medical: Not on file    Non-medical: Not on file  Tobacco Use  . Smoking status: Current Every Day Smoker    Packs/day: 0.50    Types: Cigarettes  . Smokeless tobacco: Never Used  Substance and Sexual Activity  . Alcohol use: Yes    Alcohol/week: 0.0 standard drinks    Comment: socially only less than monthly  . Drug use: No  . Sexual activity: Yes  Lifestyle  . Physical activity:    Days per  week: Not on file    Minutes per session: Not on file  . Stress: Not on file  Relationships  . Social connections:    Talks on phone: Not on file    Gets together: Not on file    Attends religious service: Not on file    Active member of club or organization: Not on file    Attends meetings of clubs or organizations: Not on file    Relationship status: Not on file  Other Topics Concern  . Not on file  Social History Narrative  . Not on file    Review of Systems   Objective:   BP 116/68 (BP Location: Right Arm, Patient Position: Sitting, Cuff Size: Normal)   Pulse (!) 117   Temp 98.7 F (37.1 C) (Temporal)   Resp 12   Ht 5' 4.5" (1.638 m)   Wt 147 lb (66.7 kg)   SpO2 100% Comment: room air  BMI 24.84 kg/m   Physical Exam   Assessment and Plan   There are no diagnoses linked to this encounter.   No  follow-ups on file. Caren Macadam, MD 06/17/2018

## 2018-06-17 NOTE — Telephone Encounter (Signed)
Called and discussed results of CTangiogram with patient. Voiced understanding. At home and relaxing. Will call in steroid to start, symptoms possibly secondary to perfume spray/bronchospasm. Will also call in albuterol. Patient understands inhaler use.  Patient understands if symptoms worsen or she develops any worrisome s/s to call 911 or go to the ED.  Agree to call with problems or concerns and will follow up with PCP.  Patient counseled in detail regarding the risks of medication. Told to call or return to clinic if develop any worrisome signs or symptoms. Patient voiced understanding.

## 2018-06-25 DIAGNOSIS — F902 Attention-deficit hyperactivity disorder, combined type: Secondary | ICD-10-CM | POA: Diagnosis not present

## 2018-06-25 DIAGNOSIS — F411 Generalized anxiety disorder: Secondary | ICD-10-CM | POA: Diagnosis not present

## 2018-06-25 DIAGNOSIS — F319 Bipolar disorder, unspecified: Secondary | ICD-10-CM | POA: Diagnosis not present

## 2018-07-05 MED FILL — ARIPiprazole 2 MG TABS: 2 | 30 days supply | Qty: 30 | Fill #0

## 2018-07-05 MED FILL — VENLAFAXINE HCL ER 75 MG CA: 75 | 30 days supply | Qty: 30 | Fill #0

## 2018-07-06 MED FILL — DEXTROAMP-AMPHETAMIN 15 MG: 15 | 30 days supply | Qty: 60 | Fill #0

## 2018-08-05 MED FILL — VENLAFAXINE HCL ER 75 MG CA: 75 | 30 days supply | Qty: 30 | Fill #1

## 2018-08-24 ENCOUNTER — Encounter: Payer: Self-pay | Admitting: Family Medicine

## 2018-09-03 MED FILL — VENLAFAXINE HCL ER 75 MG CA: 75 | 30 days supply | Qty: 30 | Fill #2

## 2018-09-24 DIAGNOSIS — F319 Bipolar disorder, unspecified: Secondary | ICD-10-CM | POA: Diagnosis not present

## 2018-09-24 DIAGNOSIS — F411 Generalized anxiety disorder: Secondary | ICD-10-CM | POA: Diagnosis not present

## 2018-09-24 DIAGNOSIS — F902 Attention-deficit hyperactivity disorder, combined type: Secondary | ICD-10-CM | POA: Diagnosis not present

## 2018-10-05 DIAGNOSIS — M5126 Other intervertebral disc displacement, lumbar region: Secondary | ICD-10-CM | POA: Diagnosis not present

## 2018-10-06 MED FILL — VENLAFAXINE HCL ER 75 MG CA: 75 | 30 days supply | Qty: 30 | Fill #0

## 2018-10-23 DIAGNOSIS — H5213 Myopia, bilateral: Secondary | ICD-10-CM | POA: Diagnosis not present

## 2018-11-05 MED FILL — VENLAFAXINE HCL ER 75 MG CA: 75 | 30 days supply | Qty: 30 | Fill #1

## 2018-11-25 MED FILL — DEXTROAMP-AMPHETAMIN 15 MG: 15 | 30 days supply | Qty: 60 | Fill #0

## 2018-12-08 MED FILL — VENLAFAXINE HCL ER 75 MG CA: 75 | 30 days supply | Qty: 30 | Fill #2

## 2018-12-20 ENCOUNTER — Ambulatory Visit (INDEPENDENT_AMBULATORY_CARE_PROVIDER_SITE_OTHER): Payer: 59 | Admitting: Otolaryngology

## 2018-12-20 DIAGNOSIS — K219 Gastro-esophageal reflux disease without esophagitis: Secondary | ICD-10-CM

## 2018-12-20 DIAGNOSIS — J382 Nodules of vocal cords: Secondary | ICD-10-CM | POA: Diagnosis not present

## 2018-12-24 DIAGNOSIS — F902 Attention-deficit hyperactivity disorder, combined type: Secondary | ICD-10-CM | POA: Diagnosis not present

## 2018-12-24 DIAGNOSIS — F319 Bipolar disorder, unspecified: Secondary | ICD-10-CM | POA: Diagnosis not present

## 2018-12-24 DIAGNOSIS — F411 Generalized anxiety disorder: Secondary | ICD-10-CM | POA: Diagnosis not present

## 2018-12-24 MED FILL — busPIRone HCL 10 MG TABS: 10 | 30 days supply | Qty: 60 | Fill #0

## 2018-12-24 MED FILL — DEXTROAMP-AMPHETAMIN 20 MG: 20 | 30 days supply | Qty: 45 | Fill #0

## 2018-12-24 MED FILL — REXULTI 2 MG TABLET: 2 | 30 days supply | Qty: 30 | Fill #0

## 2018-12-30 ENCOUNTER — Telehealth: Payer: 59 | Admitting: Physician Assistant

## 2018-12-30 DIAGNOSIS — J019 Acute sinusitis, unspecified: Secondary | ICD-10-CM | POA: Diagnosis not present

## 2018-12-30 DIAGNOSIS — B9689 Other specified bacterial agents as the cause of diseases classified elsewhere: Secondary | ICD-10-CM

## 2018-12-30 MED ORDER — AMOXICILLIN-POT CLAVULANATE 875-125 MG PO TABS: 1.0000 | ORAL_TABLET | Freq: Two times a day (BID) | ORAL | 0 refills | Status: DC

## 2018-12-30 NOTE — Progress Notes (Signed)
I have spent 5 minutes in review of e-visit questionnaire, review and updating patient chart, medical decision making and response to patient.   Rasheena Talmadge Cody Asna Muldrow, PA-C    

## 2018-12-30 NOTE — Progress Notes (Signed)

## 2019-01-03 MED FILL — VENLAFAXINE HCL ER 75 MG CA: 75 | 30 days supply | Qty: 30 | Fill #0 | Status: TO

## 2019-01-10 ENCOUNTER — Other Ambulatory Visit: Payer: Self-pay

## 2019-01-10 ENCOUNTER — Encounter: Payer: Self-pay | Admitting: Family Medicine

## 2019-01-10 ENCOUNTER — Ambulatory Visit (INDEPENDENT_AMBULATORY_CARE_PROVIDER_SITE_OTHER): Payer: 59 | Admitting: Family Medicine

## 2019-01-10 VITALS — BP 112/60 | HR 120 | Temp 98.8°F | Resp 12 | Ht 64.5 in | Wt 154.0 lb

## 2019-01-10 DIAGNOSIS — R1031 Right lower quadrant pain: Secondary | ICD-10-CM

## 2019-01-10 DIAGNOSIS — N7093 Salpingitis and oophoritis, unspecified: Secondary | ICD-10-CM | POA: Diagnosis not present

## 2019-01-10 DIAGNOSIS — R109 Unspecified abdominal pain: Secondary | ICD-10-CM | POA: Diagnosis not present

## 2019-01-10 LAB — URINALYSIS, ROUTINE W REFLEX MICROSCOPIC
BILIRUBIN URINE: NEGATIVE
GLUCOSE, UA: NEGATIVE
HGB URINE DIPSTICK: NEGATIVE
KETONES UR: NEGATIVE
Leukocytes,Ua: NEGATIVE
NITRITE: NEGATIVE
PH: 5.5 (ref 5.0–8.0)
Protein, ur: NEGATIVE
Specific Gravity, Urine: 1.026 (ref 1.001–1.03)

## 2019-01-10 LAB — PREGNANCY, URINE: Preg Test, Ur: NEGATIVE

## 2019-01-10 MED ORDER — FLUCONAZOLE 150 MG PO TABS: ORAL_TABLET | ORAL | 0 refills | Status: DC

## 2019-01-10 MED ORDER — DOXYCYCLINE HYCLATE 100 MG PO TABS: 100.0000 mg | ORAL_TABLET | Freq: Two times a day (BID) | ORAL | 0 refills | Status: DC

## 2019-01-10 MED ORDER — METRONIDAZOLE 500 MG PO TABS: 500.0000 mg | ORAL_TABLET | Freq: Two times a day (BID) | ORAL | 0 refills | Status: DC

## 2019-01-10 NOTE — Progress Notes (Signed)
   Subjective:    Patient ID: [name], female    DOB: 01-18-1981, 38 y.o.   MRN: [id number]  Patient presents for Abdominal Pain (x3 days- R lower quad pain- no change to bowels- currently on cycle)  Patient here with right lower abdominal pain similar to her previous tubo-ovarian abscess which was found when she had appendicitis back in the summer of last year.  Chills and nausea, no fever, no vomiting, all movements are normal.  Moreover a sharp and crampy pain.  It does not radiate.  She denies any vaginal bleeding she denies any vaginal discharge.  She has not had her menstrual cycle yet it was supposed to come a couple days ago.  However she has had tubal ligation.     Tachycardia- on effexor and adderall keeps her heart rate elevated.  She denies any chest pain or palpitations  Have her hydrocodone at home but has not required any yet.  Review Of Systems:  GEN- denies fatigue, fever, weight loss,weakness, recent illness HEENT- denies eye drainage, change in vision, nasal discharge, CVS- denies chest pain, palpitations RESP- denies SOB, cough, wheeze ABD- denies N/V, change in stools, +abd pain GU- denies dysuria, hematuria, dribbling, incontinence MSK- denies joint pain, muscle aches, injury Neuro- denies headache, dizziness, syncope, seizure activity       Objective:    BP 112/60   Pulse (!) 120   Temp 98.8 F (37.1 C) (Oral)   Resp 12   Ht 5' 4.5" (1.638 m)   Wt 154 lb (69.9 kg)   LMP 01/08/2019 Comment: regular  SpO2 98%   BMI 26.03 kg/m  GEN- NAD, alert and oriented x3 HEENT- PERRL, EOMI, non injected sclera, pink conjunctiva, MMM, oropharynx clear Neck- Supple, no thyromegaly CVS- RR, tachycardia HR 110, no murmur RESP-CTAB ABD-NABS,soft,mild TTP RLQ, no rebound, no guarding, no CVA tenderness EXT- No edema Pulses- Radial, 2+        Assessment & Plan:      Problem List Items Addressed This Visit    None    Visit Diagnoses    Right lower  quadrant abdominal pain    -  Primary   RLQ pain,. UA and Upreg negative, concern for possible recurrent ovarian abscess, no GI symptoms to suggest colitis, will treat empirically in setting of COVID-19 She does not have acute abdomen , no fever Check labs  Start doxycycline and flagyl, given red flags to see urgent care    Relevant Orders   Urinalysis, Routine w reflex microscopic (Completed)   Pregnancy, urine (Completed)   CBC with Differential/Platelet   Comprehensive metabolic panel   Right tubo-ovarian abscess       Relevant Orders   CBC with Differential/Platelet   Comprehensive metabolic panel      Note: This dictation was prepared with Dragon dictation along with smaller phrase technology. Any transcriptional errors that result from this process are unintentional.

## 2019-01-10 NOTE — Patient Instructions (Signed)
Take doxycycline  And flagyl  Diflucan as needed We will send mychart with lab results

## 2019-01-11 ENCOUNTER — Encounter: Payer: Self-pay | Admitting: Family Medicine

## 2019-01-11 LAB — CBC WITH DIFFERENTIAL/PLATELET
ABSOLUTE MONOCYTES: 510 {cells}/uL (ref 200–950)
BASOS PCT: 0.4 %
Basophils Absolute: 30 cells/uL (ref 0–200)
EOS ABS: 150 {cells}/uL (ref 15–500)
Eosinophils Relative: 2 %
HCT: 44.3 % (ref 35.0–45.0)
Hemoglobin: 14.8 g/dL (ref 11.7–15.5)
Lymphs Abs: 1815 cells/uL (ref 850–3900)
MCH: 27.4 pg (ref 27.0–33.0)
MCHC: 33.4 g/dL (ref 32.0–36.0)
MCV: 81.9 fL (ref 80.0–100.0)
MONOS PCT: 6.8 %
MPV: 11.7 fL (ref 7.5–12.5)
NEUTROS PCT: 66.6 %
Neutro Abs: 4995 cells/uL (ref 1500–7800)
PLATELETS: 219 10*3/uL (ref 140–400)
RBC: 5.41 10*6/uL — AB (ref 3.80–5.10)
RDW: 13.1 % (ref 11.0–15.0)
TOTAL LYMPHOCYTE: 24.2 %
WBC: 7.5 10*3/uL (ref 3.8–10.8)

## 2019-01-11 LAB — COMPREHENSIVE METABOLIC PANEL
AG RATIO: 1.3 (calc) (ref 1.0–2.5)
ALKALINE PHOSPHATASE (APISO): 90 U/L (ref 31–125)
ALT: 34 U/L — ABNORMAL HIGH (ref 6–29)
AST: 31 U/L — AB (ref 10–30)
Albumin: 3.9 g/dL (ref 3.6–5.1)
BILIRUBIN TOTAL: 0.3 mg/dL (ref 0.2–1.2)
BUN: 12 mg/dL (ref 7–25)
CHLORIDE: 106 mmol/L (ref 98–110)
CO2: 25 mmol/L (ref 20–32)
Calcium: 9.2 mg/dL (ref 8.6–10.2)
Creat: 0.73 mg/dL (ref 0.50–1.10)
GLOBULIN: 3.1 g/dL (ref 1.9–3.7)
Glucose, Bld: 62 mg/dL — ABNORMAL LOW (ref 65–99)
Potassium: 4.6 mmol/L (ref 3.5–5.3)
Sodium: 138 mmol/L (ref 135–146)
Total Protein: 7 g/dL (ref 6.1–8.1)

## 2019-02-02 DIAGNOSIS — M5126 Other intervertebral disc displacement, lumbar region: Secondary | ICD-10-CM | POA: Diagnosis not present

## 2019-02-04 MED FILL — VENLAFAXINE HCL ER 75 MG CA: 75 | 30 days supply | Qty: 30 | Fill #0

## 2019-03-09 MED FILL — VENLAFAXINE HCL ER 75 MG CA: 75 | 30 days supply | Qty: 30 | Fill #1

## 2019-03-13 DIAGNOSIS — F329 Major depressive disorder, single episode, unspecified: Secondary | ICD-10-CM | POA: Diagnosis not present

## 2019-03-15 ENCOUNTER — Encounter: Payer: Self-pay | Admitting: Family Medicine

## 2019-03-15 DIAGNOSIS — R748 Abnormal levels of other serum enzymes: Secondary | ICD-10-CM

## 2019-03-16 NOTE — Telephone Encounter (Signed)
I do not see where patient was supposed to return for a recheck on her liver enzymes. I see that they were mildly elevated in March. Would you like for her to come in for additional labs for liver enzymes

## 2019-03-21 ENCOUNTER — Ambulatory Visit (INDEPENDENT_AMBULATORY_CARE_PROVIDER_SITE_OTHER): Payer: 59 | Admitting: Otolaryngology

## 2019-03-21 DIAGNOSIS — F411 Generalized anxiety disorder: Secondary | ICD-10-CM | POA: Diagnosis not present

## 2019-03-21 DIAGNOSIS — F902 Attention-deficit hyperactivity disorder, combined type: Secondary | ICD-10-CM | POA: Diagnosis not present

## 2019-03-21 DIAGNOSIS — F319 Bipolar disorder, unspecified: Secondary | ICD-10-CM | POA: Diagnosis not present

## 2019-03-21 MED FILL — busPIRone HCL 10 MG TABS: 10 | 30 days supply | Qty: 60 | Fill #0

## 2019-03-24 MED FILL — ARIPiprazole 2 MG TABS: 2 | 30 days supply | Qty: 30 | Fill #0

## 2019-04-04 ENCOUNTER — Other Ambulatory Visit: Payer: Self-pay | Admitting: Family Medicine

## 2019-04-04 DIAGNOSIS — R748 Abnormal levels of other serum enzymes: Secondary | ICD-10-CM | POA: Diagnosis not present

## 2019-04-05 LAB — COMPLETE METABOLIC PANEL WITH GFR
AG Ratio: 1.5 (calc) (ref 1.0–2.5)
ALT: 33 U/L — ABNORMAL HIGH (ref 6–29)
AST: 28 U/L (ref 10–30)
Albumin: 4.3 g/dL (ref 3.6–5.1)
Alkaline phosphatase (APISO): 94 U/L (ref 31–125)
BUN: 11 mg/dL (ref 7–25)
CO2: 28 mmol/L (ref 20–32)
Calcium: 9.3 mg/dL (ref 8.6–10.2)
Chloride: 100 mmol/L (ref 98–110)
Creat: 0.76 mg/dL (ref 0.50–1.10)
GFR, Est African American: 117 mL/min/{1.73_m2} (ref >=60)
GFR, Est Non African American: 101 mL/min/{1.73_m2} (ref >=60)
Globulin: 2.9 g/dL (calc) (ref 1.9–3.7)
Glucose, Bld: 87 mg/dL (ref 65–139)
Potassium: 4 mmol/L (ref 3.5–5.3)
Sodium: 134 mmol/L — ABNORMAL LOW (ref 135–146)
Total Bilirubin: 0.3 mg/dL (ref 0.2–1.2)
Total Protein: 7.2 g/dL (ref 6.1–8.1)

## 2019-04-06 DIAGNOSIS — M5126 Other intervertebral disc displacement, lumbar region: Secondary | ICD-10-CM | POA: Diagnosis not present

## 2019-04-29 MED FILL — ARIPiprazole 2 MG TABS: 2 | 30 days supply | Qty: 30 | Fill #1

## 2019-04-29 MED FILL — VENLAFAXINE HCL ER 75 MG CA: 75 | 30 days supply | Qty: 30 | Fill #0

## 2019-05-18 DIAGNOSIS — F902 Attention-deficit hyperactivity disorder, combined type: Secondary | ICD-10-CM | POA: Diagnosis not present

## 2019-05-18 DIAGNOSIS — F411 Generalized anxiety disorder: Secondary | ICD-10-CM | POA: Diagnosis not present

## 2019-05-18 DIAGNOSIS — F319 Bipolar disorder, unspecified: Secondary | ICD-10-CM | POA: Diagnosis not present

## 2019-05-18 MED FILL — VENLAFAXINE HCL ER 37.5 MG: 37.5 | 30 days supply | Qty: 30 | Fill #0

## 2019-05-18 MED FILL — buPROPion HCL ER (XL) 150 M: 150 | 30 days supply | Qty: 30 | Fill #0

## 2019-06-13 MED FILL — buPROPion HCL ER (XL) 150 M: 150 | 30 days supply | Qty: 30 | Fill #1

## 2019-06-16 MED FILL — VENLAFAXINE HCL ER 75 MG CA: 75 | 30 days supply | Qty: 30 | Fill #0

## 2019-07-14 MED FILL — buPROPion HCL ER (XL) 150 M: 150 | 30 days supply | Qty: 30 | Fill #0

## 2019-07-25 MED FILL — VENLAFAXINE HCL ER 75 MG CA: 75 | 30 days supply | Qty: 30 | Fill #1

## 2019-07-25 MED FILL — ARIPiprazole 2 MG TABS: 2 | 30 days supply | Qty: 30 | Fill #2

## 2019-08-03 ENCOUNTER — Telehealth: Payer: 59 | Admitting: Family

## 2019-08-03 DIAGNOSIS — M5126 Other intervertebral disc displacement, lumbar region: Secondary | ICD-10-CM | POA: Diagnosis not present

## 2019-08-03 DIAGNOSIS — R399 Unspecified symptoms and signs involving the genitourinary system: Secondary | ICD-10-CM

## 2019-08-03 MED ORDER — CEPHALEXIN 500 MG PO CAPS: 500.0000 mg | ORAL_CAPSULE | Freq: Two times a day (BID) | ORAL | 0 refills | Status: DC

## 2019-08-03 MED FILL — CEPHALEXIN 500 MG CAPSULE: 500 | 7 days supply | Qty: 14 | Fill #0

## 2019-08-03 NOTE — Progress Notes (Signed)

## 2019-08-08 ENCOUNTER — Ambulatory Visit (INDEPENDENT_AMBULATORY_CARE_PROVIDER_SITE_OTHER): Payer: 59 | Admitting: Family Medicine

## 2019-08-08 ENCOUNTER — Other Ambulatory Visit: Payer: Self-pay

## 2019-08-08 ENCOUNTER — Ambulatory Visit (HOSPITAL_COMMUNITY)
Admission: RE | Admit: 2019-08-08 | Discharge: 2019-08-08 | Disposition: A | Discharge status: Home / Self Care | Payer: 59 | Source: Ambulatory Visit | Attending: Family Medicine | Admitting: Family Medicine

## 2019-08-08 ENCOUNTER — Encounter: Payer: Self-pay | Admitting: Family Medicine

## 2019-08-08 VITALS — BP 120/62 | HR 90 | Temp 99.0°F | Resp 12 | Ht 64.5 in | Wt 160.0 lb

## 2019-08-08 DIAGNOSIS — G8929 Other chronic pain: Secondary | ICD-10-CM | POA: Insufficient documentation

## 2019-08-08 DIAGNOSIS — M4802 Spinal stenosis, cervical region: Secondary | ICD-10-CM | POA: Diagnosis not present

## 2019-08-08 DIAGNOSIS — G542 Cervical root disorders, not elsewhere classified: Secondary | ICD-10-CM | POA: Diagnosis not present

## 2019-08-08 DIAGNOSIS — M542 Cervicalgia: Secondary | ICD-10-CM | POA: Insufficient documentation

## 2019-08-08 MED ORDER — MELOXICAM 7.5 MG PO TABS: 7.5000 mg | ORAL_TABLET | Freq: Every day | ORAL | 2 refills | Status: DC

## 2019-08-08 MED ORDER — METHOCARBAMOL 500 MG PO TABS: 500.0000 mg | ORAL_TABLET | Freq: Four times a day (QID) | ORAL | 1 refills | Status: DC
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
## 2019-08-08 NOTE — Patient Instructions (Signed)
Get xray of neck Take robaxin as needed, mobic once a day for inflammation F/U pending results

## 2019-08-10 ENCOUNTER — Encounter: Payer: Self-pay | Admitting: Family Medicine

## 2019-08-10 DIAGNOSIS — G542 Cervical root disorders, not elsewhere classified: Secondary | ICD-10-CM

## 2019-08-10 DIAGNOSIS — M5030 Other cervical disc degeneration, unspecified cervical region: Secondary | ICD-10-CM

## 2019-08-10 DIAGNOSIS — S39012A Strain of muscle, fascia and tendon of lower back, initial encounter: Secondary | ICD-10-CM

## 2019-08-10 DIAGNOSIS — M542 Cervicalgia: Secondary | ICD-10-CM

## 2019-08-10 MED ORDER — HYDROCODONE-ACETAMINOPHEN 5-325 MG PO TABS: 1.0000 | ORAL_TABLET | Freq: Three times a day (TID) | ORAL | 0 refills | Status: DC | PRN

## 2019-08-15 MED FILL — buPROPion HCL ER (XL) 150 M: 150 | 30 days supply | Qty: 30 | Fill #1

## 2019-08-18 ENCOUNTER — Other Ambulatory Visit: Payer: Self-pay

## 2019-08-18 ENCOUNTER — Ambulatory Visit (HOSPITAL_COMMUNITY)
Admission: RE | Admit: 2019-08-18 | Discharge: 2019-08-18 | Disposition: A | Discharge status: Home / Self Care | Payer: 59 | Source: Ambulatory Visit | Attending: Family Medicine | Admitting: Family Medicine

## 2019-08-18 DIAGNOSIS — G542 Cervical root disorders, not elsewhere classified: Secondary | ICD-10-CM | POA: Insufficient documentation

## 2019-08-18 DIAGNOSIS — M542 Cervicalgia: Secondary | ICD-10-CM | POA: Diagnosis not present

## 2019-08-18 DIAGNOSIS — M5030 Other cervical disc degeneration, unspecified cervical region: Secondary | ICD-10-CM | POA: Diagnosis present

## 2019-08-19 ENCOUNTER — Ambulatory Visit (HOSPITAL_COMMUNITY): Payer: 59

## 2019-08-22 MED FILL — VENLAFAXINE HCL ER 75 MG CA: 75 | 30 days supply | Qty: 30 | Fill #2

## 2019-09-01 DIAGNOSIS — M5412 Radiculopathy, cervical region: Secondary | ICD-10-CM | POA: Diagnosis not present

## 2019-09-01 DIAGNOSIS — Z6827 Body mass index (BMI) 27.0-27.9, adult: Secondary | ICD-10-CM | POA: Diagnosis not present

## 2019-09-01 DIAGNOSIS — R030 Elevated blood-pressure reading, without diagnosis of hypertension: Secondary | ICD-10-CM | POA: Diagnosis not present

## 2019-09-19 ENCOUNTER — Other Ambulatory Visit: Payer: Self-pay | Admitting: *Deleted

## 2019-09-19 MED ORDER — ARIPIPRAZOLE 2 MG PO TABS: 2.0000 mg | ORAL_TABLET | Freq: Every day | ORAL | 0 refills | Status: DC

## 2019-09-19 MED ORDER — AMPHETAMINE-DEXTROAMPHETAMINE 15 MG PO TABS: 15.0000 mg | ORAL_TABLET | Freq: Every day | ORAL | 0 refills | Status: DC

## 2019-09-19 MED FILL — buPROPion HCL ER (XL) 150 M: 150 | 30 days supply | Qty: 30 | Fill #2

## 2019-09-19 NOTE — Telephone Encounter (Signed)
Received call from patient.   Reports that she has first appointment with [facility] in 2 weeks, but she is out of her medications.   Requested refill on Abilify 2mg  and Addetall 15mg  until she can be seen by psych.  MD please advise.

## 2019-09-19 NOTE — Addendum Note (Signed)
Addended by: Sheral Flow on: 09/19/2019 09:25 AM   Modules accepted: Orders

## 2019-09-19 NOTE — Telephone Encounter (Signed)
Call placed to patient and patient made aware.   Patient requesting refills to be sent to [facility] in [place] instead of [place].   Ok to re-send?

## 2019-09-19 NOTE — Telephone Encounter (Signed)
30 day supply sent of both medications

## 2019-09-27 DIAGNOSIS — F319 Bipolar disorder, unspecified: Secondary | ICD-10-CM | POA: Diagnosis not present

## 2019-09-27 DIAGNOSIS — F902 Attention-deficit hyperactivity disorder, combined type: Secondary | ICD-10-CM | POA: Diagnosis not present

## 2019-09-27 DIAGNOSIS — F411 Generalized anxiety disorder: Secondary | ICD-10-CM | POA: Diagnosis not present

## 2019-09-27 MED FILL — VENLAFAXINE HCL ER 75 MG CA: 75 | 30 days supply | Qty: 30 | Fill #0

## 2019-09-27 MED FILL — ARIPiprazole 2 MG TABS: 2 | 30 days supply | Qty: 30 | Fill #0

## 2019-09-27 MED FILL — busPIRone HCL 10 MG TABS: 10 | 30 days supply | Qty: 60 | Fill #0

## 2019-09-29 MED FILL — DEXTROAMP-AMPHETAMIN 20 MG: 20 | 30 days supply | Qty: 45 | Fill #0

## 2019-10-18 MED FILL — buPROPion HCL ER (XL) 150 M: 150 | 30 days supply | Qty: 30 | Fill #0

## 2019-10-25 MED FILL — VENLAFAXINE HCL ER 75 MG CA: 75 | 30 days supply | Qty: 30 | Fill #1

## 2019-10-25 MED FILL — ARIPiprazole 2 MG TABS: 2 | 30 days supply | Qty: 30 | Fill #1

## 2019-11-06 ENCOUNTER — Encounter: Payer: Self-pay | Admitting: Family Medicine

## 2019-11-24 MED FILL — VENLAFAXINE HCL ER 75 MG CA: 75 | 30 days supply | Qty: 30 | Fill #2

## 2019-11-24 MED FILL — buPROPion HCL ER (XL) 150 M: 150 | 30 days supply | Qty: 30 | Fill #1

## 2019-12-07 DIAGNOSIS — M5126 Other intervertebral disc displacement, lumbar region: Secondary | ICD-10-CM | POA: Diagnosis not present

## 2019-12-26 DIAGNOSIS — F411 Generalized anxiety disorder: Secondary | ICD-10-CM | POA: Diagnosis not present

## 2019-12-26 DIAGNOSIS — F319 Bipolar disorder, unspecified: Secondary | ICD-10-CM | POA: Diagnosis not present

## 2019-12-26 DIAGNOSIS — F902 Attention-deficit hyperactivity disorder, combined type: Secondary | ICD-10-CM | POA: Diagnosis not present

## 2019-12-26 MED FILL — busPIRone HCL 10 MG TABS: 10 | 30 days supply | Qty: 60 | Fill #0

## 2019-12-26 MED FILL — VENLAFAXINE HCL ER 75 MG CA: 75 | 30 days supply | Qty: 30 | Fill #0

## 2019-12-26 MED FILL — AMPHETAMINE-DEXTROAMPHETAMI: 20 | 30 days supply | Qty: 45 | Fill #0

## 2019-12-26 MED FILL — buPROPion HCL ER (XL) 150 M: 150 | 30 days supply | Qty: 30 | Fill #0

## 2020-01-05 MED FILL — REXULTI 1 MG TABLET: 1 | 30 days supply | Qty: 30 | Fill #0

## 2020-01-22 ENCOUNTER — Encounter: Payer: Self-pay | Admitting: Physician Assistant

## 2020-01-22 ENCOUNTER — Telehealth: Payer: 59 | Admitting: Physician Assistant

## 2020-01-22 DIAGNOSIS — J309 Allergic rhinitis, unspecified: Secondary | ICD-10-CM

## 2020-01-22 DIAGNOSIS — H1013 Acute atopic conjunctivitis, bilateral: Secondary | ICD-10-CM

## 2020-01-22 MED ORDER — OLOPATADINE HCL 0.1 % OP SOLN: 1.0000 [drp] | Freq: Two times a day (BID) | OPHTHALMIC | 12 refills | Status: DC

## 2020-01-22 MED ORDER — FLUTICASONE PROPIONATE 50 MCG/ACT NA SUSP: 2.0000 | Freq: Every day | NASAL | 6 refills | Status: DC

## 2020-01-22 NOTE — Progress Notes (Signed)
E visit for Allergic Rhinitis We are sorry that you are not feeling well.  Here is how we plan to help!  Based on what you have shared with me it looks like you have Allergic Rhinitis.  Rhinitis is when a reaction occurs that causes nasal congestion, runny nose, sneezing, and itching.  Most types of rhinitis are caused by an inflammation and are associated with symptoms in the eyes ears or throat. There are several types of rhinitis.  The most common are acute rhinitis, which is usually caused by a viral illness, allergic or seasonal rhinitis, and nonallergic or year-round rhinitis.  Nasal allergies occur certain times of the year.  Allergic rhinitis is caused when allergens in the air trigger the release of histamine in the body.  Histamine causes itching, swelling, and fluid to build up in the fragile linings of the nasal passages, sinuses and eyelids.  An itchy nose and clear discharge are common.  I recommend the following over the counter treatments: Allegra 60 mg twice daily  I also would recommend a nasal spray: Flonase 2 sprays into each nostril once daily for 7 days  For the allergies on the eye I have prescribed Pataday 1% eye drops, one drop in each eye twice daily.   I have sent the Flonase and Pataday eye drops to your pharmacy.    HOME CARE:   You can use an over-the-counter saline nasal spray as needed  Avoid areas where there is heavy dust, mites, or molds  Stay indoors on windy days during the pollen season  Keep windows closed in home, at least in bedroom; use air conditioner.  Use high-efficiency house air filter  Keep windows closed in car, turn AC on re-circulate  Avoid playing out with dog during pollen season  GET HELP RIGHT AWAY IF:   If your symptoms do not improve within 10 days  You become short of breath  You develop yellow or green discharge from your nose for over 3 days  You have coughing fits  MAKE SURE YOU:   Understand these  instructions  Will watch your condition  Will get help right away if you are not doing well or get worse  Thank you for choosing an e-visit. Your e-visit answers were reviewed by a board certified advanced clinical practitioner to complete your personal care plan. Depending upon the condition, your plan could have included both over the counter or prescription medications. Please review your pharmacy choice. Be sure that the pharmacy you have chosen is open so that you can pick up your prescription now.  If there is a problem you may message your provider in [place] to have the prescription routed to another pharmacy. Your safety is important to [place]. If you have drug allergies check your prescription carefully.  For the next 24 hours, you can use MyChart to ask questions about today's visit, request a non-urgent call back, or ask for a work or school excuse from your e-visit provider. You will get an email in the next two days asking about your experience. I hope that your e-visit has been valuable and will speed your recovery.        I spent 5-10 minutes on review and completion of this note- Lacy [facility]

## 2020-01-23 MED FILL — VENLAFAXINE HCL ER 75 MG CA: 75 | 30 days supply | Qty: 30 | Fill #1

## 2020-01-23 MED FILL — buPROPion HCL ER (XL) 150 M: 150 | 30 days supply | Qty: 30 | Fill #1

## 2020-02-03 MED FILL — REXULTI 1 MG TABLET: 1 | 30 days supply | Qty: 30 | Fill #1

## 2020-02-10 ENCOUNTER — Encounter: Payer: Self-pay | Admitting: Family Medicine

## 2020-02-10 ENCOUNTER — Ambulatory Visit (INDEPENDENT_AMBULATORY_CARE_PROVIDER_SITE_OTHER): Payer: 59 | Admitting: Family Medicine

## 2020-02-10 ENCOUNTER — Other Ambulatory Visit: Payer: Self-pay

## 2020-02-10 VITALS — BP 118/68 | HR 108 | Temp 98.1°F | Resp 14 | Ht 64.5 in | Wt 170.0 lb

## 2020-02-10 DIAGNOSIS — G43109 Migraine with aura, not intractable, without status migrainosus: Secondary | ICD-10-CM | POA: Diagnosis not present

## 2020-02-10 DIAGNOSIS — Z0001 Encounter for general adult medical examination with abnormal findings: Secondary | ICD-10-CM | POA: Diagnosis not present

## 2020-02-10 DIAGNOSIS — M5416 Radiculopathy, lumbar region: Secondary | ICD-10-CM

## 2020-02-10 DIAGNOSIS — F319 Bipolar disorder, unspecified: Secondary | ICD-10-CM

## 2020-02-10 DIAGNOSIS — Z0000 Encounter for general adult medical examination without abnormal findings: Secondary | ICD-10-CM | POA: Diagnosis not present

## 2020-02-10 DIAGNOSIS — Z124 Encounter for screening for malignant neoplasm of cervix: Secondary | ICD-10-CM | POA: Diagnosis not present

## 2020-02-10 MED ORDER — UBRELVY 100 MG PO TABS: ORAL_TABLET | ORAL | 1 refills | Status: DC

## 2020-02-10 NOTE — Patient Instructions (Signed)
F/U 1 year for physical I recommend eye visit once a year I recommend dental visit every 6 months Goal is to  Exercise 30 minutes 5 days a week We will send  mychart with lab results

## 2020-02-10 NOTE — Progress Notes (Signed)
Subjective:    Patient ID: [name], female    DOB: Nov 28, 1980, 39 y.o.   MRN: [id number]  Patient presents for Gynecologic Exam (is fasting)  Patient here for CPE with Pap smear. Last Pap smear May 2019 however abnormal with ASCUS without HPV due for repeat. Immunizations up-to-date including COVID-19 vaccine  She was seen by eye doctor due to vision changes in right eye, like there a film. She was told macular edema . Also told there was some cholesterol in her eyes  She was on thrive system Multivitamin/protein shake system, she did stop it didn't  She has appt with Retina specialist next week  Migraines- she has been off meds for a few years , now gets severe headache 1-2 times every 2 weeks, usually at end of work day, has pain in eyes with it   she has been taking BC powder   Chronic back pain- taking , does not take gabapentin every night, as it makes her drwosy, and she often oversleeps with it  Dentist utd   Menses regular   Review Of Systems:  GEN- denies fatigue, fever, weight loss,weakness, recent illness HEENT- denies eye drainage, change in vision, nasal discharge, CVS- denies chest pain, palpitations RESP- denies SOB, cough, wheeze ABD- denies N/V, change in stools, abd pain GU- denies dysuria, hematuria, dribbling, incontinence MSK- + joint pain, muscle aches, injury Neuro- denies headache, dizziness, syncope, seizure activity       Objective:    BP 118/68   Pulse (!) 108   Temp 98.1 F (36.7 C) (Temporal)   Resp 14   Ht 5' 4.5" (1.638 m)   Wt 170 lb (77.1 kg)   LMP 01/30/2020 Comment: regular  SpO2 97%   BMI 28.73 kg/m  GEN- NAD, alert and oriented x3 HEENT- PERRL, EOMI, non injected sclera, pink conjunctiva, MMM, oropharynx clear Neck- Supple, no thyromegaly Breast- normal symmetry, no nipple inversion,no nipple drainage, no nodules or lumps felt Nodes- no axillary nodes CVS- RRR, no murmur RESP-CTAB ABD-NABS,soft,NT,ND GU- normal  external genitalia, vaginal mucosa pink and moist, cervix visualized no growth, no blood form os, minimal thin clear discharge, no CMT, no ovarian masses, uterus normal size Psych- normal affect and mood  EXT- No edema Pulses- Radial, DP- 2+        Assessment & Plan:      Problem List Items Addressed This Visit      Unprioritized   Bipolar I disorder ([place])    Followed by psychiatry, has been stable on meds      Lumbar back pain with radiculopathy affecting right lower extremity    Prn pain meds, occ use of gabapentin      Relevant Medications   REXULTI 1 MG TABS tablet   Migraines    Given samples of Ubrelvy, this will not interact with her current meds Will see how many she requires  topamax interacts with her Rexulti      Relevant Medications   Ubrogepant (UBRELVY) 100 MG TABS    Other Visit Diagnoses    Routine general medical examination at a health care facility    -  Primary   CPE done, fasting labs obtained, PAP Smear done   Relevant Orders   CBC with Differential/Platelet (Completed)   Comprehensive metabolic panel (Completed)   Lipid panel (Completed)   TSH (Completed)   Cervical cancer screening       Relevant Orders   Pap IG w/ reflex to HPV when ASC-U  Note: This dictation was prepared with Dragon dictation along with smaller phrase technology. Any transcriptional errors that result from this process are unintentional.

## 2020-02-11 ENCOUNTER — Encounter: Payer: Self-pay | Admitting: Family Medicine

## 2020-02-11 LAB — LIPID PANEL
Cholesterol: 210 mg/dL — ABNORMAL HIGH (ref <200)
HDL: 42 mg/dL — ABNORMAL LOW (ref >=50)
LDL Cholesterol (Calc): 126 mg/dL (calc) — ABNORMAL HIGH
Non-HDL Cholesterol (Calc): 168 mg/dL (calc) — ABNORMAL HIGH (ref <130)
Total CHOL/HDL Ratio: 5 (calc) — ABNORMAL HIGH (ref <5.0)
Triglycerides: 294 mg/dL — ABNORMAL HIGH (ref <150)

## 2020-02-11 LAB — COMPREHENSIVE METABOLIC PANEL
AG Ratio: 1.3 (calc) (ref 1.0–2.5)
ALT: 44 U/L — ABNORMAL HIGH (ref 6–29)
AST: 32 U/L — ABNORMAL HIGH (ref 10–30)
Albumin: 3.8 g/dL (ref 3.6–5.1)
Alkaline phosphatase (APISO): 104 U/L (ref 31–125)
BUN: 8 mg/dL (ref 7–25)
CO2: 26 mmol/L (ref 20–32)
Calcium: 9.1 mg/dL (ref 8.6–10.2)
Chloride: 103 mmol/L (ref 98–110)
Creat: 0.89 mg/dL (ref 0.50–1.10)
Globulin: 3 g/dL (calc) (ref 1.9–3.7)
Glucose, Bld: 66 mg/dL (ref 65–99)
Potassium: 4.1 mmol/L (ref 3.5–5.3)
Sodium: 137 mmol/L (ref 135–146)
Total Bilirubin: 0.4 mg/dL (ref 0.2–1.2)
Total Protein: 6.8 g/dL (ref 6.1–8.1)

## 2020-02-11 LAB — CBC WITH DIFFERENTIAL/PLATELET
Absolute Monocytes: 442 cells/uL (ref 200–950)
Basophils Absolute: 19 cells/uL (ref 0–200)
Basophils Relative: 0.4 %
Eosinophils Absolute: 173 cells/uL (ref 15–500)
Eosinophils Relative: 3.6 %
HCT: 41.6 % (ref 35.0–45.0)
Hemoglobin: 13.6 g/dL (ref 11.7–15.5)
Lymphs Abs: 1555 cells/uL (ref 850–3900)
MCH: 26.6 pg — ABNORMAL LOW (ref 27.0–33.0)
MCHC: 32.7 g/dL (ref 32.0–36.0)
MCV: 81.3 fL (ref 80.0–100.0)
MPV: 10.6 fL (ref 7.5–12.5)
Monocytes Relative: 9.2 %
Neutro Abs: 2611 cells/uL (ref 1500–7800)
Neutrophils Relative %: 54.4 %
Platelets: 248 10*3/uL (ref 140–400)
RBC: 5.12 10*6/uL — ABNORMAL HIGH (ref 3.80–5.10)
RDW: 13.4 % (ref 11.0–15.0)
Total Lymphocyte: 32.4 %
WBC: 4.8 10*3/uL (ref 3.8–10.8)

## 2020-02-11 LAB — TSH: TSH: 1.65 mIU/L

## 2020-02-11 NOTE — Assessment & Plan Note (Signed)
Given samples of Ubrelvy, this will not interact with her current meds Will see how many she requires  topamax interacts with her Rexulti

## 2020-02-11 NOTE — Assessment & Plan Note (Signed)
Followed by psychiatry, has been stable on meds

## 2020-02-11 NOTE — Assessment & Plan Note (Signed)
Prn pain meds, occ use of gabapentin

## 2020-02-13 ENCOUNTER — Other Ambulatory Visit: Payer: Self-pay | Admitting: *Deleted

## 2020-02-13 DIAGNOSIS — R7989 Other specified abnormal findings of blood chemistry: Secondary | ICD-10-CM

## 2020-02-13 DIAGNOSIS — E782 Mixed hyperlipidemia: Secondary | ICD-10-CM

## 2020-02-13 MED ORDER — UBRELVY 100 MG PO TABS: ORAL_TABLET | ORAL | 1 refills | Status: DC

## 2020-02-13 MED ORDER — FENOFIBRATE 48 MG PO TABS: 48.0000 mg | ORAL_TABLET | Freq: Every day | ORAL | 1 refills | Status: DC

## 2020-02-14 ENCOUNTER — Telehealth: Payer: Self-pay | Admitting: *Deleted

## 2020-02-14 NOTE — Telephone Encounter (Signed)
Received request from pharmacy for [place] on Ubrelvy.  PA submitted.   Dx: [id number].  Your information has been sent to [place].

## 2020-02-15 ENCOUNTER — Other Ambulatory Visit: Payer: Self-pay

## 2020-02-15 ENCOUNTER — Encounter (INDEPENDENT_AMBULATORY_CARE_PROVIDER_SITE_OTHER): Payer: 59 | Admitting: Ophthalmology

## 2020-02-15 DIAGNOSIS — H538 Other visual disturbances: Secondary | ICD-10-CM

## 2020-02-15 DIAGNOSIS — H43813 Vitreous degeneration, bilateral: Secondary | ICD-10-CM

## 2020-02-15 LAB — PAP IG W/ RFLX HPV ASCU

## 2020-02-15 LAB — HUMAN PAPILLOMAVIRUS, HIGH RISK: HPV DNA High Risk: NOT DETECTED

## 2020-02-16 MED ORDER — UBRELVY 100 MG PO TABS: ORAL_TABLET | ORAL | 1 refills | Status: DC

## 2020-02-16 NOTE — Telephone Encounter (Signed)
Received PA determination.   PA 9710 approved 02/16/2020- 08/16/2020.  Pharmacy made aware.

## 2020-02-23 MED FILL — buPROPion HCL ER (XL) 150 M: 150 | 30 days supply | Qty: 30 | Fill #2

## 2020-02-23 MED FILL — VENLAFAXINE HCL ER 75 MG CA: 75 | 30 days supply | Qty: 30 | Fill #2

## 2020-03-09 ENCOUNTER — Encounter: Payer: Self-pay | Admitting: Family Medicine

## 2020-03-14 NOTE — Telephone Encounter (Signed)
Received message from patient.   Reports that swelling in legs did resolve after (1) day. Reports that she did not stop medication .   MD made aware.

## 2020-03-23 DIAGNOSIS — F411 Generalized anxiety disorder: Secondary | ICD-10-CM | POA: Diagnosis not present

## 2020-03-23 DIAGNOSIS — F319 Bipolar disorder, unspecified: Secondary | ICD-10-CM | POA: Diagnosis not present

## 2020-03-23 DIAGNOSIS — F902 Attention-deficit hyperactivity disorder, combined type: Secondary | ICD-10-CM | POA: Diagnosis not present

## 2020-03-23 MED FILL — VENLAFAXINE HCL ER 75 MG CA: 75 | 30 days supply | Qty: 30 | Fill #0

## 2020-03-23 MED FILL — VENLAFAXINE HCL ER 37.5 MG: 37.5 | 30 days supply | Qty: 30 | Fill #0

## 2020-03-23 MED FILL — buPROPion HCL ER (XL) 150 M: 150 | 30 days supply | Qty: 30 | Fill #0

## 2020-03-23 MED FILL — busPIRone HCL 10 MG TABS: 10 | 30 days supply | Qty: 60 | Fill #0

## 2020-03-23 MED FILL — DEXTROAMP-AMPHETAMIN 20 MG: 20 | 30 days supply | Qty: 45 | Fill #0

## 2020-04-10 MED FILL — VENLAFAXINE HCL ER 150 MG C: 150 | 30 days supply | Qty: 30 | Fill #0

## 2020-04-19 MED FILL — DEXTROAMP-AMPHETAMIN 20 MG: 20 | 30 days supply | Qty: 45 | Fill #0

## 2020-04-30 MED FILL — buPROPion HCL ER (XL) 150 M: 150 | 30 days supply | Qty: 30 | Fill #1

## 2020-05-01 DIAGNOSIS — M5126 Other intervertebral disc displacement, lumbar region: Secondary | ICD-10-CM | POA: Diagnosis not present

## 2020-05-05 DIAGNOSIS — H5213 Myopia, bilateral: Secondary | ICD-10-CM | POA: Diagnosis not present

## 2020-05-10 MED FILL — VENLAFAXINE HCL ER 150 MG C: 150 | 30 days supply | Qty: 30 | Fill #1

## 2020-05-17 MED FILL — DEXTROAMP-AMPHETAMIN 20 MG: 20 | 30 days supply | Qty: 45 | Fill #0

## 2020-05-25 ENCOUNTER — Other Ambulatory Visit: Payer: Self-pay

## 2020-05-25 ENCOUNTER — Ambulatory Visit
Admission: EM | Admit: 2020-05-25 | Discharge: 2020-05-25 | Disposition: A | Discharge status: Home / Self Care | Payer: 59 | Attending: Emergency Medicine | Admitting: Emergency Medicine

## 2020-05-25 DIAGNOSIS — Z1152 Encounter for screening for COVID-19: Secondary | ICD-10-CM | POA: Diagnosis not present

## 2020-05-25 DIAGNOSIS — Z20822 Contact with and (suspected) exposure to covid-19: Secondary | ICD-10-CM

## 2020-05-25 DIAGNOSIS — J029 Acute pharyngitis, unspecified: Secondary | ICD-10-CM

## 2020-05-25 DIAGNOSIS — J069 Acute upper respiratory infection, unspecified: Secondary | ICD-10-CM

## 2020-05-25 MED ORDER — CETIRIZINE HCL 10 MG PO TABS: 10.0000 mg | ORAL_TABLET | Freq: Every day | ORAL | 0 refills | Status: DC

## 2020-05-25 MED ORDER — FLUTICASONE PROPIONATE 50 MCG/ACT NA SUSP: 2.0000 | Freq: Every day | NASAL | 0 refills | Status: DC

## 2020-05-25 NOTE — ED Provider Notes (Signed)
[facility]   [id number] 05/25/20 Arrival Time: 9030   CC: COVID symptoms  SUBJECTIVE: History from: patient.  [name] is a 39 y.o. female who presents with nasal congestion, runny nose, scratchy throat and drainage x 3 days.  Denies sick exposure to COVID, flu or strep.  Has tried OTC medications with minimal relief.  Symptoms are made worse with clearing throat.  Denies previous COVID infection in the past.  Has received both of her COVID vaccines.   Denies fever, chills, SOB, wheezing, chest pain, nausea, vomiting, changes in bowel or bladder habits.     ROS: As per HPI.  All other pertinent ROS negative.     Past Medical History:  Diagnosis Date  . 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 Bipolar 1 disorder, mixed ([place])   . Bipolar disorder, mixed ([place])   . Depression   . Hair pulling   . HSIL on Pap smear of cervix 2013   [place]   Past Surgical History:  Procedure Laterality Date  . CHOLECYSTECTOMY    . LAPAROSCOPIC APPENDECTOMY N/A 05/03/2018   Procedure: APPENDECTOMY LAPAROSCOPIC;  Surgeon: Greer Pickerel, MD;  Location: [facility];  Service: General;  Laterality: N/A;  . TUBAL LIGATION     Allergies  Allergen Reactions  . Lamictal [Lamotrigine]     Knots in throat and back of head  . Prozac [Fluoxetine Hcl] Other (See Comments)    Blurry vision   No current facility-administered medications on file prior to encounter.   Current Outpatient Medications on File Prior to Encounter  Medication Sig Dispense Refill  . amphetamine-dextroamphetamine (ADDERALL) 15 MG tablet Take 1 tablet by mouth daily. 30 tablet 0  . buPROPion (WELLBUTRIN XL) 150 MG 24 hr tablet Take 150 mg by mouth daily.    . fenofibrate (TRICOR) 48 MG tablet Take 1 tablet (48 mg total) by mouth daily. 90 tablet 1  . gabapentin (NEURONTIN) 300 MG capsule Take 300 mg by mouth daily.    Marland Kitchen HYDROcodone-acetaminophen (NORCO) 5-325 MG tablet Take 1 tablet by mouth  3 (three) times daily as needed for severe pain. 20 tablet 0  . meloxicam (MOBIC) 7.5 MG tablet Take 1 tablet (7.5 mg total) by mouth daily. 30 tablet 2  . olopatadine (PATADAY) 0.1 % ophthalmic solution Place 1 drop into both eyes 2 (two) times daily. 5 mL 12  . REXULTI 1 MG TABS tablet Take 1 mg by mouth daily.    Marland Kitchen Ubrogepant (UBRELVY) 100 MG TABS Take 1/2 to 1 tablet at onset of migraine 10 tablet 1  . venlafaxine XR (EFFEXOR-XR) 75 MG 24 hr capsule      Social History   Socioeconomic History  . Marital status: Married    Spouse name: Not on file  . Number of children: Not on file  . Years of education: Not on file  . Highest education level: Not on file  Occupational History  . Not on file  Tobacco Use  . Smoking status: Former Smoker    Packs/day: 0.50    Types: Cigarettes    Quit date: 06/08/2019    Years since quitting: 0.9  . Smokeless tobacco: Never Used  Vaping Use  . Vaping Use: Never used  Substance and Sexual Activity  . Alcohol use: Yes    Alcohol/week: 0.0 standard drinks    Comment: socially only less than monthly  . Drug use: No  . Sexual activity:  Yes  Other Topics Concern  . Not on file  Social History Narrative  . Not on file   Social Determinants of Health   Financial Resource Strain:   . Difficulty of Paying Living Expenses:   Food Insecurity:   . Worried About Charity fundraiser in the Last Year:   . Arboriculturist in the Last Year:   Transportation Needs:   . Film/video editor (Medical):   Marland Kitchen Lack of Transportation (Non-Medical):   Physical Activity:   . Days of Exercise per Week:   . Minutes of Exercise per Session:   Stress:   . Feeling of Stress :   Social Connections:   . Frequency of Communication with Friends and Family:   . Frequency of Social Gatherings with Friends and Family:   . Attends Religious Services:   . Active Member of Clubs or Organizations:   . Attends Archivist Meetings:   Marland Kitchen Marital Status:     Intimate Partner Violence:   . Fear of Current or Ex-Partner:   . Emotionally Abused:   Marland Kitchen Physically Abused:   . Sexually Abused:    Family History  Problem Relation Age of Onset  . Cancer Mother        lung  . Heart disease Mother   . Alcohol abuse Mother   . Bipolar disorder Mother   . Cancer Father        prostate     OBJECTIVE:  Vitals:   05/25/20 0858  BP: 102/74  Pulse: (!) 118  Resp: 20  Temp: 98.5 F (36.9 C)  SpO2: 97%     General appearance: alert; appears mildly fatigued, but nontoxic; speaking in full sentences and tolerating own secretions HEENT: NCAT; Ears: EACs clear, TMs pearly gray; Eyes: PERRL.  EOM grossly intact. Nose: nares patent without rhinorrhea, Throat: oropharynx clear, tonsils non erythematous or enlarged, uvula midline  Neck: supple without LAD Lungs: unlabored respirations, symmetrical air entry; cough: absent; no respiratory distress; CTAB Heart: regular rate and rhythm.   Skin: warm and dry Psychological: alert and cooperative; normal mood and affect  ASSESSMENT & PLAN:  1. Encounter for screening for COVID-19   2. Sore throat   3. Viral URI   4. Suspected COVID-19 virus infection     Meds ordered this encounter  Medications  . cetirizine (ZYRTEC) 10 MG tablet    Sig: Take 1 tablet (10 mg total) by mouth daily.    Dispense:  30 tablet    Refill:  0    Order Specific Question:   Supervising Provider    Answer:   Raylene Everts [[id number]]  . fluticasone (FLONASE) 50 MCG/ACT nasal spray    Sig: Place 2 sprays into both nostrils daily.    Dispense:  16 g    Refill:  0    Order Specific Question:   Supervising Provider    Answer:   Raylene Everts [[id number]]    COVID testing ordered.  It will take between 2-5 days for test results.  Someone will contact you regarding abnormal results.    In the meantime: You should remain isolated in your home for 10 days from symptom onset AND greater than 72 hours after symptoms  resolution (absence of fever without the use of fever-reducing medication and improvement in respiratory symptoms), whichever is longer Get plenty of rest and push fluids Zyrtec for nasal congestion, runny nose, and/or sore throat Flonase for nasal congestion and runny nose  Use medications daily for symptom relief Use OTC medications like ibuprofen or tylenol as needed fever or pain Call or go to the ED if you have any new or worsening symptoms such as fever, worsening cough, shortness of breath, chest tightness, chest pain, turning blue, changes in mental status, etc...   Reviewed expectations re: course of current medical issues. Questions answered. Outlined signs and symptoms indicating need for more acute intervention. Patient verbalized understanding. After Visit Summary given.         Stacey Drain [place], PA-C 05/25/20 [phone]

## 2020-05-25 NOTE — Discharge Instructions (Signed)
COVID testing ordered.  It will take between 2-5 days for test results.  Someone will contact you regarding abnormal results.    In the meantime: You should remain isolated in your home for 10 days from symptom onset AND greater than 72 hours after symptoms resolution (absence of fever without the use of fever-reducing medication and improvement in respiratory symptoms), whichever is longer Get plenty of rest and push fluids Zyrtec for nasal congestion, runny nose, and/or sore throat Flonase for nasal congestion and runny nose Use medications daily for symptom relief Use OTC medications like ibuprofen or tylenol as needed fever or pain Call or go to the ED if you have any new or worsening symptoms such as fever, worsening cough, shortness of breath, chest tightness, chest pain, turning blue, changes in mental status, etc..Marland Kitchen

## 2020-05-25 NOTE — ED Triage Notes (Signed)
Pt presents with c/o scratchy throat and sinus drainage that began on Tuesday

## 2020-05-26 LAB — SARS-COV-2, NAA 2 DAY TAT

## 2020-05-26 LAB — NOVEL CORONAVIRUS, NAA: SARS-CoV-2, NAA: NOT DETECTED

## 2020-06-06 MED FILL — VENLAFAXINE HCL ER 150 MG C: 150 | 30 days supply | Qty: 30 | Fill #2

## 2020-06-06 MED FILL — buPROPion HCL ER (XL) 150 M: 150 | 30 days supply | Qty: 30 | Fill #2

## 2020-06-15 DIAGNOSIS — F319 Bipolar disorder, unspecified: Secondary | ICD-10-CM | POA: Diagnosis not present

## 2020-06-15 DIAGNOSIS — F411 Generalized anxiety disorder: Secondary | ICD-10-CM | POA: Diagnosis not present

## 2020-06-15 DIAGNOSIS — F902 Attention-deficit hyperactivity disorder, combined type: Secondary | ICD-10-CM | POA: Diagnosis not present

## 2020-07-05 ENCOUNTER — Other Ambulatory Visit (HOSPITAL_COMMUNITY): Payer: Self-pay | Admitting: Psychiatry

## 2020-07-05 MED FILL — ARIPiprazole 2 MG TABS: 2 | 30 days supply | Qty: 30 | Fill #0

## 2020-07-09 MED FILL — buPROPion HCL ER (XL) 150 M: 150 | 30 days supply | Qty: 30 | Fill #2

## 2020-07-19 ENCOUNTER — Other Ambulatory Visit: Payer: Self-pay | Admitting: Student

## 2020-07-19 ENCOUNTER — Other Ambulatory Visit (HOSPITAL_COMMUNITY): Payer: Self-pay | Admitting: Student

## 2020-07-19 DIAGNOSIS — M5416 Radiculopathy, lumbar region: Secondary | ICD-10-CM | POA: Diagnosis not present

## 2020-07-25 DIAGNOSIS — J0100 Acute maxillary sinusitis, unspecified: Secondary | ICD-10-CM | POA: Diagnosis not present

## 2020-07-25 DIAGNOSIS — R059 Cough, unspecified: Secondary | ICD-10-CM | POA: Diagnosis not present

## 2020-07-26 ENCOUNTER — Encounter: Payer: Self-pay | Admitting: Family Medicine

## 2020-07-27 ENCOUNTER — Telehealth: Payer: Self-pay | Admitting: *Deleted

## 2020-07-27 MED ORDER — HYDROCOD POLST-CPM POLST ER 10-8 MG/5ML PO SUER: 5.0000 mL | Freq: Two times a day (BID) | ORAL | 0 refills | Status: DC | PRN

## 2020-07-27 NOTE — Telephone Encounter (Signed)
Received call from patient.   Reports that she was diagnosed with COVID on 07/25/2020. Reports that she was given Prednisone and Z Pack. States that she is having increased coughing at night and requested prescription.   MD please advise.

## 2020-07-28 ENCOUNTER — Other Ambulatory Visit: Payer: Self-pay | Admitting: Unknown Physician Specialty

## 2020-07-28 ENCOUNTER — Telehealth: Payer: Self-pay | Admitting: Unknown Physician Specialty

## 2020-07-28 DIAGNOSIS — E663 Overweight: Secondary | ICD-10-CM

## 2020-07-28 DIAGNOSIS — U071 COVID-19: Secondary | ICD-10-CM

## 2020-07-28 NOTE — Telephone Encounter (Signed)
I connected by phone with [name] on 07/28/2020 at 11:59 AM to discuss the potential use of a new treatment for mild to moderate COVID-19 viral infection in non-hospitalized patients.  This patient is a 39 y.o. female that meets the FDA criteria for Emergency Use Authorization of COVID monoclonal antibody casirivimab/imdevimab or bamlanivimab/eteseviamb.  Has a (+) direct SARS-CoV-2 viral test result  Has mild or moderate COVID-19   Is NOT hospitalized due to COVID-19  Is within 10 days of symptom onset  Has at least one of the high risk factor(s) for progression to severe COVID-19 and/or hospitalization as defined in EUA.  Specific high risk criteria : BMI > 25   I have spoken and communicated the following to the patient or parent/caregiver regarding COVID monoclonal antibody treatment:  1. FDA has authorized the emergency use for the treatment of mild to moderate COVID-19 in adults and pediatric patients with positive results of direct SARS-CoV-2 viral testing who are 36 years of age and older weighing at least 40 kg, and who are at high risk for progressing to severe COVID-19 and/or hospitalization.  2. The significant known and potential risks and benefits of COVID monoclonal antibody, and the extent to which such potential risks and benefits are unknown.  3. Information on available alternative treatments and the risks and benefits of those alternatives, including clinical trials.  4. Patients treated with COVID monoclonal antibody should continue to self-isolate and use infection control measures (e.g., wear mask, isolate, social distance, avoid sharing personal items, clean and disinfect "high touch" surfaces, and frequent handwashing) according to CDC guidelines.   5. The patient or parent/caregiver has the option to accept or refuse COVID monoclonal antibody treatment.  After reviewing this information with the patient, the patient has agreed to receive one of the  available covid 19 monoclonal antibodies and will be provided an appropriate fact sheet prior to infusion. [name], NP 07/28/2020 11:59 AM  Sx onset 10/5

## 2020-07-29 ENCOUNTER — Ambulatory Visit (HOSPITAL_COMMUNITY)
Admission: RE | Admit: 2020-07-29 | Discharge: 2020-07-29 | Disposition: A | Discharge status: Home / Self Care | Payer: 59 | Source: Ambulatory Visit | Attending: Pulmonary Disease | Admitting: Pulmonary Disease

## 2020-07-29 DIAGNOSIS — U071 COVID-19: Secondary | ICD-10-CM | POA: Diagnosis not present

## 2020-07-29 DIAGNOSIS — E663 Overweight: Secondary | ICD-10-CM | POA: Diagnosis present

## 2020-07-29 MED ORDER — SODIUM CHLORIDE 0.9 % IV SOLN: INTRAVENOUS | Status: DC | PRN

## 2020-07-29 MED ORDER — DIPHENHYDRAMINE HCL 50 MG/ML IJ SOLN: 50.0000 mg | Freq: Once | INTRAMUSCULAR | Status: DC | PRN

## 2020-07-29 MED ORDER — EPINEPHRINE 0.3 MG/0.3ML IJ SOAJ: 0.3000 mg | Freq: Once | INTRAMUSCULAR | Status: DC | PRN

## 2020-07-29 MED ORDER — FAMOTIDINE IN NACL 20-0.9 MG/50ML-% IV SOLN: 20.0000 mg | Freq: Once | INTRAVENOUS | Status: DC | PRN

## 2020-07-29 MED ORDER — SODIUM CHLORIDE 0.9 % IV SOLN: Freq: Once | INTRAVENOUS | Status: AC

## 2020-07-29 MED ORDER — ALBUTEROL SULFATE HFA 108 (90 BASE) MCG/ACT IN AERS: 2.0000 | INHALATION_SPRAY | Freq: Once | RESPIRATORY_TRACT | Status: DC | PRN

## 2020-07-29 MED ORDER — METHYLPREDNISOLONE SODIUM SUCC 125 MG IJ SOLR: 125.0000 mg | Freq: Once | INTRAMUSCULAR | Status: DC | PRN

## 2020-07-29 NOTE — Discharge Instructions (Signed)

## 2020-07-29 NOTE — Progress Notes (Signed)
Patient ID: [name], female   DOB: December 30, 1980, 39 y.o.   MRN: [id number]  Diagnosis: COVID-19  Physician: Dr. Joya Gaskins  Procedure: [facility] Med: bamlanivimab\etesevimab infusion - Provided patient with bamlanimivab\etesevimab fact sheet for patients, parents and caregivers prior to infusion.  Complications: No immediate complications noted.  Discharge: Discharged home   [name] 07/29/2020

## 2020-08-01 DIAGNOSIS — H9312 Tinnitus, left ear: Secondary | ICD-10-CM | POA: Diagnosis not present

## 2020-08-01 DIAGNOSIS — U071 COVID-19: Secondary | ICD-10-CM | POA: Diagnosis not present

## 2020-08-01 DIAGNOSIS — R42 Dizziness and giddiness: Secondary | ICD-10-CM | POA: Diagnosis not present

## 2020-08-01 DIAGNOSIS — H938X2 Other specified disorders of left ear: Secondary | ICD-10-CM | POA: Diagnosis not present

## 2020-08-02 ENCOUNTER — Ambulatory Visit (HOSPITAL_COMMUNITY): Payer: 59

## 2020-08-08 ENCOUNTER — Encounter: Payer: Self-pay | Admitting: Family Medicine

## 2020-08-09 ENCOUNTER — Ambulatory Visit (HOSPITAL_COMMUNITY)
Admission: RE | Admit: 2020-08-09 | Discharge: 2020-08-09 | Disposition: A | Discharge status: Home / Self Care | Payer: 59 | Source: Ambulatory Visit | Attending: Student | Admitting: Student

## 2020-08-09 ENCOUNTER — Other Ambulatory Visit: Payer: Self-pay

## 2020-08-09 DIAGNOSIS — M5416 Radiculopathy, lumbar region: Secondary | ICD-10-CM | POA: Insufficient documentation

## 2020-08-09 DIAGNOSIS — M545 Low back pain, unspecified: Secondary | ICD-10-CM | POA: Diagnosis not present

## 2020-08-09 MED ORDER — GADOBUTROL 1 MMOL/ML IV SOLN
10.0000 mL | Freq: Once | INTRAVENOUS | Status: AC | PRN
  Administered 2020-08-09: 10 mL via INTRAVENOUS

## 2020-08-13 ENCOUNTER — Encounter: Payer: Self-pay | Admitting: Family Medicine

## 2020-08-13 ENCOUNTER — Telehealth: Payer: Self-pay | Admitting: Family Medicine

## 2020-08-13 NOTE — Telephone Encounter (Signed)
Received fax from covermymeds needing a prior auth on Ubrogepant (UBRELVY) 100 MG TABS Key [id number]  [phone]

## 2020-08-17 ENCOUNTER — Encounter: Payer: Self-pay | Admitting: Family Medicine

## 2020-08-17 DIAGNOSIS — E785 Hyperlipidemia, unspecified: Secondary | ICD-10-CM | POA: Insufficient documentation

## 2020-08-17 DIAGNOSIS — E782 Mixed hyperlipidemia: Secondary | ICD-10-CM

## 2020-08-17 MED ORDER — ROSUVASTATIN CALCIUM 10 MG PO TABS: 10.0000 mg | ORAL_TABLET | Freq: Every day | ORAL | 3 refills | Status: DC

## 2020-08-20 NOTE — Telephone Encounter (Signed)
PA submitted and approved.

## 2020-08-28 MED FILL — ARIPiprazole 2 MG TABS: 2 | 30 days supply | Qty: 30 | Fill #1

## 2020-09-04 DIAGNOSIS — M5416 Radiculopathy, lumbar region: Secondary | ICD-10-CM | POA: Diagnosis not present

## 2020-09-04 DIAGNOSIS — R030 Elevated blood-pressure reading, without diagnosis of hypertension: Secondary | ICD-10-CM | POA: Diagnosis not present

## 2020-09-04 DIAGNOSIS — Z6830 Body mass index (BMI) 30.0-30.9, adult: Secondary | ICD-10-CM | POA: Diagnosis not present

## 2020-09-11 DIAGNOSIS — F411 Generalized anxiety disorder: Secondary | ICD-10-CM | POA: Diagnosis not present

## 2020-09-11 DIAGNOSIS — F902 Attention-deficit hyperactivity disorder, combined type: Secondary | ICD-10-CM | POA: Diagnosis not present

## 2020-09-11 DIAGNOSIS — F319 Bipolar disorder, unspecified: Secondary | ICD-10-CM | POA: Diagnosis not present

## 2020-09-12 ENCOUNTER — Encounter: Payer: Self-pay | Admitting: Family Medicine

## 2020-09-12 NOTE — Telephone Encounter (Signed)
Message sent thru MyChart to see if pt would like to been seen for her pain/discomfort

## 2020-09-17 ENCOUNTER — Other Ambulatory Visit: Payer: Self-pay

## 2020-09-17 ENCOUNTER — Encounter: Payer: Self-pay | Admitting: Family Medicine

## 2020-09-17 ENCOUNTER — Ambulatory Visit (INDEPENDENT_AMBULATORY_CARE_PROVIDER_SITE_OTHER): Payer: 59 | Admitting: Family Medicine

## 2020-09-17 VITALS — BP 112/64 | HR 100 | Temp 98.2°F | Resp 14 | Ht 64.5 in | Wt 177.0 lb

## 2020-09-17 DIAGNOSIS — R1011 Right upper quadrant pain: Secondary | ICD-10-CM | POA: Diagnosis not present

## 2020-09-17 DIAGNOSIS — R1901 Right upper quadrant abdominal swelling, mass and lump: Secondary | ICD-10-CM

## 2020-09-17 NOTE — Progress Notes (Signed)
   Subjective:    Patient ID: [name], female    DOB: 01-19-1981, 39 y.o.   MRN: [id number]  Patient presents for Edema (local area of swelling under R side rib cage- slight discomfort)  Pt here wiht fullness beneath right rib cage for the past month  she had a picture showed a prominent swelling almost baseball sized.  She states that she gets a discomfort in the area.  No nausea or vomiting associated no change in size with eating or bowel movements.  Sometimes he does not look as prominent other times it pops out even more.  Over the past few days however she has had increased discomfort.  She did have a CT scan back in 2019 which read and reviewed there is mention of a tiny ventral hernia but location was not noted she has appendicitis at this time.  She has had gallbladder removed and has port scars   No weight loss, weight up 7lbs   Review Of Systems:  GEN- denies fatigue, fever, weight loss,weakness, recent illness HEENT- denies eye drainage, change in vision, nasal discharge, CVS- denies chest pain, palpitations RESP- denies SOB, cough, wheeze ABD- denies N/V, change in stools, abd pain GU- denies dysuria, hematuria, dribbling, incontinence MSK- denies joint pain, muscle aches, injury Neuro- denies headache, dizziness, syncope, seizure activity       Objective:    BP 112/64   Pulse 100   Temp 98.2 F (36.8 C) (Temporal)   Resp 14   Ht 5' 4.5" (1.638 m)   Wt 177 lb (80.3 kg)   SpO2 99%   BMI 29.91 kg/m  GEN- NAD, alert and oriented x3 HEENT- PERRL, EOMI, non injected sclera, pink conjunctiva, MMM, oropharynx clear CVS- RRR, no murmur RESP-CTAB ABD-NABS,soft,soft tissue mass palpated RUQ beneath ribs, mild TTP, no other mass/lesions noted  EXT- No edema Pulses- Radial, 2+         Assessment & Plan:      Problem List Items Addressed This Visit    None    Visit Diagnoses    RUQ abdominal mass    -  Primary   Obtain CT abd, DD hernia, lipoma, other  soft tissue mass, doubt abcess based on presentation. due to pain would need surgical intervention  Based on previous surgeries I dont think [place] if best option, proceed with CT       Note: This dictation was prepared with Dragon dictation along with smaller phrase technology. Any transcriptional errors that result from this process are unintentional.

## 2020-09-17 NOTE — Patient Instructions (Addendum)
CT abdomen to be done  F/u pending results

## 2020-09-18 ENCOUNTER — Ambulatory Visit (HOSPITAL_COMMUNITY)
Admission: RE | Admit: 2020-09-18 | Discharge: 2020-09-18 | Disposition: A | Discharge status: Home / Self Care | Payer: 59 | Source: Ambulatory Visit | Attending: Family Medicine | Admitting: Family Medicine

## 2020-09-18 ENCOUNTER — Encounter: Payer: Self-pay | Admitting: Family Medicine

## 2020-09-18 DIAGNOSIS — R109 Unspecified abdominal pain: Secondary | ICD-10-CM | POA: Diagnosis not present

## 2020-09-18 DIAGNOSIS — R1011 Right upper quadrant pain: Secondary | ICD-10-CM | POA: Insufficient documentation

## 2020-09-18 DIAGNOSIS — R1901 Right upper quadrant abdominal swelling, mass and lump: Secondary | ICD-10-CM | POA: Insufficient documentation

## 2020-09-18 MED ORDER — IOHEXOL 300 MG/ML  SOLN
100.0000 mL | Freq: Once | INTRAMUSCULAR | Status: AC | PRN
  Administered 2020-09-18: 100 mL via INTRAVENOUS

## 2020-09-21 ENCOUNTER — Ambulatory Visit: Payer: 59 | Admitting: Family Medicine

## 2020-10-03 ENCOUNTER — Other Ambulatory Visit: Payer: Self-pay | Admitting: Family Medicine

## 2020-10-03 ENCOUNTER — Other Ambulatory Visit: Payer: Self-pay | Admitting: *Deleted

## 2020-10-03 MED ORDER — FENOFIBRATE 48 MG PO TABS
48.0000 mg | ORAL_TABLET | Freq: Every day | ORAL | 1 refills | Status: DC
Start: 2020-10-03 — End: 2020-10-03

## 2020-10-03 MED ORDER — ROSUVASTATIN CALCIUM 10 MG PO TABS
10.0000 mg | ORAL_TABLET | Freq: Every day | ORAL | 3 refills | Status: DC
Start: 2020-10-03 — End: 2020-10-03

## 2020-10-03 MED FILL — FENOFIBRATE 48 MG TABS: 48 | 90 days supply | Qty: 90 | Fill #0

## 2020-10-03 MED FILL — ROSUVASTATIN CALCIUM 10 MG: 10 | 90 days supply | Qty: 90 | Fill #0

## 2020-11-13 ENCOUNTER — Other Ambulatory Visit: Payer: Self-pay | Admitting: *Deleted

## 2020-11-13 ENCOUNTER — Encounter: Payer: Self-pay | Admitting: Family Medicine

## 2020-11-13 DIAGNOSIS — E782 Mixed hyperlipidemia: Secondary | ICD-10-CM

## 2020-11-13 DIAGNOSIS — R7989 Other specified abnormal findings of blood chemistry: Secondary | ICD-10-CM

## 2020-11-13 DIAGNOSIS — R1011 Right upper quadrant pain: Secondary | ICD-10-CM

## 2020-11-14 ENCOUNTER — Other Ambulatory Visit: Payer: Self-pay | Admitting: *Deleted

## 2020-11-14 DIAGNOSIS — R1011 Right upper quadrant pain: Secondary | ICD-10-CM | POA: Diagnosis not present

## 2020-11-14 DIAGNOSIS — E782 Mixed hyperlipidemia: Secondary | ICD-10-CM

## 2020-11-14 DIAGNOSIS — R7989 Other specified abnormal findings of blood chemistry: Secondary | ICD-10-CM

## 2020-11-15 ENCOUNTER — Encounter: Payer: Self-pay | Admitting: Family Medicine

## 2020-11-16 ENCOUNTER — Encounter: Payer: Self-pay | Admitting: Family Medicine

## 2020-11-20 ENCOUNTER — Encounter: Payer: Self-pay | Admitting: Orthopedic Surgery

## 2020-11-20 ENCOUNTER — Ambulatory Visit: Payer: 59 | Admitting: Orthopedic Surgery

## 2020-11-20 ENCOUNTER — Ambulatory Visit: Payer: 59

## 2020-11-20 ENCOUNTER — Other Ambulatory Visit: Payer: Self-pay

## 2020-11-20 VITALS — BP 126/84 | HR 108 | Ht 64.5 in | Wt 175.0 lb

## 2020-11-20 DIAGNOSIS — M7671 Peroneal tendinitis, right leg: Secondary | ICD-10-CM

## 2020-11-20 DIAGNOSIS — M25571 Pain in right ankle and joints of right foot: Secondary | ICD-10-CM
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
## 2020-11-21 ENCOUNTER — Telehealth: Payer: 59 | Admitting: Family

## 2020-11-21 DIAGNOSIS — R399 Unspecified symptoms and signs involving the genitourinary system: Secondary | ICD-10-CM

## 2020-11-21 MED ORDER — CEPHALEXIN 500 MG PO CAPS: 500.0000 mg | ORAL_CAPSULE | Freq: Two times a day (BID) | ORAL | 0 refills | Status: DC

## 2020-11-21 NOTE — Progress Notes (Signed)

## 2020-11-22 ENCOUNTER — Other Ambulatory Visit (HOSPITAL_COMMUNITY): Payer: Self-pay | Admitting: Psychiatry

## 2020-11-22 DIAGNOSIS — F902 Attention-deficit hyperactivity disorder, combined type: Secondary | ICD-10-CM | POA: Diagnosis not present

## 2020-11-22 DIAGNOSIS — F411 Generalized anxiety disorder: Secondary | ICD-10-CM | POA: Diagnosis not present

## 2020-11-22 DIAGNOSIS — F319 Bipolar disorder, unspecified: Secondary | ICD-10-CM | POA: Diagnosis not present

## 2020-11-22 MED FILL — ARIPIPRAZOLE 2 MG TABS: 2 | 30 days supply | Qty: 30 | Fill #0

## 2020-11-22 MED FILL — FLUoxetine HCL 40 MG CAPS: 40 | 30 days supply | Qty: 30 | Fill #0

## 2020-11-22 MED FILL — busPIRone HCL 10 MG TABS: 10 | 30 days supply | Qty: 60 | Fill #0

## 2020-11-22 MED FILL — buPROPion HCL ER (XL) 150 M: 150 | 30 days supply | Qty: 30 | Fill #0

## 2020-11-23 ENCOUNTER — Ambulatory Visit: Payer: 59 | Admitting: Podiatry

## 2020-12-14 ENCOUNTER — Other Ambulatory Visit (HOSPITAL_COMMUNITY): Payer: Self-pay | Admitting: Neurosurgery

## 2020-12-14 DIAGNOSIS — M5416 Radiculopathy, lumbar region: Secondary | ICD-10-CM | POA: Diagnosis not present

## 2020-12-14 DIAGNOSIS — M5030 Other cervical disc degeneration, unspecified cervical region: Secondary | ICD-10-CM | POA: Diagnosis not present

## 2020-12-14 DIAGNOSIS — M5137 Other intervertebral disc degeneration, lumbosacral region: Secondary | ICD-10-CM | POA: Diagnosis not present

## 2020-12-14 MED FILL — diazePAM 5 MG TABS: 5 | 1 days supply | Qty: 1 | Fill #0

## 2020-12-14 MED FILL — HYDROCODON-APAP 5-325: 5-325 | 27 days supply | Qty: 105 | Fill #0

## 2020-12-14 MED FILL — PREGABALIN 75 MG CAPS: 75 | 30 days supply | Qty: 60 | Fill #0

## 2020-12-20 MED FILL — ARIPIPRAZOLE 2 MG TABS: 2 | 30 days supply | Qty: 30 | Fill #1

## 2020-12-20 MED FILL — FLUoxetine HCL 40 MG CAPS: 40 | 30 days supply | Qty: 30 | Fill #1

## 2020-12-20 MED FILL — buPROPion HCL ER (XL) 150 M: 150 | 30 days supply | Qty: 30 | Fill #1

## 2020-12-31 DIAGNOSIS — M5416 Radiculopathy, lumbar region: Secondary | ICD-10-CM | POA: Diagnosis not present

## 2021-01-10 ENCOUNTER — Other Ambulatory Visit (HOSPITAL_BASED_OUTPATIENT_CLINIC_OR_DEPARTMENT_OTHER): Payer: Self-pay

## 2021-01-11 ENCOUNTER — Other Ambulatory Visit (HOSPITAL_COMMUNITY): Payer: Self-pay | Admitting: Neurosurgery

## 2021-01-11 MED FILL — HYDROCODON-APAP 5-325: 5-325 | 26 days supply | Qty: 105 | Fill #0

## 2021-01-21 ENCOUNTER — Telehealth: Payer: 59 | Admitting: Emergency Medicine

## 2021-01-21 DIAGNOSIS — J0100 Acute maxillary sinusitis, unspecified: Secondary | ICD-10-CM

## 2021-01-21 MED ORDER — FLUTICASONE PROPIONATE 50 MCG/ACT NA SUSP: 2.0000 | Freq: Every day | NASAL | 0 refills | Status: DC

## 2021-01-21 MED ORDER — AMOXICILLIN-POT CLAVULANATE 875-125 MG PO TABS: 1.0000 | ORAL_TABLET | Freq: Two times a day (BID) | ORAL | 0 refills | Status: DC

## 2021-01-21 NOTE — Progress Notes (Signed)
We are sorry that you are not feeling well.  Here is how we plan to help!  Based on what you have shared with me it looks like you have sinusitis.  Sinusitis is inflammation and infection in the sinus cavities of the head.  Based on your presentation I believe you most likely have Acute Bacterial Sinusitis.  This is an infection caused by bacteria and is treated with antibiotics. I have prescribed Augmentin 875mg /125mg  one tablet twice daily with food, for 7 days. I am also prescribing you fluticasone (Flonase) 7mcg/act nasal spray, 2 sprays per nostril daily to help with inflammation and pain in sinuses.  You may use an oral decongestant such as Mucinex D or if you have glaucoma or high blood pressure use plain Mucinex. Saline nasal spray help and can safely be used as often as needed for congestion.  If you develop worsening sinus pain, fever or notice severe headache and vision changes, or if symptoms are not better after completion of antibiotic, please schedule an appointment with a health care provider.    Sinus infections are not as easily transmitted as other respiratory infection, however we still recommend that you avoid close contact with loved ones, especially the very young and elderly.  Remember to wash your hands thoroughly throughout the day as this is the number one way to prevent the spread of infection!  Home Care:  Only take medications as instructed by your medical team.  Complete the entire course of an antibiotic.  Do not take these medications with alcohol.  A steam or ultrasonic humidifier can help congestion.  You can place a towel over your head and breathe in the steam from hot water coming from a faucet.  Avoid close contacts especially the very young and the elderly.  Cover your mouth when you cough or sneeze.  Always remember to wash your hands.  Get Help Right Away If:  You develop worsening fever or sinus pain.  You develop a severe head ache or visual  changes.  Your symptoms persist after you have completed your treatment plan.  Make sure you  Understand these instructions.  Will watch your condition.  Will get help right away if you are not doing well or get worse.  Your e-visit answers were reviewed by a board certified advanced clinical practitioner to complete your personal care plan.  Depending on the condition, your plan could have included both over the counter or prescription medications.  If there is a problem please reply  once you have received a response from your provider.  Your safety is important to [place].  If you have drug allergies check your prescription carefully.    You can use MyChart to ask questions about today's visit, request a non-urgent call back, or ask for a work or school excuse for 24 hours related to this e-Visit. If it has been greater than 24 hours you will need to follow up with your provider, or enter a new e-Visit to address those concerns.  You will get an e-mail in the next two days asking about your experience.  I hope that your e-visit has been valuable and will speed your recovery. Thank you for using e-visits.   Approximately 5 minutes was spent documenting and reviewing patient's chart.

## 2021-01-22 ENCOUNTER — Other Ambulatory Visit (HOSPITAL_COMMUNITY): Payer: Self-pay

## 2021-01-22 DIAGNOSIS — M961 Postlaminectomy syndrome, not elsewhere classified: Secondary | ICD-10-CM | POA: Diagnosis not present

## 2021-01-22 DIAGNOSIS — M5137 Other intervertebral disc degeneration, lumbosacral region: Secondary | ICD-10-CM | POA: Diagnosis not present

## 2021-01-22 MED ORDER — HYDROCODONE-ACETAMINOPHEN 5-325 MG PO TABS
ORAL_TABLET | ORAL | 0 refills | Status: DC
Start: 2021-01-22 — End: 2021-03-07
  Filled 2021-02-08: qty 105, 27d supply, fill #0

## 2021-01-22 MED ORDER — DIAZEPAM 5 MG PO TABS
ORAL_TABLET | ORAL | 0 refills | Status: DC
Start: 2021-01-22 — End: 2021-03-07
  Filled 2021-01-22: qty 1, 1d supply, fill #0

## 2021-01-24 ENCOUNTER — Other Ambulatory Visit (HOSPITAL_COMMUNITY): Payer: Self-pay

## 2021-01-25 ENCOUNTER — Other Ambulatory Visit (HOSPITAL_COMMUNITY): Payer: Self-pay

## 2021-01-25 MED FILL — Fluoxetine HCl Cap 40 MG: ORAL | 30 days supply | Qty: 30 | Fill #0 | Status: AC

## 2021-01-25 MED FILL — Aripiprazole Tab 2 MG: ORAL | 30 days supply | Qty: 30 | Fill #0 | Status: AC

## 2021-01-25 MED FILL — Bupropion HCl Tab ER 24HR 150 MG: ORAL | 30 days supply | Qty: 30 | Fill #0 | Status: AC

## 2021-01-28 ENCOUNTER — Other Ambulatory Visit (HOSPITAL_COMMUNITY): Payer: Self-pay

## 2021-01-28 MED FILL — Amphetamine-Dextroamphetamine Tab 20 MG: ORAL | 30 days supply | Qty: 45 | Fill #0 | Status: AC

## 2021-01-29 ENCOUNTER — Other Ambulatory Visit (HOSPITAL_COMMUNITY): Payer: Self-pay

## 2021-02-08 ENCOUNTER — Other Ambulatory Visit (HOSPITAL_COMMUNITY): Payer: Self-pay

## 2021-02-18 ENCOUNTER — Other Ambulatory Visit (HOSPITAL_COMMUNITY): Payer: Self-pay

## 2021-02-18 DIAGNOSIS — F319 Bipolar disorder, unspecified: Secondary | ICD-10-CM | POA: Diagnosis not present

## 2021-02-18 DIAGNOSIS — F411 Generalized anxiety disorder: Secondary | ICD-10-CM | POA: Diagnosis not present

## 2021-02-18 DIAGNOSIS — F902 Attention-deficit hyperactivity disorder, combined type: Secondary | ICD-10-CM | POA: Diagnosis not present

## 2021-02-18 MED ORDER — BUPROPION HCL ER (XL) 150 MG PO TB24
ORAL_TABLET | ORAL | 2 refills | Status: DC
  Filled 2021-02-18: qty 30, 30d supply, fill #0
  Filled 2021-03-25: qty 30, 30d supply, fill #1
  Filled 2021-04-16: qty 30, 30d supply, fill #2

## 2021-02-18 MED ORDER — AMPHETAMINE-DEXTROAMPHETAMINE 20 MG PO TABS
ORAL_TABLET | ORAL | 0 refills | Status: DC
  Filled 2021-02-25: qty 45, 30d supply, fill #0

## 2021-02-18 MED ORDER — AMPHETAMINE-DEXTROAMPHETAMINE 20 MG PO TABS
ORAL_TABLET | ORAL | 0 refills | Status: DC
Start: 2021-03-21 — End: 2021-11-26
  Filled 2021-03-25: qty 45, 30d supply, fill #0

## 2021-02-18 MED ORDER — FLUOXETINE HCL 40 MG PO CAPS
ORAL_CAPSULE | ORAL | 2 refills | Status: DC
Start: 2021-02-18 — End: 2022-09-29
  Filled 2021-02-18: qty 30, 30d supply, fill #0
  Filled 2021-04-16: qty 30, 30d supply, fill #1

## 2021-02-18 MED ORDER — BUSPIRONE HCL 10 MG PO TABS
ORAL_TABLET | ORAL | 2 refills | Status: DC
Start: 2021-02-18 — End: 2021-03-07
  Filled 2021-02-18: qty 60, 30d supply, fill #0

## 2021-02-18 MED ORDER — AMPHETAMINE-DEXTROAMPHETAMINE 20 MG PO TABS
ORAL_TABLET | ORAL | 0 refills | Status: DC
Start: 2021-04-20 — End: 2022-12-26
  Filled 2021-04-22: qty 45, 30d supply, fill #0

## 2021-02-18 MED ORDER — ARIPIPRAZOLE 2 MG PO TABS
ORAL_TABLET | ORAL | 2 refills | Status: DC
Start: 2021-02-18 — End: 2021-03-07
  Filled 2021-02-18: qty 30, 30d supply, fill #0

## 2021-02-25 ENCOUNTER — Other Ambulatory Visit (HOSPITAL_COMMUNITY): Payer: Self-pay

## 2021-03-07 ENCOUNTER — Other Ambulatory Visit: Payer: Self-pay

## 2021-03-07 ENCOUNTER — Ambulatory Visit: Payer: 59 | Admitting: Obstetrics & Gynecology

## 2021-03-07 ENCOUNTER — Encounter: Payer: Self-pay | Admitting: Obstetrics & Gynecology

## 2021-03-07 ENCOUNTER — Other Ambulatory Visit (HOSPITAL_COMMUNITY): Payer: Self-pay

## 2021-03-07 VITALS — BP 121/83 | HR 99 | Ht 65.0 in | Wt 176.0 lb

## 2021-03-07 DIAGNOSIS — D219 Benign neoplasm of connective and other soft tissue, unspecified: Secondary | ICD-10-CM

## 2021-03-07 DIAGNOSIS — N3946 Mixed incontinence: Secondary | ICD-10-CM | POA: Diagnosis not present

## 2021-03-07 DIAGNOSIS — N814 Uterovaginal prolapse, unspecified: Secondary | ICD-10-CM | POA: Diagnosis not present

## 2021-03-07 MED ORDER — MIRABEGRON ER 50 MG PO TB24
50.0000 mg | ORAL_TABLET | Freq: Every day | ORAL | 11 refills | Status: DC
  Filled 2021-03-07: qty 30, 30d supply, fill #0

## 2021-03-07 NOTE — Progress Notes (Signed)
Chief Complaint  Patient presents with  . Urinary Incontinence      40 y.o. [id number] Patient's last menstrual period was 03/07/2021 (exact date). The current method of family planning is tubal ligation.  Outpatient Encounter Medications as of 03/07/2021  Medication Sig  . [START ON 04/20/2021] amphetamine-dextroamphetamine (ADDERALL) 20 MG tablet Take 1 tablet(s) by mouth every morning and 1/2 tablet daily after lunch for ADHD  . ARIPiprazole (ABILIFY) 2 MG tablet TAKE 1 TABLET BY MOUTH AT BEDTIME FOR OCD  . buPROPion (WELLBUTRIN XL) 150 MG 24 hr tablet Take 1 tablet(s) by mouth daily for depression  . FLUoxetine (PROZAC) 40 MG capsule Take 1 capsule(s) by mouth daily for depression/anxiety  . fluticasone (FLONASE) 50 MCG/ACT nasal spray Place 2 sprays into both nostrils daily.  Marland Kitchen HYDROcodone-acetaminophen (NORCO/VICODIN) 5-325 MG tablet TAKE 1 TABLET BY MOUTH EVERY 6 TO 8 HOURS AS NEEDED FOR CHRONIC LOWER BACK PAIN  . mirabegron ER (MYRBETRIQ) 50 MG TB24 tablet Take 1 tablet (50 mg total) by mouth daily.  Marland Kitchen olopatadine (PATADAY) 0.1 % ophthalmic solution Place 1 drop into both eyes 2 (two) times daily.  . pregabalin (LYRICA) 75 MG capsule TAKE 1 CAPSULE BY MOUTH 2 TIMES DAILY  . fenofibrate (TRICOR) 48 MG tablet TAKE 1 TABLET BY MOUTH DAILY (Patient not taking: Reported on 03/07/2021)  . rosuvastatin (CRESTOR) 10 MG tablet TAKE 1 TABLET BY MOUTH DAILY (Patient not taking: Reported on 03/07/2021)  . [DISCONTINUED] amoxicillin-clavulanate (AUGMENTIN) 875-125 MG tablet Take 1 tablet by mouth 2 (two) times daily. One po bid x 7 days  . [DISCONTINUED] amphetamine-dextroamphetamine (ADDERALL) 15 MG tablet Take 1 tablet by mouth daily.  . [DISCONTINUED] amphetamine-dextroamphetamine (ADDERALL) 20 MG tablet TAKE 1 TABLET BY MOUTH EVERY MORNING AND 1/2 TABLET DAILY AFTER LUNCH FOR ADHD  . [DISCONTINUED] amphetamine-dextroamphetamine (ADDERALL) 20 MG tablet TAKE 1 TABLET BY MOUTH EVERY MORNING  AND 1/2 TABLET DAILY AFTER LUNCH FOR ADHD. EFFECTIVE 12/19/20  . [DISCONTINUED] amphetamine-dextroamphetamine (ADDERALL) 20 MG tablet Take 1 tablet(s) by mouth every morning and 1/2 tablet daily after lunch for ADHD *03/21/21  . [DISCONTINUED] amphetamine-dextroamphetamine (ADDERALL) 20 MG tablet Take 1 tablet(s) by mouth every morning and 1/2 tablet daily after lunch for ADHD  . [DISCONTINUED] ARIPiprazole (ABILIFY) 2 MG tablet TAKE 1 TABLET BY MOUTH AT BEDTIME FOR OCD  . [DISCONTINUED] ARIPiprazole (ABILIFY) 2 MG tablet Take 1 tablet(s) by mouth at bedtime for OCD  . [DISCONTINUED] buPROPion (WELLBUTRIN XL) 150 MG 24 hr tablet Take 150 mg by mouth daily.  . [DISCONTINUED] busPIRone (BUSPAR) 10 MG tablet TAKE 1 TABLET BY MOUTH TWICE DAILY FOR ANXIETY  . [DISCONTINUED] busPIRone (BUSPAR) 10 MG tablet Take 1 tablet by mouth twice daily for anxiety  . [DISCONTINUED] diazepam (VALIUM) 5 MG tablet TAKE 1 TABLET BY MOUTH 30 MINS PRIOR TO INJECTION  . [DISCONTINUED] diazepam (VALIUM) 5 MG tablet Take 1 tablet by mouth 30 min prior to injection.  . [DISCONTINUED] HYDROcodone-acetaminophen (NORCO) 5-325 MG tablet Take 1 tablet by mouth 3 (three) times daily as needed for severe pain.  . [DISCONTINUED] HYDROcodone-acetaminophen (NORCO/VICODIN) 5-325 MG tablet TAKE 1 TABLET BY MOUTH EVERY 6 TO 8 HOURS AS NEEDED FOR CHRONIC LOWER BACK PAIN  . [DISCONTINUED] HYDROcodone-acetaminophen (NORCO/VICODIN) 5-325 MG tablet TAKE 1 TABLET BY MOUTH EVERY 6 TO 8 HOURS AS NEEDED FOR LOWER BACK PAIN  . [DISCONTINUED] HYDROcodone-acetaminophen (NORCO/VICODIN) 5-325 MG tablet Take 1 tablet by mouth every 6-8 hours as needed for chronic lower back pain  **  dnf until 02/08/21  . [DISCONTINUED] meloxicam (MOBIC) 7.5 MG tablet Take 1 tablet (7.5 mg total) by mouth daily.  . [DISCONTINUED] pregabalin (LYRICA) 75 MG capsule Take 75 mg by mouth daily.  . [DISCONTINUED] pregabalin (LYRICA) 75 MG capsule TAKE 1 CAPSULE BY MOUTH 2 TIMES  DAILY  . [DISCONTINUED] Ubrogepant (UBRELVY) 100 MG TABS Take 1/2 to 1 tablet at onset of migraine  . [DISCONTINUED] venlafaxine (EFFEXOR) 37.5 MG tablet Take 37.5 mg by mouth 2 (two) times daily.   No facility-administered encounter medications on file as of 03/07/2021.    Subjective [name] is seen today as a new patient Her complaint is urinary urge frequency and occasional loss over the past year She states she gets up about 2 times at night to use the restroom but she constantly has the urge to go Even after she just went to the bathroom When she moves past first desire there is a very small window of opportunity to go to the bathroom before she loses urine and that is the time when she has larger volume urine loss She also does lose urine small amount a dropper to with laughing coughing sneezing jumping on a trampoline Her total instances of incontinence are about 3/week she states She does not have a history of Neri tract infections She has had 3 kids vaginal deliveries her last one was 10 years ago  Past Medical History:  Diagnosis Date  . 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 Bipolar 1 disorder, mixed ([place])   . Bipolar disorder, mixed ([place])   . Depression   . Hair pulling   . HSIL on Pap smear of cervix 2013   [place]    Past Surgical History:  Procedure Laterality Date  . CHOLECYSTECTOMY    . LAPAROSCOPIC APPENDECTOMY N/A 05/03/2018   Procedure: APPENDECTOMY LAPAROSCOPIC;  Surgeon: Greer Pickerel, MD;  Location: [facility];  Service: General;  Laterality: N/A;  . TUBAL LIGATION      OB History    Gravida  3   Para  3   Term  3   Preterm      AB      Living  3     SAB      IAB      Ectopic      Multiple      Live Births  3           Allergies  Allergen Reactions  . Lamictal [Lamotrigine]     Knots in throat and back of head    Social History   Socioeconomic History  . Marital status: Married    Spouse  name: Not on file  . Number of children: 3  . Years of education: Not on file  . Highest education level: Not on file  Occupational History  . Not on file  Tobacco Use  . Smoking status: Former Smoker    Packs/day: 0.50    Types: Cigarettes    Quit date: 06/08/2019    Years since quitting: 1.7  . Smokeless tobacco: Never Used  Vaping Use  . Vaping Use: Never used  Substance and Sexual Activity  . Alcohol use: Yes    Alcohol/week: 0.0 standard drinks    Comment: socially only less than monthly  . Drug use: No  . Sexual activity: Yes    Birth control/protection: Surgical  Other Topics Concern  . Not on file  Social History Narrative  .  Not on file   Social Determinants of Health   Financial Resource Strain: Low Risk   . Difficulty of Paying Living Expenses: Not hard at all  Food Insecurity: No Food Insecurity  . Worried About Charity fundraiser in the Last Year: Never true  . Ran Out of Food in the Last Year: Never true  Transportation Needs: No Transportation Needs  . Lack of Transportation (Medical): No  . Lack of Transportation (Non-Medical): No  Physical Activity: Sufficiently Active  . Days of Exercise per Week: 5 days  . Minutes of Exercise per Session: 30 min  Stress: No Stress Concern Present  . Feeling of Stress : Only a little  Social Connections: Moderately Integrated  . Frequency of Communication with Friends and Family: More than three times a week  . Frequency of Social Gatherings with Friends and Family: Once a week  . Attends Religious Services: 1 to 4 times per year  . Active Member of Clubs or Organizations: No  . Attends Archivist Meetings: Never  . Marital Status: Married    Family History  Problem Relation Age of Onset  . Cancer Mother        lung  . Heart disease Mother   . Alcohol abuse Mother   . Bipolar disorder Mother   . Cancer Father        prostate     Medications:       Current Outpatient Medications:  .  [START ON  04/20/2021] amphetamine-dextroamphetamine (ADDERALL) 20 MG tablet, Take 1 tablet(s) by mouth every morning and 1/2 tablet daily after lunch for ADHD, Disp: 45 tablet, Rfl: 0 .  ARIPiprazole (ABILIFY) 2 MG tablet, TAKE 1 TABLET BY MOUTH AT BEDTIME FOR OCD, Disp: 30 tablet, Rfl: 2 .  buPROPion (WELLBUTRIN XL) 150 MG 24 hr tablet, Take 1 tablet(s) by mouth daily for depression, Disp: 30 tablet, Rfl: 2 .  FLUoxetine (PROZAC) 40 MG capsule, Take 1 capsule(s) by mouth daily for depression/anxiety, Disp: 30 capsule, Rfl: 2 .  fluticasone (FLONASE) 50 MCG/ACT nasal spray, Place 2 sprays into both nostrils daily., Disp: 16 g, Rfl: 0 .  HYDROcodone-acetaminophen (NORCO/VICODIN) 5-325 MG tablet, TAKE 1 TABLET BY MOUTH EVERY 6 TO 8 HOURS AS NEEDED FOR CHRONIC LOWER BACK PAIN, Disp: 105 tablet, Rfl: 0 .  mirabegron ER (MYRBETRIQ) 50 MG TB24 tablet, Take 1 tablet (50 mg total) by mouth daily., Disp: 30 tablet, Rfl: 11 .  olopatadine (PATADAY) 0.1 % ophthalmic solution, Place 1 drop into both eyes 2 (two) times daily., Disp: 5 mL, Rfl: 12 .  pregabalin (LYRICA) 75 MG capsule, TAKE 1 CAPSULE BY MOUTH 2 TIMES DAILY, Disp: 60 capsule, Rfl: 2 .  fenofibrate (TRICOR) 48 MG tablet, TAKE 1 TABLET BY MOUTH DAILY (Patient not taking: Reported on 03/07/2021), Disp: 90 tablet, Rfl: 1 .  rosuvastatin (CRESTOR) 10 MG tablet, TAKE 1 TABLET BY MOUTH DAILY (Patient not taking: Reported on 03/07/2021), Disp: 90 tablet, Rfl: 3  Objective Blood pressure 121/83, pulse 99, height 5\' 5"  (1.651 m), weight 176 lb (79.8 kg), last menstrual period 03/07/2021.  General WDWN female NAD Vulva:  normal appearing vulva with no masses, tenderness or lesions Vagina:  normal mucosa, no discharge, no bladder neck hypermobility or cystocele is noted on provocative exam Cervix:  Normal no lesions Uterus: Anteverted a bit generous in size consistent with a fundal fibroid, contour, position, consistency, mobility, non-tender and grade 2 descent Adnexa:  ovaries:present,  normal adnexa in size, nontender and  no masses   Pertinent ROS No burning with urination, frequency or urgency No nausea, vomiting or diarrhea Nor fever chills or other constitutional symptoms   Labs or studies Reviewed her op note from last June as well as her CT scan    Impression Diagnoses this Encounter::   ICD-10-CM   1. Mixed incontinence: urge >>SUI  N39.46    will trial Myrbetriq 50, also it seems as uterine relaxation is putting some pressure on the bladder, the bladder and bladder neck itself is not hypermobile  2. Fibroid  D21.9 US PELVIS (TRANSABDOMINAL ONLY)    US PELVIS TRANSVAGINAL NON-OB (TV ONLY)   sonogram at follow up visit  3. Uterine prolapse, Grade 2  N81.4     Established relevant diagnosis(es):   Plan/Recommendations: Meds ordered this encounter  Medications  . mirabegron ER (MYRBETRIQ) 50 MG TB24 tablet    Sig: Take 1 tablet (50 mg total) by mouth daily.    Dispense:  30 tablet    Refill:  11    Labs or Scans Ordered: Orders Placed This Encounter  Procedures  . US PELVIS (TRANSABDOMINAL ONLY)  . US PELVIS TRANSVAGINAL NON-OB (TV ONLY)    Management:: As noted above  Follow up Return in about 3 months (around 06/07/2021) for GYN sono, Follow up, with Dr Elonda Husky.        All questions were answered.

## 2021-03-08 ENCOUNTER — Other Ambulatory Visit (HOSPITAL_COMMUNITY): Payer: Self-pay

## 2021-03-08 MED FILL — Hydrocodone-Acetaminophen Tab 5-325 MG: ORAL | 26 days supply | Qty: 105 | Fill #0 | Status: AC

## 2021-03-11 ENCOUNTER — Telehealth: Payer: Self-pay | Admitting: Obstetrics & Gynecology

## 2021-03-11 NOTE — Telephone Encounter (Signed)
Pt would like a different medicine called in.  She got the mirabegron ER (MYRBETRIQ) 50 MG TB24 tablet but her copay for it is $80.00 & pt would like something cheaper  Please advise & notify pt   [place]

## 2021-03-14 ENCOUNTER — Other Ambulatory Visit (HOSPITAL_COMMUNITY): Payer: Self-pay

## 2021-03-14 ENCOUNTER — Encounter: Payer: Self-pay | Admitting: Family Medicine

## 2021-03-14 ENCOUNTER — Other Ambulatory Visit: Payer: Self-pay

## 2021-03-14 ENCOUNTER — Ambulatory Visit: Payer: 59 | Admitting: Family Medicine

## 2021-03-14 VITALS — BP 121/83 | HR 92 | Temp 98.3°F | Ht 65.0 in | Wt 174.0 lb

## 2021-03-14 DIAGNOSIS — M5416 Radiculopathy, lumbar region: Secondary | ICD-10-CM | POA: Diagnosis not present

## 2021-03-14 DIAGNOSIS — E782 Mixed hyperlipidemia: Secondary | ICD-10-CM

## 2021-03-14 DIAGNOSIS — G43109 Migraine with aura, not intractable, without status migrainosus: Secondary | ICD-10-CM

## 2021-03-14 DIAGNOSIS — N3946 Mixed incontinence: Secondary | ICD-10-CM | POA: Diagnosis not present

## 2021-03-14 DIAGNOSIS — F319 Bipolar disorder, unspecified: Secondary | ICD-10-CM

## 2021-03-14 DIAGNOSIS — F988 Other specified behavioral and emotional disorders with onset usually occurring in childhood and adolescence: Secondary | ICD-10-CM | POA: Diagnosis not present

## 2021-03-14 MED ORDER — ROSUVASTATIN CALCIUM 10 MG PO TABS
10.0000 mg | ORAL_TABLET | Freq: Every day | ORAL | 1 refills | Status: DC
  Filled 2021-03-14: qty 90, 90d supply, fill #0

## 2021-03-14 MED ORDER — TOPIRAMATE 25 MG PO TABS
25.0000 mg | ORAL_TABLET | Freq: Every day | ORAL | 2 refills | Status: DC
  Filled 2021-03-14: qty 30, 15d supply, fill #0

## 2021-03-14 MED ORDER — RIZATRIPTAN BENZOATE 5 MG PO TABS
5.0000 mg | ORAL_TABLET | ORAL | 2 refills | Status: DC | PRN
  Filled 2021-03-14: qty 10, 30d supply, fill #0

## 2021-03-14 MED ORDER — FENOFIBRATE 48 MG PO TABS
48.0000 mg | ORAL_TABLET | Freq: Every day | ORAL | 1 refills | Status: DC
  Filled 2021-03-14: qty 90, 90d supply, fill #0

## 2021-03-14 NOTE — Progress Notes (Signed)
New Patient Office Visit  Assessment & Plan:  1. Migraine with aura and without status migrainosus, not intractable Uncontrolled.  Previously failed Ubrelvy and Imitrex.  Started patient on Topamax 25 mg at bedtime with directions that [name] may increase to 50 mg after 1 week if needed.  Also started Maxalt for abortive therapy. - topiramate (TOPAMAX) 25 MG tablet; Take 1 tablet (25 mg total) by mouth at bedtime. May increase to 50 mg after 1 week if needed.  Dispense: 30 tablet; Refill: 2 - rizatriptan (MAXALT) 5 MG tablet; Take 1 tablet (5 mg total) by mouth as needed for migraine. May repeat in 2 hours if needed  Dispense: 10 tablet; Refill: 2  2. Mixed hyperlipidemia Patient has been out of medications.  They were refilled today. - rosuvastatin (CRESTOR) 10 MG tablet; Take 1 tablet (10 mg total) by mouth daily.  Dispense: 90 tablet; Refill: 1 - fenofibrate (TRICOR) 48 MG tablet; Take 1 tablet (48 mg total) by mouth daily.  Dispense: 90 tablet; Refill: 1  3. Mixed incontinence Discussed with patient there are other options besides Myrbetriq since it causes her to have a headache every day.  [name] would like to discuss with her OB/GYN, since he is the one that started the medication.  4. Lumbar back pain with radiculopathy affecting right lower extremity Managed by Leonie Green.  5-6. Bipolar I disorder (HCC)/Attention deficit disorder, unspecified hyperactivity presence Managed by psychiatry.   Follow-up: Return in about 4 weeks (around 04/11/2021) for migraines.   Hendricks Limes, MSN, APRN, FNP-C [facility]  Subjective:  Patient ID: [name], female    DOB: 06-24-1981  Age: 40 y.o. MRN: [id number]  Patient Care Team: Loman Brooklyn, FNP as PCP - General (Family Medicine) Florian Buff, MD as Consulting Physician (Obstetrics and Gynecology) Leonie Green, NP as Nurse Practitioner (Neurosurgery) Stephannie Peters, FNP (Psychiatry)  CC:  Chief  Complaint  Patient presents with  . New Patient (Initial Visit)    [place]   . Establish Care  . Headache    Patient states ongoing and gets a few times a week    HPI [name] presents to establish care.  Migraines: Patient reports [name] is currently having migraines every day since starting Myrbetriq.  Prior to starting Myrbetriq, [name] was having them a couple of times per week.  [name] previously failed therapy with Roselyn Meier.  [name] took Topamax years ago and does not remember if it worked or not.  [name] previously took Imitrex which [name] did not do well with as it just made her sleep.  Hyperlipidemia: Patient has been out of her fenofibrate and rosuvastatin x2 months.   Review of Systems  Constitutional: Negative for chills, fever, malaise/fatigue and weight loss.  HENT: Negative for congestion, ear discharge, ear pain, nosebleeds, sinus pain, sore throat and tinnitus.   Eyes: Negative for blurred vision, double vision, pain, discharge and redness.  Respiratory: Negative for cough, shortness of breath and wheezing.   Cardiovascular: Negative for chest pain, palpitations and leg swelling.  Gastrointestinal: Negative for abdominal pain, constipation, diarrhea, heartburn, nausea and vomiting.  Genitourinary: Negative for dysuria, frequency and urgency.  Musculoskeletal: Positive for back pain. Negative for myalgias.  Skin: Negative for rash.  Neurological: Positive for headaches. Negative for dizziness, seizures and weakness.  Psychiatric/Behavioral: Negative for depression, substance abuse and suicidal ideas. The patient is not nervous/anxious.     Current Outpatient Medications:  .  [START ON 04/20/2021] amphetamine-dextroamphetamine (ADDERALL)  20 MG tablet, Take 1 tablet(s) by mouth every morning and 1/2 tablet daily after lunch for ADHD, Disp: 45 tablet, Rfl: 0 .  ARIPiprazole (ABILIFY) 2 MG tablet, TAKE 1 TABLET BY MOUTH AT BEDTIME FOR OCD, Disp: 30 tablet, Rfl: 2 .  buPROPion  (WELLBUTRIN XL) 150 MG 24 hr tablet, Take 1 tablet(s) by mouth daily for depression, Disp: 30 tablet, Rfl: 2 .  fenofibrate (TRICOR) 48 MG tablet, TAKE 1 TABLET BY MOUTH DAILY, Disp: 90 tablet, Rfl: 1 .  FLUoxetine (PROZAC) 40 MG capsule, Take 1 capsule(s) by mouth daily for depression/anxiety, Disp: 30 capsule, Rfl: 2 .  fluticasone (FLONASE) 50 MCG/ACT nasal spray, Place 2 sprays into both nostrils daily., Disp: 16 g, Rfl: 0 .  HYDROcodone-acetaminophen (NORCO/VICODIN) 5-325 MG tablet, TAKE 1 TABLET BY MOUTH EVERY 6 TO 8 HOURS AS NEEDED FOR CHRONIC LOWER BACK PAIN, Disp: 105 tablet, Rfl: 0 .  mirabegron ER (MYRBETRIQ) 50 MG TB24 tablet, Take 1 tablet (50 mg total) by mouth daily., Disp: 30 tablet, Rfl: 11 .  olopatadine (PATADAY) 0.1 % ophthalmic solution, Place 1 drop into both eyes 2 (two) times daily., Disp: 5 mL, Rfl: 12 .  pregabalin (LYRICA) 75 MG capsule, TAKE 1 CAPSULE BY MOUTH 2 TIMES DAILY, Disp: 60 capsule, Rfl: 2 .  rosuvastatin (CRESTOR) 10 MG tablet, TAKE 1 TABLET BY MOUTH DAILY, Disp: 90 tablet, Rfl: 3  Allergies  Allergen Reactions  . Lamictal [Lamotrigine]     Knots in throat and back of head    Past Medical History:  Diagnosis Date  . 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 Bipolar 1 disorder, mixed ([place])   . Depression   . Hair pulling   . HSIL on Pap smear of cervix 2013   [place]  . Migraines     Past Surgical History:  Procedure Laterality Date  . APPENDECTOMY  2019  . CHOLECYSTECTOMY    . LAPAROSCOPIC APPENDECTOMY N/A 05/03/2018   Procedure: APPENDECTOMY LAPAROSCOPIC;  Surgeon: Greer Pickerel, MD;  Location: [facility];  Service: General;  Laterality: N/A;  . TUBAL LIGATION      Family History  Problem Relation Age of Onset  . Heart disease Mother   . COPD Mother   . Lung cancer Mother   . Alcohol abuse Father   . Bipolar disorder Father   . Prostate cancer Father   . Melanoma Paternal Grandmother     Social History    Socioeconomic History  . Marital status: Married    Spouse name: Not on file  . Number of children: 3  . Years of education: Not on file  . Highest education level: Not on file  Occupational History  . Not on file  Tobacco Use  . Smoking status: Former Smoker    Packs/day: 0.50    Types: Cigarettes    Quit date: 06/08/2019    Years since quitting: 1.7  . Smokeless tobacco: Never Used  Vaping Use  . Vaping Use: Some days  Substance and Sexual Activity  . Alcohol use: Yes    Alcohol/week: 0.0 standard drinks    Comment: occ  . Drug use: No  . Sexual activity: Yes    Birth control/protection: Surgical  Other Topics Concern  . Not on file  Social History Narrative  . Not on file   Social Determinants of Health   Financial Resource Strain: Low Risk   . Difficulty of Paying Living Expenses:  Not hard at all  Food Insecurity: No Food Insecurity  . Worried About Charity fundraiser in the Last Year: Never true  . Ran Out of Food in the Last Year: Never true  Transportation Needs: No Transportation Needs  . Lack of Transportation (Medical): No  . Lack of Transportation (Non-Medical): No  Physical Activity: Sufficiently Active  . Days of Exercise per Week: 5 days  . Minutes of Exercise per Session: 30 min  Stress: No Stress Concern Present  . Feeling of Stress : Only a little  Social Connections: Moderately Integrated  . Frequency of Communication with Friends and Family: More than three times a week  . Frequency of Social Gatherings with Friends and Family: Once a week  . Attends Religious Services: 1 to 4 times per year  . Active Member of Clubs or Organizations: No  . Attends Archivist Meetings: Never  . Marital Status: Married  Human resources officer Violence: Not At Risk  . Fear of Current or Ex-Partner: No  . Emotionally Abused: No  . Physically Abused: No  . Sexually Abused: No    Objective:   Today's Vitals: BP 121/83   Pulse 92   Temp 98.3 F (36.8  C) (Temporal)   Ht 5\' 5"  (1.651 m)   Wt 174 lb (78.9 kg)   LMP 03/07/2021 (Exact Date)   SpO2 98%   BMI 28.96 kg/m   Physical Exam Vitals reviewed.  Constitutional:      General: [name] is not in acute distress.    Appearance: Normal appearance. [name] is overweight. [name] is not ill-appearing, toxic-appearing or diaphoretic.  HENT:     Head: Normocephalic and atraumatic.  Eyes:     General: No scleral icterus.       Right eye: No discharge.        Left eye: No discharge.     Conjunctiva/sclera: Conjunctivae normal.  Cardiovascular:     Rate and Rhythm: Normal rate and regular rhythm.     Heart sounds: Normal heart sounds. No murmur heard. No friction rub. No gallop.   Pulmonary:     Effort: Pulmonary effort is normal. No respiratory distress.     Breath sounds: Normal breath sounds. No stridor. No wheezing, rhonchi or rales.  Musculoskeletal:        General: Normal range of motion.     Cervical back: Normal range of motion.  Skin:    General: Skin is warm and dry.     Capillary Refill: Capillary refill takes less than 2 seconds.  Neurological:     General: No focal deficit present.     Mental Status: [name] is alert and oriented to person, place, and time. Mental status is at baseline.  Psychiatric:        Mood and Affect: Mood normal.        Behavior: Behavior normal.        Thought Content: Thought content normal.        Judgment: Judgment normal.

## 2021-03-17 DIAGNOSIS — N3946 Mixed incontinence: Secondary | ICD-10-CM | POA: Insufficient documentation

## 2021-03-25 ENCOUNTER — Other Ambulatory Visit (HOSPITAL_COMMUNITY): Payer: Self-pay

## 2021-03-28 DIAGNOSIS — Z20822 Contact with and (suspected) exposure to covid-19: Secondary | ICD-10-CM | POA: Diagnosis not present

## 2021-04-03 ENCOUNTER — Telehealth: Payer: 59 | Admitting: Family

## 2021-04-03 DIAGNOSIS — R059 Cough, unspecified: Secondary | ICD-10-CM | POA: Diagnosis not present

## 2021-04-03 MED ORDER — BENZONATATE 100 MG PO CAPS: 100.0000 mg | ORAL_CAPSULE | Freq: Three times a day (TID) | ORAL | 0 refills | Status: DC | PRN

## 2021-04-03 MED ORDER — PREDNISONE 10 MG (21) PO TBPK: ORAL_TABLET | ORAL | 0 refills | Status: DC

## 2021-04-03 MED ORDER — FLUTICASONE PROPIONATE 50 MCG/ACT NA SUSP: 2.0000 | Freq: Every day | NASAL | 6 refills | Status: DC

## 2021-04-03 NOTE — Progress Notes (Signed)
We are sorry that you are not feeling well.  Here is how we plan to help!  Based on your presentation I believe you most likely have A cough due to a virus.  This is called viral bronchitis and is best treated by rest, plenty of fluids and control of the cough.  You may use Ibuprofen or Tylenol as directed to help your symptoms.     In addition you may use A non-prescription cough medication called Robitussin DAC. Take 2 teaspoons every 8 hours or Delsym: take 2 teaspoons every 12 hours., A non-prescription cough medication called Mucinex DM: take 2 tablets every 12 hours., and A prescription cough medication called Tessalon Perles 100mg . You may take 1-2 capsules every 8 hours as needed for your cough.  Prednisone 10 mg daily for 6 days (see taper instructions below)  Directions for 6 day taper: Day 1: 2 tablets before breakfast, 1 after both lunch & dinner and 2 at bedtime Day 2: 1 tab before breakfast, 1 after both lunch & dinner and 2 at bedtime Day 3: 1 tab at each meal & 1 at bedtime Day 4: 1 tab at breakfast, 1 at lunch, 1 at bedtime Day 5: 1 tab at breakfast & 1 tab at bedtime Day 6: 1 tab at breakfast  From your responses in the eVisit questionnaire you describe inflammation in the upper respiratory tract which is causing a significant cough.  This is commonly called Bronchitis and has four common causes:   Allergies Viral Infections Acid Reflux Bacterial Infection Allergies, viruses and acid reflux are treated by controlling symptoms or eliminating the cause. An example might be a cough caused by taking certain blood pressure medications. You stop the cough by changing the medication. Another example might be a cough caused by acid reflux. Controlling the reflux helps control the cough.  USE OF BRONCHODILATOR ("RESCUE") INHALERS: There is a risk from using your bronchodilator too frequently.  The risk is that over-reliance on a medication which only relaxes the muscles surrounding  the breathing tubes can reduce the effectiveness of medications prescribed to reduce swelling and congestion of the tubes themselves.  Although you feel brief relief from the bronchodilator inhaler, your asthma may actually be worsening with the tubes becoming more swollen and filled with mucus.  This can delay other crucial treatments, such as oral steroid medications. If you need to use a bronchodilator inhaler daily, several times per day, you should discuss this with your provider.  There are probably better treatments that could be used to keep your asthma under control.     HOME CARE Only take medications as instructed by your medical team. Complete the entire course of an antibiotic. Drink plenty of fluids and get plenty of rest. Avoid close contacts especially the very young and the elderly Cover your mouth if you cough or cough into your sleeve. Always remember to wash your hands A steam or ultrasonic humidifier can help congestion.   GET HELP RIGHT AWAY IF: You develop worsening fever. You become short of breath You cough up blood. Your symptoms persist after you have completed your treatment plan MAKE SURE YOU  Understand these instructions. Will watch your condition. Will get help right away if you are not doing well or get worse.  Your e-visit answers were reviewed by a board certified advanced clinical practitioner to complete your personal care plan.  Depending on the condition, your plan could have included both over the counter or prescription medications. If there  is a problem please reply  once you have received a response from your provider. Your safety is important to [place].  If you have drug allergies check your prescription carefully.    You can use MyChart to ask questions about today's visit, request a non-urgent call back, or ask for a work or school excuse for 24 hours related to this e-Visit. If it has been greater than 24 hours you will need to follow up with your  provider, or enter a new e-Visit to address those concerns. You will get an e-mail in the next two days asking about your experience.  I hope that your e-visit has been valuable and will speed your recovery. Thank you for using e-visits.  Approximately 5 minutes was spent documenting and reviewing patient's chart.

## 2021-04-05 ENCOUNTER — Other Ambulatory Visit: Payer: Self-pay

## 2021-04-05 ENCOUNTER — Other Ambulatory Visit (HOSPITAL_COMMUNITY): Payer: Self-pay

## 2021-04-05 ENCOUNTER — Encounter: Payer: Self-pay | Admitting: Family Medicine

## 2021-04-05 ENCOUNTER — Ambulatory Visit: Payer: 59 | Admitting: Family Medicine

## 2021-04-05 VITALS — BP 105/74 | HR 102 | Temp 97.4°F | Ht 65.0 in | Wt 173.6 lb

## 2021-04-05 DIAGNOSIS — Z0001 Encounter for general adult medical examination with abnormal findings: Secondary | ICD-10-CM

## 2021-04-05 DIAGNOSIS — Z0000 Encounter for general adult medical examination without abnormal findings: Secondary | ICD-10-CM

## 2021-04-05 DIAGNOSIS — G43109 Migraine with aura, not intractable, without status migrainosus: Secondary | ICD-10-CM

## 2021-04-05 MED ORDER — TOPIRAMATE 50 MG PO TABS
50.0000 mg | ORAL_TABLET | Freq: Every day | ORAL | 3 refills | Status: DC
  Filled 2021-04-05: qty 90, 90d supply, fill #0

## 2021-04-05 MED ORDER — HYDROCODONE-ACETAMINOPHEN 5-325 MG PO TABS
ORAL_TABLET | ORAL | 0 refills | Status: DC
  Filled 2021-04-05: qty 105, 26d supply, fill #0

## 2021-04-05 NOTE — Patient Instructions (Signed)
Preventive Care 21-39 Years Old, Female Preventive care refers to lifestyle choices and visits with your health care provider that can promote health and wellness. This includes: A yearly physical exam. This is also called an annual wellness visit. Regular dental and eye exams. Immunizations. Screening for certain conditions. Healthy lifestyle choices, such as: Eating a healthy diet. Getting regular exercise. Not using drugs or products that contain nicotine and tobacco. Limiting alcohol use. What can I expect for my preventive care visit? Physical exam Your health care provider may check your: Height and weight. These may be used to calculate your BMI (body mass index). BMI is a measurement that tells if you are at a healthy weight. Heart rate and blood pressure. Body temperature. Skin for abnormal spots. Counseling Your health care provider may ask you questions about your: Past medical problems. Family's medical history. Alcohol, tobacco, and drug use. Emotional well-being. Home life and relationship well-being. Sexual activity. Diet, exercise, and sleep habits. Work and work environment. Access to firearms. Method of birth control. Menstrual cycle. Pregnancy history. What immunizations do I need?  Vaccines are usually given at various ages, according to a schedule. Your health care provider will recommend vaccines for you based on your age, medicalhistory, and lifestyle or other factors, such as travel or where you work. What tests do I need?  Blood tests Lipid and cholesterol levels. These may be checked every 5 years starting at age 20. Hepatitis C test. Hepatitis B test. Screening Diabetes screening. This is done by checking your blood sugar (glucose) after you have not eaten for a while (fasting). STD (sexually transmitted disease) testing, if you are at risk. BRCA-related cancer screening. This may be done if you have a family history of breast, ovarian, tubal, or  peritoneal cancers. Pelvic exam and Pap test. This may be done every 3 years starting at age 21. Starting at age 30, this may be done every 5 years if you have a Pap test in combination with an HPV test. Talk with your health care provider about your test results, treatment options,and if necessary, the need for more tests. Follow these instructions at home: Eating and drinking  Eat a healthy diet that includes fresh fruits and vegetables, whole grains, lean protein, and low-fat dairy products. Take vitamin and mineral supplements as recommended by your health care provider. Do not drink alcohol if: Your health care provider tells you not to drink. You are pregnant, may be pregnant, or are planning to become pregnant. If you drink alcohol: Limit how much you have to 0-1 drink a day. Be aware of how much alcohol is in your drink. In the [place] one drink equals one 12 oz bottle of beer (355 mL), one 5 oz glass of wine (148 mL), or one 1 oz glass of hard liquor (44 mL).  Lifestyle Take daily care of your teeth and gums. Brush your teeth every morning and night with fluoride toothpaste. Floss one time each day. Stay active. Exercise for at least 30 minutes 5 or more days each week. Do not use any products that contain nicotine or tobacco, such as cigarettes, e-cigarettes, and chewing tobacco. If you need help quitting, ask your health care provider. Do not use drugs. If you are sexually active, practice safe sex. Use a condom or other form of protection to prevent STIs (sexually transmitted infections). If you do not wish to become pregnant, use a form of birth control. If you plan to become pregnant, see your health care   provider for a prepregnancy visit. Find healthy ways to cope with stress, such as: Meditation, yoga, or listening to music. Journaling. Talking to a trusted person. Spending time with friends and family. Safety Always wear your seat belt while driving or riding in a  vehicle. Do not drive: If you have been drinking alcohol. Do not ride with someone who has been drinking. When you are tired or distracted. While texting. Wear a helmet and other protective equipment during sports activities. If you have firearms in your house, make sure you follow all gun safety procedures. Seek help if you have been physically or sexually abused. What's next? Go to your health care provider once a year for an annual wellness visit. Ask your health care provider how often you should have your eyes and teeth checked. Stay up to date on all vaccines. This information is not intended to replace advice given to you by your health care provider. Make sure you discuss any questions you have with your healthcare provider. Document Revised: 06/03/2020 Document Reviewed: 06/17/2018 Elsevier Patient Education  2022 [employer].

## 2021-04-05 NOTE — Progress Notes (Signed)
Assessment & Plan:  1. Well adult exam Preventive health education provided. Patient declined COVID booster. Agreeable to hepatitis C screening the next time we do lab work.  2. Migraine with aura and without status migrainosus, not intractable Well controlled on current regimen.  - topiramate (TOPAMAX) 50 MG tablet; Take 1 tablet (50 mg total) by mouth at bedtime.  Dispense: 90 tablet; Refill: 3   Follow-up: Return in about 6 months (around 10/05/2021) for follow-up of chronic medication conditions.   Hendricks Limes, MSN, APRN, FNP-C [facility]  Subjective:  Patient ID: [name], female    DOB: November 19, 1980  Age: 40 y.o. MRN: [id number]  Patient Care Team: Loman Brooklyn, FNP as PCP - General (Family Medicine) Florian Buff, MD as Consulting Physician (Obstetrics and Gynecology) Leonie Green, NP as Nurse Practitioner (Neurosurgery) Stephannie Peters, [place] (Psychiatry)   CC:  Chief Complaint  Patient presents with   Migraine    4 week follow up- patient states that she has not had any migraines    Annual Exam    HPI [name] presents for her annual physical and migraine follow-up.  Occupation: Marine scientist at [employer], Marital status: Married, Substance use: None Diet: None, Exercise: None Last eye exam: January 2022 Last dental exam: 4 months ago Last pap smear: 02/10/2020 - completed by OBGYN Hepatitis C Screening: Agreeable to complete with next lab work. Immunizations: Flu Vaccine: up to date Tdap Vaccine: up to date  COVID-19 Vaccine:  had the first two doses; declined booster  DEPRESSION SCREENING PHQ 2/9 Scores 04/05/2021 03/14/2021 03/07/2021 02/10/2020 08/08/2019 01/10/2019 06/17/2018  PHQ - 2 Score 0 0 0 0 0 0 0  PHQ- 9 Score 0 0 0 - - - -     Migraines: patient is taking Topamax 50 mg that was started a month ago and reports she has not had any migraines. She was also given Maxalt for abortive treatment, but has not needed it.     Review of Systems  Constitutional:  Negative for chills, fever, malaise/fatigue and weight loss.  HENT:  Negative for congestion, ear discharge, ear pain, nosebleeds, sinus pain, sore throat and tinnitus.   Eyes:  Negative for blurred vision, double vision, pain, discharge and redness.  Respiratory:  Negative for cough, shortness of breath and wheezing.   Cardiovascular:  Negative for chest pain, palpitations and leg swelling.  Gastrointestinal:  Negative for abdominal pain, constipation, diarrhea, heartburn, nausea and vomiting.  Genitourinary:  Negative for dysuria, frequency and urgency.  Musculoskeletal:  Negative for myalgias.  Skin:  Negative for rash.  Neurological:  Negative for dizziness, seizures, weakness and headaches.  Psychiatric/Behavioral:  Negative for depression, substance abuse and suicidal ideas. The patient is not nervous/anxious.     Current Outpatient Medications:    [START ON 04/20/2021] amphetamine-dextroamphetamine (ADDERALL) 20 MG tablet, Take 1 tablet(s) by mouth every morning and 1/2 tablet daily after lunch for ADHD, Disp: 45 tablet, Rfl: 0   amphetamine-dextroamphetamine (ADDERALL) 20 MG tablet, Take 1 tablet by mouth every morning and 1/2 tablet daily after lunch for ADHD, Disp: 45 tablet, Rfl: 0   ARIPiprazole (ABILIFY) 2 MG tablet, TAKE 1 TABLET BY MOUTH AT BEDTIME FOR OCD, Disp: 30 tablet, Rfl: 2   benzonatate (TESSALON PERLES) 100 MG capsule, Take 1 capsule (100 mg total) by mouth 3 (three) times daily as needed., Disp: 20 capsule, Rfl: 0   buPROPion (WELLBUTRIN XL) 150 MG 24 hr tablet, Take 1 tablet(s) by mouth  daily for depression, Disp: 30 tablet, Rfl: 2   fenofibrate (TRICOR) 48 MG tablet, Take 1 tablet (48 mg total) by mouth daily., Disp: 90 tablet, Rfl: 1   FLUoxetine (PROZAC) 40 MG capsule, Take 1 capsule(s) by mouth daily for depression/anxiety, Disp: 30 capsule, Rfl: 2   fluticasone (FLONASE) 50 MCG/ACT nasal spray, Place 2 sprays into both  nostrils daily., Disp: 16 g, Rfl: 6   HYDROcodone-acetaminophen (NORCO/VICODIN) 5-325 MG tablet, TAKE 1 TABLET BY MOUTH EVERY 6 TO 8 HOURS AS NEEDED FOR CHRONIC LOWER BACK PAIN, Disp: 105 tablet, Rfl: 0   mirabegron ER (MYRBETRIQ) 50 MG TB24 tablet, Take 1 tablet (50 mg total) by mouth daily., Disp: 30 tablet, Rfl: 11   olopatadine (PATADAY) 0.1 % ophthalmic solution, Place 1 drop into both eyes 2 (two) times daily., Disp: 5 mL, Rfl: 12   pregabalin (LYRICA) 75 MG capsule, TAKE 1 CAPSULE BY MOUTH 2 TIMES DAILY, Disp: 60 capsule, Rfl: 2   rizatriptan (MAXALT) 5 MG tablet, Take 1 tablet (5 mg total) by mouth as needed for migraine. May repeat in 2 hours if needed, Disp: 10 tablet, Rfl: 2   rosuvastatin (CRESTOR) 10 MG tablet, Take 1 tablet (10 mg total) by mouth daily., Disp: 90 tablet, Rfl: 1   topiramate (TOPAMAX) 25 MG tablet, Take 1 tablet (25 mg total) by mouth at bedtime. 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, nerve impingment    Bipolar 1 disorder, mixed ([place])    Depression    Hair pulling    HSIL on Pap smear of cervix 2013   [place]   Migraines     Past Surgical History:  Procedure Laterality Date   APPENDECTOMY  2019   CHOLECYSTECTOMY     LAPAROSCOPIC APPENDECTOMY N/A 05/03/2018   Procedure: APPENDECTOMY LAPAROSCOPIC;  Surgeon: Greer Pickerel, MD;  Location: [facility];  Service: General;  Laterality: N/A;   TUBAL LIGATION      Family History  Problem Relation Age of Onset   Heart disease Mother    COPD Mother    Lung cancer Mother    Alcohol abuse Father    Bipolar disorder Father    Prostate cancer Father    Melanoma Paternal Grandmother     Social History   Socioeconomic History   Marital status: Married    Spouse name: Not on file   Number of  children: 3   Years of education: Not on file   Highest education level: Not on file  Occupational History   Not on file  Tobacco Use   Smoking status: Former    Packs/day: 0.50    Pack years: 0.00    Types: Cigarettes    Quit date: 06/08/2019    Years since quitting: 1.8   Smokeless tobacco: Never  Vaping Use   Vaping Use: Some days  Substance and Sexual Activity   Alcohol use: Yes    Alcohol/week: 0.0 standard drinks    Comment: occ   Drug use: No   Sexual activity: Yes    Birth control/protection: Surgical  Other Topics Concern   Not on file  Social History Narrative   Not on file   Social Determinants of Health  Financial Resource Strain: Low Risk    Difficulty of Paying Living Expenses: Not hard at all  Food Insecurity: No Food Insecurity   Worried About Charity fundraiser in the Last Year: Never true   Ran Out of Food in the Last Year: Never true  Transportation Needs: No Transportation Needs   Lack of Transportation (Medical): No   Lack of Transportation (Non-Medical): No  Physical Activity: Sufficiently Active   Days of Exercise per Week: 5 days   Minutes of Exercise per Session: 30 min  Stress: No Stress Concern Present   Feeling of Stress : Only a little  Social Connections: Moderately Integrated   Frequency of Communication with Friends and Family: More than three times a week   Frequency of Social Gatherings with Friends and Family: Once a week   Attends Religious Services: 1 to 4 times per year   Active Member of [employer] or Organizations: No   Attends Archivist Meetings: Never   Marital Status: Married  Human resources officer Violence: Not At Risk   Fear of Current or Ex-Partner: No   Emotionally Abused: No   Physically Abused: No   Sexually Abused: No      Objective:    BP 105/74   Pulse (!) 102   Temp (!) 97.4 F (36.3 C) (Temporal)   Ht 5\' 5"  (1.651 m)   Wt 173 lb 9.6 oz (78.7 kg)   LMP 03/07/2021 (Exact Date)   SpO2 98%   BMI  28.89 kg/m   Wt Readings from Last 3 Encounters:  04/05/21 173 lb 9.6 oz (78.7 kg)  03/14/21 174 lb (78.9 kg)  03/07/21 176 lb (79.8 kg)    Physical Exam Vitals reviewed.  Constitutional:      General: She is not in acute distress.    Appearance: Normal appearance. She is overweight. She is not ill-appearing, toxic-appearing or diaphoretic.  HENT:     Head: Normocephalic and atraumatic.     Right Ear: Tympanic membrane, ear canal and external ear normal. There is no impacted cerumen.     Left Ear: Tympanic membrane, ear canal and external ear normal. There is no impacted cerumen.     Nose: Nose normal. No congestion or rhinorrhea.     Mouth/Throat:     Mouth: Mucous membranes are moist.     Pharynx: Oropharynx is clear. No oropharyngeal exudate or posterior oropharyngeal erythema.  Eyes:     General: No scleral icterus.       Right eye: No discharge.        Left eye: No discharge.     Conjunctiva/sclera: Conjunctivae normal.     Pupils: Pupils are equal, round, and reactive to light.  Neck:     Vascular: No carotid bruit.  Cardiovascular:     Rate and Rhythm: Normal rate and regular rhythm.     Heart sounds: Normal heart sounds. No murmur heard.   No friction rub. No gallop.  Pulmonary:     Effort: Pulmonary effort is normal. No respiratory distress.     Breath sounds: Normal breath sounds. No stridor. No wheezing, rhonchi or rales.  Abdominal:     General: Abdomen is flat. Bowel sounds are normal. There is no distension.     Palpations: Abdomen is soft. There is no hepatomegaly, splenomegaly or mass.     Tenderness: There is no abdominal tenderness. There is no guarding or rebound.     Hernia: No hernia is present.  Musculoskeletal:  General: Normal range of motion.     Cervical back: Normal range of motion and neck supple. No rigidity. No muscular tenderness.  Lymphadenopathy:     Cervical: No cervical adenopathy.  Skin:    General: Skin is warm and dry.      Capillary Refill: Capillary refill takes less than 2 seconds.  Neurological:     General: No focal deficit present.     Mental Status: She is alert and oriented to person, place, and time. Mental status is at baseline.  Psychiatric:        Mood and Affect: Mood normal.        Behavior: Behavior normal.        Thought Content: Thought content normal.        Judgment: Judgment normal.    Lab Results  Component Value Date   TSH 1.65 02/10/2020   Lab Results  Component Value Date   WBC 4.8 02/10/2020   HGB 13.6 02/10/2020   HCT 41.6 02/10/2020   MCV 81.3 02/10/2020   PLT 248 02/10/2020   Lab Results  Component Value Date   NA 137 02/10/2020   K 4.1 02/10/2020   CO2 26 02/10/2020   GLUCOSE 66 02/10/2020   BUN 8 02/10/2020   CREATININE 0.89 02/10/2020   BILITOT 0.4 02/10/2020   ALKPHOS 101 05/03/2018   AST 32 (H) 02/10/2020   ALT 44 (H) 02/10/2020   PROT 6.8 02/10/2020   ALBUMIN 3.6 05/03/2018   CALCIUM 9.1 02/10/2020   ANIONGAP 11 05/03/2018   Lab Results  Component Value Date   CHOL 210 (H) 02/10/2020   Lab Results  Component Value Date   HDL 42 (L) 02/10/2020   Lab Results  Component Value Date   LDLCALC 126 (H) 02/10/2020   Lab Results  Component Value Date   TRIG 294 (H) 02/10/2020   Lab Results  Component Value Date   CHOLHDL 5.0 (H) 02/10/2020   No results found for: HGBA1C

## 2021-04-08 DIAGNOSIS — M5416 Radiculopathy, lumbar region: Secondary | ICD-10-CM | POA: Diagnosis not present

## 2021-04-16 ENCOUNTER — Other Ambulatory Visit (HOSPITAL_COMMUNITY): Payer: Self-pay

## 2021-04-16 MED ORDER — CARESTART COVID-19 HOME TEST VI KIT
PACK | 0 refills | Status: DC
  Filled 2021-04-16: qty 4, 4d supply, fill #0

## 2021-04-16 MED FILL — Aripiprazole Tab 2 MG: ORAL | 30 days supply | Qty: 30 | Fill #0 | Status: AC

## 2021-04-18 ENCOUNTER — Other Ambulatory Visit (HOSPITAL_COMMUNITY): Payer: Self-pay

## 2021-04-19 ENCOUNTER — Other Ambulatory Visit (HOSPITAL_COMMUNITY): Payer: Self-pay

## 2021-04-23 ENCOUNTER — Other Ambulatory Visit (HOSPITAL_COMMUNITY): Payer: Self-pay

## 2021-04-24 ENCOUNTER — Other Ambulatory Visit (HOSPITAL_COMMUNITY): Payer: Self-pay

## 2021-04-24 DIAGNOSIS — M5137 Other intervertebral disc degeneration, lumbosacral region: Secondary | ICD-10-CM | POA: Diagnosis not present

## 2021-04-24 DIAGNOSIS — M961 Postlaminectomy syndrome, not elsewhere classified: Secondary | ICD-10-CM | POA: Diagnosis not present

## 2021-04-24 MED ORDER — HYDROCODONE-ACETAMINOPHEN 5-325 MG PO TABS
ORAL_TABLET | ORAL | 0 refills | Status: DC
  Filled 2021-05-03: qty 105, 26d supply, fill #0

## 2021-05-03 ENCOUNTER — Other Ambulatory Visit (HOSPITAL_COMMUNITY): Payer: Self-pay

## 2021-05-05 DIAGNOSIS — H5213 Myopia, bilateral: Secondary | ICD-10-CM | POA: Diagnosis not present

## 2021-05-16 DIAGNOSIS — F411 Generalized anxiety disorder: Secondary | ICD-10-CM | POA: Diagnosis not present

## 2021-05-16 DIAGNOSIS — F319 Bipolar disorder, unspecified: Secondary | ICD-10-CM | POA: Diagnosis not present

## 2021-05-16 DIAGNOSIS — F902 Attention-deficit hyperactivity disorder, combined type: Secondary | ICD-10-CM | POA: Diagnosis not present

## 2021-05-24 ENCOUNTER — Telehealth (INDEPENDENT_AMBULATORY_CARE_PROVIDER_SITE_OTHER): Payer: 59 | Admitting: Family Medicine

## 2021-05-24 DIAGNOSIS — R110 Nausea: Secondary | ICD-10-CM

## 2021-05-24 MED ORDER — ONDANSETRON 4 MG PO TBDP: 4.0000 mg | ORAL_TABLET | Freq: Three times a day (TID) | ORAL | 0 refills | Status: DC | PRN

## 2021-05-24 NOTE — Progress Notes (Signed)
Virtual Visit via Video note  I connected with [name] on 05/24/21 at 4:10 PM by video and verified that I am speaking with the correct person using two identifiers. [name] is currently located at home and nobody is currently with her during visit. The provider, Loman Brooklyn, FNP is located in their office at time of visit.  I discussed the limitations, risks, security and privacy concerns of performing an evaluation and management service by video and the availability of in person appointments. I also discussed with the patient that there may be a patient responsible charge related to this service. The patient expressed understanding and agreed to proceed.  Subjective: PCP: Loman Brooklyn, FNP  Chief Complaint  Patient presents with   Nausea   Patient reports nausea started 2 days ago. She has been unable to eat. She is drinking a moderate amount. Denies fever, vomiting, and urinary symptoms. She did have an "upset stomach" when asked about diarrhea. She is urinating per her usual.    ROS: Per HPI  Current Outpatient Medications:    amphetamine-dextroamphetamine (ADDERALL) 20 MG tablet, Take 1 tablet(s) by mouth every morning and 1/2 tablet daily after lunch for ADHD, Disp: 45 tablet, Rfl: 0   amphetamine-dextroamphetamine (ADDERALL) 20 MG tablet, Take 1 tablet by mouth every morning and 1/2 tablet daily after lunch for ADHD, Disp: 45 tablet, Rfl: 0   ARIPiprazole (ABILIFY) 2 MG tablet, TAKE 1 TABLET BY MOUTH AT BEDTIME FOR OCD, Disp: 30 tablet, Rfl: 2   benzonatate (TESSALON PERLES) 100 MG capsule, Take 1 capsule (100 mg total) by mouth 3 (three) times daily as needed., Disp: 20 capsule, Rfl: 0   buPROPion (WELLBUTRIN XL) 150 MG 24 hr tablet, Take 1 tablet(s) by mouth daily for depression, Disp: 30 tablet, Rfl: 2   COVID-19 At Home Antigen Test (CARESTART COVID-19 HOME TEST) KIT, Use as directed, Disp: 4 each, Rfl: 0   fenofibrate (TRICOR) 48 MG tablet, Take 1 tablet  (48 mg total) by mouth daily., Disp: 90 tablet, Rfl: 1   FLUoxetine (PROZAC) 40 MG capsule, Take 1 capsule(s) by mouth daily for depression/anxiety, Disp: 30 capsule, Rfl: 2   fluticasone (FLONASE) 50 MCG/ACT nasal spray, Place 2 sprays into both nostrils daily., Disp: 16 g, Rfl: 6   HYDROcodone-acetaminophen (NORCO/VICODIN) 5-325 MG tablet, Take one tablet by mouth every 6-8 hours as needed for chronic back pain (fill 05/03/21), Disp: 105 tablet, Rfl: 0   mirabegron ER (MYRBETRIQ) 50 MG TB24 tablet, Take 1 tablet (50 mg total) by mouth daily., Disp: 30 tablet, Rfl: 11   olopatadine (PATADAY) 0.1 % ophthalmic solution, Place 1 drop into both eyes 2 (two) times daily., Disp: 5 mL, Rfl: 12   pregabalin (LYRICA) 75 MG capsule, TAKE 1 CAPSULE BY MOUTH 2 TIMES DAILY, Disp: 60 capsule, Rfl: 2   rizatriptan (MAXALT) 5 MG tablet, Take 1 tablet (5 mg total) by mouth as needed for migraine. 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    Appearance: Normal appearance. She is not ill-appearing or toxic-appearing.  Eyes:     General: No scleral icterus.       Right eye: No discharge.        Left eye: No discharge.     Conjunctiva/sclera: Conjunctivae normal.  Pulmonary:     Effort: Pulmonary effort is normal. No respiratory distress.   Neurological:     Mental Status: She is alert and oriented to person, place, and time.  Psychiatric:        Mood and Affect: Mood normal.        Behavior: Behavior normal.        Thought Content: Thought content normal.        Judgment: Judgment normal.   Assessment and Plan: 1. Nausea without vomiting - ondansetron (ZOFRAN ODT) 4 MG disintegrating tablet; Take 1 tablet (4 mg total) by mouth every 8 (eight) hours as needed for nausea or vomiting.  Dispense: 30 tablet; Refill: 0   Follow Up Instructions:   I discussed the assessment and treatment plan with the patient. The patient was provided an opportunity to ask questions and all were answered. The patient agreed with the plan and demonstrated an understanding of the instructions.   The patient was advised to call back or seek an in-person evaluation if the symptoms worsen or if the condition fails to improve as anticipated.  The above assessment and management plan was discussed with the patient. The patient verbalized understanding of and has agreed to the management plan. Patient is aware to call the clinic if symptoms persist or worsen. Patient is aware when to return to the clinic for a follow-up visit. Patient educated on when it is appropriate to go to the emergency department.   Time call ended: 4:21 PM  I provided 11 minutes of face-to-face time during this encounter.   Hendricks Limes, MSN, APRN, FNP-C [facility] 05/24/21

## 2021-05-31 ENCOUNTER — Other Ambulatory Visit (HOSPITAL_COMMUNITY): Payer: Self-pay

## 2021-05-31 MED ORDER — CARESTART COVID-19 HOME TEST VI KIT
PACK | 0 refills | Status: DC
  Filled 2021-05-31: qty 4, 4d supply, fill #0

## 2021-05-31 MED ORDER — HYDROCODONE-ACETAMINOPHEN 5-325 MG PO TABS
ORAL_TABLET | ORAL | 0 refills | Status: DC
  Filled 2021-05-31: qty 105, 26d supply, fill #0

## 2021-06-02 ENCOUNTER — Encounter: Payer: Self-pay | Admitting: Family Medicine

## 2021-06-03 ENCOUNTER — Ambulatory Visit: Payer: 59 | Admitting: Family Medicine

## 2021-06-03 DIAGNOSIS — R110 Nausea: Secondary | ICD-10-CM

## 2021-06-03 MED ORDER — OMEPRAZOLE 20 MG PO CPDR: 20.0000 mg | DELAYED_RELEASE_CAPSULE | Freq: Every day | ORAL | 2 refills | Status: DC

## 2021-06-03 MED ORDER — ONDANSETRON HCL 4 MG PO TABS: 4.0000 mg | ORAL_TABLET | Freq: Three times a day (TID) | ORAL | 0 refills | Status: DC | PRN

## 2021-06-03 NOTE — Progress Notes (Signed)
   Virtual Visit  Note Due to COVID-19 pandemic this visit was conducted virtually. This visit type was conducted due to national recommendations for restrictions regarding the COVID-19 Pandemic (e.g. social distancing, sheltering in place) in an effort to limit this patient's exposure and mitigate transmission in our community. All issues noted in this document were discussed and addressed.  A physical exam was not performed with this format.  I connected with Nelva Bush Pyka on 06/03/21 at 1550 by telephone and verified that I am speaking with the correct person using two identifiers. Nelva Bush Hawker is currently located at home and no one is currently with her during the visit. The provider, Gwenlyn Perking, FNP is located in their office at time of visit.  I discussed the limitations, risks, security and privacy concerns of performing an evaluation and management service by telephone and the availability of in person appointments. I also discussed with the patient that there may be a patient responsible charge related to this service. The patient expressed understanding and agreed to proceed.  CC: nausea  History and Present Illness:  HPI Ravinder reports nausea for the last few weeks. The nausea occurs about 2-3 hours after eating. It is sometimes worse in the evenings. She denies fever, abdominal pain, vomiting,  weight loss, or diarrhea. She does have some intermittent hearburn depending on what she eats. She has tried zofran with good relief, but is now out. She would like to be tested for h. Pylori. She has a [facility] on site of her place of work and they are able to do the urea breath test there.     ROS As per HPI.   Observations/Objective: Alert and oriented x 3. Able to speak in full sentences without difficulty.    Assessment and Plan: [name] was seen today for nausea.  Diagnoses and all orders for this visit:  Nausea without vomiting Breath test ordered. Will try omeprazole.  Refill provided on zofran. Return to office for new or worsening symptoms, or if symptoms persist.  -     H. pylori breath test; Future -     omeprazole (PRILOSEC) 20 MG capsule; Take 1 capsule (20 mg total) by mouth daily. -     ondansetron (ZOFRAN) 4 MG tablet; Take 1 tablet (4 mg total) by mouth every 8 (eight) hours as needed for nausea or vomiting.    Follow Up Instructions: As needed.     I discussed the assessment and treatment plan with the patient. The patient was provided an opportunity to ask questions and all were answered. The patient agreed with the plan and demonstrated an understanding of the instructions.   The patient was advised to call back or seek an in-person evaluation if the symptoms worsen or if the condition fails to improve as anticipated.  The above assessment and management plan was discussed with the patient. The patient verbalized understanding of and has agreed to the management plan. Patient is aware to call the clinic if symptoms persist or worsen. Patient is aware when to return to the clinic for a follow-up visit. Patient educated on when it is appropriate to go to the emergency department.   Time call ended:  1602  I provided 12 minutes of  non face-to-face time during this encounter.    Gwenlyn Perking, FNP

## 2021-06-04 ENCOUNTER — Encounter: Payer: Self-pay | Admitting: Family Medicine

## 2021-06-04 ENCOUNTER — Other Ambulatory Visit: Payer: Self-pay | Admitting: Family Medicine

## 2021-06-04 ENCOUNTER — Other Ambulatory Visit: Payer: Self-pay | Admitting: *Deleted

## 2021-06-04 DIAGNOSIS — R110 Nausea: Secondary | ICD-10-CM

## 2021-06-06 LAB — H. PYLORI BREATH TEST: H pylori Breath Test: NEGATIVE

## 2021-06-07 ENCOUNTER — Ambulatory Visit: Payer: 59 | Admitting: Obstetrics & Gynecology

## 2021-06-07 ENCOUNTER — Other Ambulatory Visit: Payer: 59

## 2021-06-10 ENCOUNTER — Other Ambulatory Visit: Payer: Self-pay

## 2021-06-10 ENCOUNTER — Encounter: Payer: Self-pay | Admitting: Obstetrics & Gynecology

## 2021-06-10 ENCOUNTER — Ambulatory Visit: Payer: 59 | Admitting: Obstetrics & Gynecology

## 2021-06-10 ENCOUNTER — Other Ambulatory Visit (HOSPITAL_COMMUNITY): Payer: Self-pay

## 2021-06-10 ENCOUNTER — Ambulatory Visit (INDEPENDENT_AMBULATORY_CARE_PROVIDER_SITE_OTHER): Payer: 59

## 2021-06-10 VITALS — BP 114/80 | HR 101 | Ht 65.0 in | Wt 168.5 lb

## 2021-06-10 DIAGNOSIS — N814 Uterovaginal prolapse, unspecified: Secondary | ICD-10-CM | POA: Diagnosis not present

## 2021-06-10 DIAGNOSIS — N3946 Mixed incontinence: Secondary | ICD-10-CM | POA: Diagnosis not present

## 2021-06-10 DIAGNOSIS — D219 Benign neoplasm of connective and other soft tissue, unspecified: Secondary | ICD-10-CM | POA: Diagnosis not present

## 2021-06-10 MED ORDER — SOLIFENACIN SUCCINATE 10 MG PO TABS
10.0000 mg | ORAL_TABLET | Freq: Every day | ORAL | 11 refills | Status: DC
  Filled 2021-06-10: qty 30, 30d supply, fill #0

## 2021-06-10 NOTE — Progress Notes (Signed)
Follow up appointment for results  Chief Complaint  Patient presents with   discuss [place] results    Blood pressure 114/80, pulse (!) 101, height '5\' 5"'$  (1.651 m), weight 168 lb 8 oz (76.4 kg), last menstrual period 06/06/2021.  US PELVIS TRANSVAGINAL NON-OB (TV ONLY)  Result Date: 06/10/2021 Images from the original result were not included.  Center for [facility] [address] [place] [address]                                                                                                  GYNECOLOGIC SONOGRAM [name] is a 40 y.o. [id number] Patient's last menstrual period was 06/06/2021. She is here for a pelvic sonogram for ? enlarged uterus,frequent urination. Uterus                      7.7 x 5.1 x 6.7 cm, Total uterine volume 137 cc,heterogeneous anteverted uterus,wnl,complex nabothian cyst 1.2 x 1.3 x .9 cm Endometrium          4.9 mm, symmetrical, wnl Right ovary             2.5 x 1.9 x 2.7 cm, wnl Left ovary                2.5 x 3 x 2.3 cm, wnl,simple dominate follicle 2.3 x 1.7 x 2.2 cm No free fluid Technician Comments: PELVIC US TA/TV: heterogeneous anteverted uterus,wnl,complex nabothian cyst 1.2 x 1.3 x .9 cm,EEC 4.9 mm,normal ovaries,no free fluid,no pain during ultrasound Chaperone [address] Heide Guile 06/10/2021 9:05 AM Clinical Impression and recommendations: I have reviewed the sonogram results above, combined with the patient's current clinical course, below are my impressions and any appropriate recommendations for management based on the sonographic findings. Uterus is normal size shape and contour for her age and parity Endometrium normal for proliferative phase/menstrual phase of her cycle Both ovaries are normal in size shape and morphology, physiologic cyst on the left Impression is a normal sonogram Florian Buff 06/10/2021 9:23 AM   US PELVIS (TRANSABDOMINAL ONLY)  Result Date: 06/10/2021 Images from the original result were not included.  Center  for [facility] @ [facility] [address] [place] [address]                                                                                                  GYNECOLOGIC SONOGRAM [name] is a 40 y.o. [id number] Patient's last menstrual period was 06/06/2021. She is here for a pelvic sonogram for ? enlarged uterus,frequent urination. Uterus  7.7 x 5.1 x 6.7 cm, Total uterine volume 137 cc,heterogeneous anteverted uterus,wnl,complex nabothian cyst 1.2 x 1.3 x .9 cm Endometrium          4.9 mm, symmetrical, wnl Right ovary             2.5 x 1.9 x 2.7 cm, wnl Left ovary                2.5 x 3 x 2.3 cm, wnl,simple dominate follicle 2.3 x 1.7 x 2.2 cm No free fluid Technician Comments: PELVIC US TA/TV: heterogeneous anteverted uterus,wnl,complex nabothian cyst 1.2 x 1.3 x .9 cm,EEC 4.9 mm,normal ovaries,no free fluid,no pain during ultrasound Chaperone [address] Heide Guile 06/10/2021 9:05 AM Clinical Impression and recommendations: I have reviewed the sonogram results above, combined with the patient's current clinical course, below are my impressions and any appropriate recommendations for management based on the sonographic findings. Uterus is normal size shape and contour for her age and parity Endometrium normal for proliferative phase/menstrual phase of her cycle Both ovaries are normal in size shape and morphology, physiologic cyst on the left Impression is a normal sonogram Florian Buff 06/10/2021 9:23 AM      MEDS ordered this encounter: Meds ordered this encounter  Medications   solifenacin (VESICARE) 10 MG tablet    Sig: Take 1 tablet (10 mg total) by mouth daily. At bedtime    Dispense:  30 tablet    Refill:  11    Orders for this encounter: No orders of the defined types were placed in this encounter.   Impression:   ICD-10-CM   1. 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 Uterine prolapse, Grade 2  N81.4    normal sonogram today, uterus 138cc       Plan: Myrbetriq was too expensive will switch to vesicare which seems to be $5/month  No indication for surgical intervention at this point, but pt aware due to her relaxation may be in the future  Follow Up: Return if symptoms worsen or fail to improve, for Diona Foley is in patient's court, sned mychart message if needed and for repsonse.      All questions were answered.  Past Medical History:  Diagnosis Date   Back pain    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, nerve impingment    Bipolar 1 disorder, mixed ([place])    Depression    Hair pulling    HSIL on Pap smear of cervix 2013   [place]   Migraines     Past Surgical History:  Procedure Laterality Date   APPENDECTOMY  2019   CHOLECYSTECTOMY     LAPAROSCOPIC APPENDECTOMY N/A 05/03/2018   Procedure: APPENDECTOMY LAPAROSCOPIC;  Surgeon: Greer Pickerel, MD;  Location: [facility];  Service: General;  Laterality: N/A;   TUBAL LIGATION      OB History     Gravida  3   Para  3   Term  3   Preterm      AB      Living  3      SAB      IAB      Ectopic      Multiple      Live Births  3           Allergies  Allergen Reactions   Lamictal [Lamotrigine]  Knots in throat and back of head    Social History   Socioeconomic History   Marital status: Married    Spouse name: Not on file   Number of children: 3   Years of education: Not on file   Highest education level: Not on file  Occupational History   Not on file  Tobacco Use   Smoking status: Former    Packs/day: 0.50    Types: Cigarettes    Quit date: 06/08/2019    Years since quitting: 2.0   Smokeless tobacco: Never  Vaping Use   Vaping Use: Some days  Substance and Sexual Activity   Alcohol use: Yes    Alcohol/week: 0.0 standard drinks    Comment: occ   Drug use: No   Sexual activity: Yes    Birth control/protection: Surgical    Comment:  tubal  Other Topics Concern   Not on file  Social History Narrative   Not on file   Social Determinants of Health   Financial Resource Strain: Low Risk    Difficulty of Paying Living Expenses: Not hard at all  Food Insecurity: No Food Insecurity   Worried About Charity fundraiser in the Last Year: Never true   [place] in the Last Year: Never true  Transportation Needs: No Transportation Needs   Lack of Transportation (Medical): No   Lack of Transportation (Non-Medical): No  Physical Activity: Sufficiently Active   Days of Exercise per Week: 5 days   Minutes of Exercise per Session: 30 min  Stress: No Stress Concern Present   Feeling of Stress : Only a little  Social Connections: Moderately Integrated   Frequency of Communication with Friends and Family: More than three times a week   Frequency of Social Gatherings with Friends and Family: Once a week   Attends Religious Services: 1 to 4 times per year   Active Member of [employer] or Organizations: No   Attends Archivist Meetings: Never   Marital Status: Married    Family History  Problem Relation Age of Onset   Heart disease Mother    COPD Mother    Lung cancer Mother    Alcohol abuse Father    Bipolar disorder Father    Prostate cancer Father    Melanoma Paternal Grandmother

## 2021-06-10 NOTE — Progress Notes (Signed)
PELVIC US TA/TV: heterogeneous anteverted uterus,wnl,complex nabothian cyst 1.2 x 1.3 x .9 cm,EEC 4.9 mm,normal ovaries,no free fluid,no pain during ultrasound

## 2021-06-11 ENCOUNTER — Other Ambulatory Visit (HOSPITAL_COMMUNITY): Payer: Self-pay

## 2021-06-20 ENCOUNTER — Telehealth: Payer: 59 | Admitting: Family Medicine

## 2021-06-20 DIAGNOSIS — J309 Allergic rhinitis, unspecified: Secondary | ICD-10-CM

## 2021-06-20 DIAGNOSIS — H1013 Acute atopic conjunctivitis, bilateral: Secondary | ICD-10-CM | POA: Diagnosis not present

## 2021-06-20 DIAGNOSIS — R059 Cough, unspecified: Secondary | ICD-10-CM

## 2021-06-20 MED ORDER — FLUTICASONE PROPIONATE 50 MCG/ACT NA SUSP: 2.0000 | Freq: Every day | NASAL | 6 refills | Status: DC

## 2021-06-20 MED ORDER — OLOPATADINE HCL 0.1 % OP SOLN: 1.0000 [drp] | Freq: Two times a day (BID) | OPHTHALMIC | 12 refills | Status: DC

## 2021-06-20 NOTE — Progress Notes (Signed)
E visit for Allergic Rhinitis We are sorry that you are not feeling well.  Here is how we plan to help!  Based on what you have shared with me it looks like you have Allergic Rhinitis.  Rhinitis is when a reaction occurs that causes nasal congestion, runny nose, sneezing, and itching.  Most types of rhinitis are caused by an inflammation and are associated with symptoms in the eyes ears or throat. There are several types of rhinitis.  The most common are acute rhinitis, which is usually caused by a viral illness, allergic or seasonal rhinitis, and nonallergic or year-round rhinitis.  Nasal allergies occur certain times of the year.  Allergic rhinitis is caused when allergens in the air trigger the release of histamine in the body.  Histamine causes itching, swelling, and fluid to build up in the fragile linings of the nasal passages, sinuses and eyelids.  An itchy nose and clear discharge are common.  I recommend the following over the counter treatments: You should take a daily dose of antihistamine  I also would recommend a nasal spray: Flonase 2 sprays into each nostril once daily  You may also benefit from eye drops such as: Systane 1-2 driops each eye twice daily as needed And   HOME CARE:  You can use an over-the-counter saline nasal spray as needed Avoid areas where there is heavy dust, mites, or molds Stay indoors on windy days during the pollen season Keep windows closed in home, at least in bedroom; use air conditioner. Use high-efficiency house air filter Keep windows closed in car, turn AC on re-circulate Avoid playing out with dog during pollen season  GET HELP RIGHT AWAY IF:  If your symptoms do not improve within 10 days You become short of breath You develop yellow or green discharge from your nose for over 3 days You have coughing fits  MAKE SURE YOU:  Understand these instructions Will watch your condition Will get help right away if you are not doing well or  get worse  Thank you for choosing an e-visit. Your e-visit answers were reviewed by a board certified advanced clinical practitioner to complete your personal care plan. Depending upon the condition, your plan could have included both over the counter or prescription medications. Please review your pharmacy choice. Be sure that the pharmacy you have chosen is open so that you can pick up your prescription now.  If there is a problem you may message your provider in [place] to have the prescription routed to another pharmacy. Your safety is important to [place]. If you have drug allergies check your prescription carefully.  For the next 24 hours, you can use MyChart to ask questions about today's visit, request a non-urgent call back, or ask for a work or school excuse from your e-visit provider. You will get an email in the next two days asking about your experience. I hope that your e-visit has been valuable and will speed your recovery.      I provided 5 minutes of non face-to-face time during this encounter for chart review, medication and order placement, as well as and documentation.

## 2021-06-28 ENCOUNTER — Other Ambulatory Visit (HOSPITAL_COMMUNITY): Payer: Self-pay

## 2021-06-28 MED ORDER — HYDROCODONE-ACETAMINOPHEN 5-325 MG PO TABS
ORAL_TABLET | ORAL | 0 refills | Status: DC
  Filled 2021-06-28: qty 105, 26d supply, fill #0

## 2021-07-18 ENCOUNTER — Ambulatory Visit (INDEPENDENT_AMBULATORY_CARE_PROVIDER_SITE_OTHER): Payer: 59 | Admitting: Family

## 2021-07-18 ENCOUNTER — Other Ambulatory Visit: Payer: Self-pay | Admitting: Family

## 2021-07-18 ENCOUNTER — Encounter: Payer: Self-pay | Admitting: Family

## 2021-07-18 DIAGNOSIS — R059 Cough, unspecified: Secondary | ICD-10-CM | POA: Diagnosis not present

## 2021-07-18 DIAGNOSIS — Z8616 Personal history of COVID-19: Secondary | ICD-10-CM | POA: Diagnosis not present

## 2021-07-18 MED ORDER — BENZONATATE 200 MG PO CAPS: 200.0000 mg | ORAL_CAPSULE | Freq: Three times a day (TID) | ORAL | 1 refills | Status: DC | PRN

## 2021-07-18 MED ORDER — PREDNISONE 10 MG (21) PO TBPK: ORAL_TABLET | ORAL | 0 refills | Status: DC

## 2021-07-18 MED ORDER — PROMETHAZINE-DM 6.25-15 MG/5ML PO SYRP
5.0000 mL | ORAL_SOLUTION | Freq: Three times a day (TID) | ORAL | 0 refills | Status: DC | PRN
Start: 2021-07-18 — End: 2021-11-26

## 2021-07-18 NOTE — Progress Notes (Signed)
Virtual Visit Consent   [name], you are scheduled for a virtual visit with a [facility] provider today.     Just as with appointments in the office, your consent must be obtained to participate.  Your consent will be active for this visit and any virtual visit you may have with one of our providers in the next 365 days.     If you have a MyChart account, a copy of this consent can be sent to you electronically.  All virtual visits are billed to your insurance company just like a traditional visit in the office.    As this is a virtual visit, video technology does not allow for your provider to perform a traditional examination.  This may limit your provider's ability to fully assess your condition.  If your provider identifies any concerns that need to be evaluated in person or the need to arrange testing (such as labs, EKG, etc.), we will make arrangements to do so.     Although advances in technology are sophisticated, we cannot ensure that it will always work on either your end or our end.  If the connection with a video visit is poor, the visit may have to be switched to a telephone visit.  With either a video or telephone visit, we are not always able to ensure that we have a secure connection.     I need to obtain your verbal consent now.   Are you willing to proceed with your visit today?    [name] has provided verbal consent on 07/18/2021 for a virtual visit (video or telephone).   Evelina Dun, FNP   Date: 07/18/2021 9:54 AM   Virtual Visit via Video Note   I, Evelina Dun, connected with  [name]  ([id number], [birth year]) on 07/18/21 at  9:10 AM EDT by a video-enabled telemedicine application and verified that I am speaking with the correct person using two identifiers.  Location: Patient: Virtual Visit Location Patient: work Provider: Virtual Visit Location Provider: Home   I discussed the limitations of evaluation and management by telemedicine and the  availability of in person appointments. The patient expressed understanding and agreed to proceed.    History of Present Illness: [name] is a 40 y.o. who identifies as a female who was assigned female at birth, and is being seen today for cough post COVID. She had COVID on 07/11/21 with sore throat, fever, and fatigue. These resolved on 07/17/21, but has coarse cough.   HPI: Cough This is a new problem. The current episode started in the past 7 days. The problem has been gradually worsening. The problem occurs every few minutes. The cough is Non-productive. Associated symptoms include nasal congestion, postnasal drip and rhinorrhea. Pertinent negatives include no chills, ear congestion, ear pain, fever (resolved), headaches, sore throat (resolved), shortness of breath or wheezing. She has tried rest and OTC cough suppressant for the symptoms. The treatment provided mild relief.   Problems:  Patient Active Problem List   Diagnosis Date Noted   Mixed incontinence 03/17/2021   Hyperlipidemia 08/17/2020   ADD (attention deficit disorder) 01/29/2016   Migraines 09/18/2015   Lumbar back pain with radiculopathy affecting right lower extremity 07/31/2015   HSIL (high grade squamous intraepithelial lesion) on Pap smear of cervix 08/22/2013   Bipolar I disorder ([place]) 08/22/2013   Bulging lumbar disc 08/17/2012    Allergies:  Allergies  Allergen Reactions   Lamictal [Lamotrigine]     Knots  in throat and back of head   Medications:  Current Outpatient Medications:    benzonatate (TESSALON) 200 MG capsule, Take 1 capsule (200 mg total) by mouth 3 (three) times daily as needed., Disp: 30 capsule, Rfl: 1   predniSONE (STERAPRED UNI-PAK 21 TAB) 10 MG (21) TBPK tablet, Use as directed, Disp: 21 tablet, Rfl: 0   promethazine-dextromethorphan (PROMETHAZINE-DM) 6.25-15 MG/5ML syrup, Take 5 mLs by mouth 3 (three) times daily as needed for cough., Disp: 118 mL, Rfl: 0   amphetamine-dextroamphetamine  (ADDERALL) 20 MG tablet, Take 1 tablet(s) by mouth every morning and 1/2 tablet daily after lunch for ADHD, Disp: 45 tablet, Rfl: 0   amphetamine-dextroamphetamine (ADDERALL) 20 MG tablet, Take 1 tablet by mouth every morning and 1/2 tablet daily after lunch for ADHD, Disp: 45 tablet, Rfl: 0   ARIPiprazole (ABILIFY) 2 MG tablet, TAKE 1 TABLET BY MOUTH AT BEDTIME FOR OCD, Disp: 30 tablet, Rfl: 2   buPROPion (WELLBUTRIN XL) 150 MG 24 hr tablet, Take 1 tablet(s) by mouth daily for depression, Disp: 30 tablet, Rfl: 2   fenofibrate (TRICOR) 48 MG tablet, Take 1 tablet (48 mg total) by mouth daily., Disp: 90 tablet, Rfl: 1   FLUoxetine (PROZAC) 40 MG capsule, Take 1 capsule(s) by mouth daily for depression/anxiety, Disp: 30 capsule, Rfl: 2   fluticasone (FLONASE) 50 MCG/ACT nasal spray, Place 2 sprays into both nostrils daily., Disp: 16 g, Rfl: 6   HYDROcodone-acetaminophen (NORCO/VICODIN) 5-325 MG tablet, Take one tablet by mouth every 6-8 hours as needed for chronic back pain, Disp: 105 tablet, Rfl: 0   olopatadine (PATANOL) 0.1 % ophthalmic solution, Place 1 drop into both eyes 2 (two) times daily., Disp: 5 mL, Rfl: 12   omeprazole (PRILOSEC) 20 MG capsule, Take 1 capsule (20 mg total) by mouth daily., Disp: 30 capsule, Rfl: 2   pregabalin (LYRICA) 75 MG capsule, TAKE 1 CAPSULE BY MOUTH 2 TIMES DAILY, Disp: 60 capsule, Rfl: 2   rizatriptan (MAXALT) 5 MG tablet, Take 1 tablet (5 mg total) by mouth as needed for migraine. May repeat in 2 hours if needed, Disp: 10 tablet, Rfl: 2   rosuvastatin (CRESTOR) 10 MG tablet, Take 1 tablet (10 mg total) by mouth daily., Disp: 90 tablet, Rfl: 1   solifenacin (VESICARE) 10 MG tablet, Take 1 tablet (10 mg total) by mouth daily. At bedtime, Disp: 30 tablet, Rfl: 11   topiramate (TOPAMAX) 50 MG tablet, Take 1 tablet (50 mg total) by mouth at bedtime., Disp: 90 tablet, Rfl: 3  Observations/Objective: Patient is well-developed, well-nourished in no acute distress.   Resting comfortably  at home.  Head is normocephalic, atraumatic.  No labored breathing. Speech is clear and coherent with logical content.  Patient is alert and oriented at baseline.  Nasal congestion, intermittent dry cough  Assessment and Plan: 1. History of COVID-19 - predniSONE (STERAPRED UNI-PAK 21 TAB) 10 MG (21) TBPK tablet; Use as directed  Dispense: 21 tablet; Refill: 0 - benzonatate (TESSALON) 200 MG capsule; Take 1 capsule (200 mg total) by mouth 3 (three) times daily as needed.  Dispense: 30 capsule; Refill: 1 - promethazine-dextromethorphan (PROMETHAZINE-DM) 6.25-15 MG/5ML syrup; Take 5 mLs by mouth 3 (three) times daily as needed for cough.  Dispense: 118 mL; Refill: 0  2. Cough - predniSONE (STERAPRED UNI-PAK 21 TAB) 10 MG (21) TBPK tablet; Use as directed  Dispense: 21 tablet; Refill: 0 - benzonatate (TESSALON) 200 MG capsule; Take 1 capsule (200 mg total) by mouth 3 (three) times daily  as needed.  Dispense: 30 capsule; Refill: 1 - promethazine-dextromethorphan (PROMETHAZINE-DM) 6.25-15 MG/5ML syrup; Take 5 mLs by mouth 3 (three) times daily as needed for cough.  Dispense: 118 mL; Refill: 0 - Take meds as prescribed - Use a cool mist humidifier  -Use saline nose sprays frequently -Force fluids -For any cough or congestion  Use plain Mucinex- regular strength or max strength is fine -For fever or aces or pains- take tylenol or ibuprofen. -Throat lozenges if help RTO if symptoms or worsen   Follow Up Instructions: I discussed the assessment and treatment plan with the patient. The patient was provided an opportunity to ask questions and all were answered. The patient agreed with the plan and demonstrated an understanding of the instructions.  A copy of instructions were sent to the patient via MyChart unless otherwise noted below.     The patient was advised to call back or seek an in-person evaluation if the symptoms worsen or if the condition fails to improve as  anticipated.  Time:  I spent 12 minutes with the patient via telehealth technology discussing the above problems/concerns.    Evelina Dun, FNP

## 2021-07-26 ENCOUNTER — Other Ambulatory Visit (HOSPITAL_COMMUNITY): Payer: Self-pay

## 2021-07-26 MED ORDER — HYDROCODONE-ACETAMINOPHEN 5-325 MG PO TABS
ORAL_TABLET | ORAL | 0 refills | Status: DC
  Filled 2021-07-26: qty 30, 8d supply, fill #0

## 2021-07-30 ENCOUNTER — Other Ambulatory Visit (HOSPITAL_COMMUNITY): Payer: Self-pay

## 2021-07-30 MED ORDER — HYDROCODONE-ACETAMINOPHEN 5-325 MG PO TABS
ORAL_TABLET | ORAL | 0 refills | Status: DC
  Filled 2021-08-05: qty 30, 7d supply, fill #0

## 2021-08-05 ENCOUNTER — Other Ambulatory Visit (HOSPITAL_COMMUNITY): Payer: Self-pay

## 2021-08-13 ENCOUNTER — Other Ambulatory Visit (HOSPITAL_COMMUNITY): Payer: Self-pay

## 2021-08-13 DIAGNOSIS — M5416 Radiculopathy, lumbar region: Secondary | ICD-10-CM | POA: Diagnosis not present

## 2021-08-13 DIAGNOSIS — M961 Postlaminectomy syndrome, not elsewhere classified: Secondary | ICD-10-CM | POA: Diagnosis not present

## 2021-08-13 DIAGNOSIS — Z6828 Body mass index (BMI) 28.0-28.9, adult: Secondary | ICD-10-CM | POA: Diagnosis not present

## 2021-08-13 MED ORDER — HYDROCODONE-ACETAMINOPHEN 5-325 MG PO TABS
ORAL_TABLET | ORAL | 0 refills | Status: DC
  Filled 2021-08-13: qty 90, 30d supply, fill #0

## 2021-08-13 MED ORDER — HYDROCODONE-ACETAMINOPHEN 5-325 MG PO TABS
ORAL_TABLET | ORAL | 0 refills | Status: DC
  Filled 2021-10-11: qty 90, 30d supply, fill #0

## 2021-08-13 MED ORDER — GABAPENTIN 300 MG PO CAPS
ORAL_CAPSULE | ORAL | 3 refills | Status: DC
  Filled 2021-08-13: qty 180, 90d supply, fill #0

## 2021-08-13 MED ORDER — HYDROCODONE-ACETAMINOPHEN 5-325 MG PO TABS
ORAL_TABLET | ORAL | 0 refills | Status: DC
  Filled 2021-09-13: qty 90, 30d supply, fill #0

## 2021-08-19 ENCOUNTER — Encounter: Payer: Self-pay | Admitting: Family Medicine

## 2021-08-19 DIAGNOSIS — F902 Attention-deficit hyperactivity disorder, combined type: Secondary | ICD-10-CM | POA: Diagnosis not present

## 2021-08-19 DIAGNOSIS — F411 Generalized anxiety disorder: Secondary | ICD-10-CM | POA: Diagnosis not present

## 2021-08-19 DIAGNOSIS — F331 Major depressive disorder, recurrent, moderate: Secondary | ICD-10-CM | POA: Diagnosis not present

## 2021-08-19 DIAGNOSIS — F429 Obsessive-compulsive disorder, unspecified: Secondary | ICD-10-CM | POA: Diagnosis not present

## 2021-08-20 ENCOUNTER — Other Ambulatory Visit (HOSPITAL_COMMUNITY): Payer: Self-pay

## 2021-08-20 MED ORDER — FLUOXETINE HCL 40 MG PO CAPS
ORAL_CAPSULE | ORAL | 0 refills | Status: DC
  Filled 2021-08-20: qty 90, 90d supply, fill #0

## 2021-08-20 MED ORDER — ARIPIPRAZOLE 2 MG PO TABS
ORAL_TABLET | ORAL | 0 refills | Status: DC
  Filled 2021-08-20: qty 90, 90d supply, fill #0

## 2021-08-20 MED ORDER — BUPROPION HCL ER (XL) 150 MG PO TB24
ORAL_TABLET | ORAL | 0 refills | Status: DC
  Filled 2021-08-20: qty 90, 90d supply, fill #0

## 2021-09-09 ENCOUNTER — Other Ambulatory Visit (HOSPITAL_COMMUNITY): Payer: Self-pay

## 2021-09-09 DIAGNOSIS — M5416 Radiculopathy, lumbar region: Secondary | ICD-10-CM | POA: Diagnosis not present

## 2021-09-13 ENCOUNTER — Other Ambulatory Visit (HOSPITAL_COMMUNITY): Payer: Self-pay

## 2021-10-03 ENCOUNTER — Ambulatory Visit: Payer: 59 | Admitting: Family Medicine

## 2021-10-11 ENCOUNTER — Other Ambulatory Visit (HOSPITAL_COMMUNITY): Payer: Self-pay

## 2021-10-24 ENCOUNTER — Ambulatory Visit (INDEPENDENT_AMBULATORY_CARE_PROVIDER_SITE_OTHER): Payer: 59 | Admitting: Family

## 2021-10-24 ENCOUNTER — Encounter: Payer: Self-pay | Admitting: Family

## 2021-10-24 DIAGNOSIS — J029 Acute pharyngitis, unspecified: Secondary | ICD-10-CM

## 2021-10-24 MED ORDER — AMOXICILLIN 500 MG PO CAPS: 500.0000 mg | ORAL_CAPSULE | Freq: Two times a day (BID) | ORAL | 0 refills | Status: AC

## 2021-10-24 NOTE — Progress Notes (Signed)
Virtual Visit  Note Due to COVID-19 pandemic this visit was conducted virtually. This visit type was conducted due to national recommendations for restrictions regarding the COVID-19 Pandemic (e.g. social distancing, sheltering in place) in an effort to limit this patient's exposure and mitigate transmission in our community. All issues noted in this document were discussed and addressed.  A physical exam was not performed with this format.  I connected with [name] on 10/24/21 at 12:48 pm  by telephone and verified that I am speaking with the correct person using two identifiers. [name] is currently located at home and no one is currently with her during visit. The provider, Evelina Dun, FNP is located in their office at time of visit.  I discussed the limitations, risks, security and privacy concerns of performing an evaluation and management service by telephone and the availability of in person appointments. I also discussed with the patient that there may be a patient responsible charge related to this service. The patient expressed understanding and agreed to proceed.  [name], [name] are scheduled for a virtual visit with your provider today.    Just as we do with appointments in the office, we must obtain your consent to participate.  Your consent will be active for this visit and any virtual visit you may have with one of our providers in the next 365 days.    If you have a MyChart account, I can also send a copy of this consent to you electronically.  All virtual visits are billed to your insurance company just like a traditional visit in the office.  As this is a virtual visit, video technology does not allow for your provider to perform a traditional examination.  This may limit your provider's ability to fully assess your condition.  If your provider identifies any concerns that need to be evaluated in person or the need to arrange testing such as labs, EKG, etc, we will make  arrangements to do so.    Although advances in technology are sophisticated, we cannot ensure that it will always work on either your end or our end.  If the connection with a video visit is poor, we may have to switch to a telephone visit.  With either a video or telephone visit, we are not always able to ensure that we have a secure connection.   I need to obtain your verbal consent now.   Are you willing to proceed with your visit today?   [name] has provided verbal consent on 10/24/2021 for a virtual visit (video or telephone).   Evelina Dun, [place] 10/24/2021  12:46 PM   History and Present Illness:  Sore Throat  This is a new problem. The current episode started yesterday. The problem has been gradually worsening. The maximum temperature recorded prior to her arrival was 101 - 101.9 F. The pain is at a severity of 8/10. The pain is moderate. Associated symptoms include congestion, headaches, a hoarse voice, swollen glands and trouble swallowing. Pertinent negatives include no coughing, ear discharge, ear pain or shortness of breath. Associated symptoms comments: White patches . She has tried NSAIDs for the symptoms. The treatment provided mild relief.     Review of Systems  HENT:  Positive for congestion, hoarse voice and trouble swallowing. Negative for ear discharge and ear pain.   Respiratory:  Negative for cough and shortness of breath.   Neurological:  Positive for headaches.    Observations/Objective: No SOB or distress  noted, hoarse voice   Assessment and Plan: 1. Acute pharyngitis, unspecified etiology - Take meds as prescribed - Use a cool mist humidifier  -Use saline nose sprays frequently -Force fluids -For any cough or congestion  Use plain Mucinex- regular strength or max strength is fine -For fever or aces or pains- take tylenol or ibuprofen. -Throat lozenges if help -New toothbrush in 3 days  Follow up if symptoms worsen or do not improve  - amoxicillin  (AMOXIL) 500 MG capsule; Take 1 capsule (500 mg total) by mouth 2 (two) times daily for 10 days.  Dispense: 20 capsule; Refill: 0     I discussed the assessment and treatment plan with the patient. The patient was provided an opportunity to ask questions and all were answered. The patient agreed with the plan and demonstrated an understanding of the instructions.   The patient was advised to call back or seek an in-person evaluation if the symptoms worsen or if the condition fails to improve as anticipated.  The above assessment and management plan was discussed with the patient. The patient verbalized understanding of and has agreed to the management plan. Patient is aware to call the clinic if symptoms persist or worsen. Patient is aware when to return to the clinic for a follow-up visit. Patient educated on when it is appropriate to go to the emergency department.   Time call ended:  12:59 pm   I provided 11 minutes of  non face-to-face time during this encounter.    Evelina Dun, FNP

## 2021-10-25 ENCOUNTER — Ambulatory Visit: Payer: 59 | Admitting: Family Medicine

## 2021-10-31 ENCOUNTER — Encounter: Payer: Self-pay | Admitting: Family Medicine

## 2021-10-31 ENCOUNTER — Ambulatory Visit: Payer: 59 | Admitting: Family Medicine

## 2021-10-31 DIAGNOSIS — Z1159 Encounter for screening for other viral diseases: Secondary | ICD-10-CM | POA: Diagnosis not present

## 2021-10-31 DIAGNOSIS — R87610 Atypical squamous cells of undetermined significance on cytologic smear of cervix (ASC-US): Secondary | ICD-10-CM

## 2021-10-31 DIAGNOSIS — G43109 Migraine with aura, not intractable, without status migrainosus: Secondary | ICD-10-CM

## 2021-10-31 DIAGNOSIS — Z8616 Personal history of COVID-19: Secondary | ICD-10-CM

## 2021-10-31 DIAGNOSIS — N3946 Mixed incontinence: Secondary | ICD-10-CM | POA: Diagnosis not present

## 2021-10-31 DIAGNOSIS — R051 Acute cough: Secondary | ICD-10-CM

## 2021-10-31 DIAGNOSIS — E782 Mixed hyperlipidemia: Secondary | ICD-10-CM

## 2021-10-31 DIAGNOSIS — R059 Cough, unspecified: Secondary | ICD-10-CM

## 2021-10-31 MED ORDER — TOPIRAMATE 50 MG PO TABS: 50.0000 mg | ORAL_TABLET | Freq: Every day | ORAL | 1 refills | Status: DC

## 2021-10-31 MED ORDER — BENZONATATE 200 MG PO CAPS: 200.0000 mg | ORAL_CAPSULE | Freq: Three times a day (TID) | ORAL | 1 refills | Status: DC | PRN

## 2021-10-31 NOTE — Progress Notes (Signed)
Virtual Visit via Telephone Note  I connected with [name] on 10/31/21 at 3:17 PM by telephone and verified that I am speaking with the correct person using two identifiers. [name] is currently located at work and nobody is currently with her during this visit. The provider, Loman Brooklyn, FNP is located in their office at time of visit.  I discussed the limitations, risks, security and privacy concerns of performing an evaluation and management service by telephone and the availability of in person appointments. I also discussed with the patient that there may be a patient responsible charge related to this service. The patient expressed understanding and agreed to proceed.  Subjective: PCP: Loman Brooklyn, FNP  Chief Complaint  Patient presents with   Medical Management of Chronic Issues   Migraines: taking Topamax 50 mg at bedtime which if effective in preventing migraines. She has Maxalt for abortive therapy. Previously failed treatment with Ubrelvy and Imitrex.   Hyperlipidemia: has prescriptions for rosuvastatin and fenofibrate, but it does not appear she has been getting these filled.  Patient has not been feeling well and is requesting something for cough.   ROS: Per HPI  Current Outpatient Medications:    amoxicillin (AMOXIL) 500 MG capsule, Take 1 capsule (500 mg total) by mouth 2 (two) times daily for 10 days., Disp: 20 capsule, Rfl: 0   amphetamine-dextroamphetamine (ADDERALL) 20 MG tablet, Take 1 tablet(s) by mouth every morning and 1/2 tablet daily after lunch for ADHD, Disp: 45 tablet, Rfl: 0   amphetamine-dextroamphetamine (ADDERALL) 20 MG tablet, Take 1 tablet by mouth every morning and 1/2 tablet daily after lunch for ADHD, Disp: 45 tablet, Rfl: 0   ARIPiprazole (ABILIFY) 2 MG tablet, TAKE 1 TABLET BY MOUTH AT BEDTIME FOR OCD, Disp: 30 tablet, Rfl: 2   ARIPiprazole (ABILIFY) 2 MG tablet, Take 1 tablet by mouth at bedtime for OCD, Disp: 90 tablet, Rfl:  0   benzonatate (TESSALON) 200 MG capsule, Take 1 capsule (200 mg total) by mouth 3 (three) times daily as needed., Disp: 30 capsule, Rfl: 1   buPROPion (WELLBUTRIN XL) 150 MG 24 hr tablet, Take 1 tablet(s) by mouth daily for depression, Disp: 30 tablet, Rfl: 2   buPROPion (WELLBUTRIN XL) 150 MG 24 hr tablet, TAKE 1 TABLET BY MOUTH DAILY FOR DEPRESSION, Disp: 90 tablet, Rfl: 0   fenofibrate (TRICOR) 48 MG tablet, Take 1 tablet (48 mg total) by mouth daily., Disp: 90 tablet, Rfl: 1   FLUoxetine (PROZAC) 40 MG capsule, Take 1 capsule(s) by mouth daily for depression/anxiety, Disp: 30 capsule, Rfl: 2   FLUoxetine (PROZAC) 40 MG capsule, TAKE 1 CAPSULE BY MOUTH DAILY FOR DEPRESSION/ANXIETY, Disp: 90 capsule, Rfl: 0   fluticasone (FLONASE) 50 MCG/ACT nasal spray, Place 2 sprays into both nostrils daily., Disp: 16 g, Rfl: 6   gabapentin (NEURONTIN) 300 MG capsule, Take 1-2 capsules by mouth at bedtime as needed., Disp: 180 capsule, Rfl: 3   HYDROcodone-acetaminophen (NORCO/VICODIN) 5-325 MG tablet, Take one tablet by mouth 8 hours as needed for chronic back pain. (10/11/21), Disp: 90 tablet, Rfl: 0   HYDROcodone-acetaminophen (NORCO/VICODIN) 5-325 MG tablet, Take one tablet by mouth every 8 hours as needed for chronic back pain.(09/12/21), Disp: 90 tablet, Rfl: 0   HYDROcodone-acetaminophen (NORCO/VICODIN) 5-325 MG tablet, Take one tablet by mouth every 8 hours as needed for chronic back pain., Disp: 90 tablet, Rfl: 0   olopatadine (PATANOL) 0.1 % ophthalmic solution, Place 1 drop into both eyes 2 (  two) times daily., Disp: 5 mL, Rfl: 12   omeprazole (PRILOSEC) 20 MG capsule, Take 1 capsule (20 mg total) by mouth daily., Disp: 30 capsule, Rfl: 2   predniSONE (STERAPRED UNI-PAK 21 TAB) 10 MG (21) TBPK tablet, Use as directed, Disp: 21 tablet, Rfl: 0   pregabalin (LYRICA) 75 MG capsule, TAKE 1 CAPSULE BY MOUTH 2 TIMES DAILY, Disp: 60 capsule, Rfl: 2   promethazine-dextromethorphan (PROMETHAZINE-DM) 6.25-15  MG/5ML syrup, Take 5 mLs by mouth 3 (three) times daily as needed for cough., Disp: 118 mL, Rfl: 0   rizatriptan (MAXALT) 5 MG tablet, Take 1 tablet (5 mg total) by mouth as needed for migraine. May repeat in 2 hours if needed, Disp: 10 tablet, Rfl: 2   rosuvastatin (CRESTOR) 10 MG tablet, Take 1 tablet (10 mg total) by mouth daily., Disp: 90 tablet, Rfl: 1   solifenacin (VESICARE) 10 MG tablet, Take 1 tablet (10 mg total) by mouth daily. At bedtime, Disp: 30 tablet, Rfl: 11   topiramate (TOPAMAX) 50 MG tablet, Take 1 tablet (50 mg total) by mouth at bedtime., Disp: 90 tablet, Rfl: 3  Allergies  Allergen Reactions   Lamictal [Lamotrigine]     Knots in throat and back of head   Past Medical History:  Diagnosis Date   Back pain    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, nerve impingment    Bipolar 1 disorder, mixed ([place])    Depression    Hair pulling    HSIL on Pap smear of cervix 2013   [place]   Migraines     Observations/Objective: A&O  No respiratory distress or wheezing audible over the phone Mood, judgement, and thought processes all WNL   Assessment and Plan: 1. Migraine with aura and without status migrainosus, not intractable Well controlled on current regimen.  - topiramate (TOPAMAX) 50 MG tablet; Take 1 tablet (50 mg total) by mouth at bedtime.  Dispense: 90 tablet; Refill: 1 - CBC with Differential/Platelet - CMP14+EGFR  2. Mixed hyperlipidemia - CBC with Differential/Platelet - CMP14+EGFR - Lipid panel  3. History of COVID-19 - benzonatate (TESSALON) 200 MG capsule; Take 1 capsule (200 mg total) by mouth 3 (three) times daily as needed.  Dispense: 30 capsule; Refill: 1  4. Cough - benzonatate (TESSALON) 200 MG capsule; Take 1 capsule (200 mg total) by mouth 3 (three) times daily as needed.  Dispense: 30 capsule; Refill: 1  5. Encounter for hepatitis C screening test for low risk patient - Hepatitis C antibody (reflex, frozen specimen) -  Interpretation:  6. Atypical squamous cells of undetermined significance on cytologic smear of cervix (ASC-US) Strongly encouraged patient to schedule repeat pap smear since her last one was almost two years ago was abnormal with the recommendation to repeat in one year.    Follow Up Instructions: Return in about 6 months (around 04/30/2022) for annual physical.  I discussed the assessment and treatment plan with the patient. The patient was provided an opportunity to ask questions and all were answered. The patient agreed with the plan and demonstrated an understanding of the instructions.   The patient was advised to call back or seek an in-person evaluation if the symptoms worsen or if the condition fails to improve as anticipated.  The above assessment and management plan was discussed with the patient. The patient verbalized understanding of and has agreed to the management plan. Patient is aware to call the clinic if symptoms persist or worsen. Patient is aware when to return  to the clinic for a follow-up visit. Patient educated on when it is appropriate to go to the emergency department.   Time call ended: 3:28 PM  I provided 11 minutes of non-face-to-face time during this encounter.  Hendricks Limes, MSN, APRN, FNP-C [facility] 10/31/21

## 2021-11-01 ENCOUNTER — Encounter: Payer: Self-pay | Admitting: Family Medicine

## 2021-11-01 DIAGNOSIS — R7989 Other specified abnormal findings of blood chemistry: Secondary | ICD-10-CM

## 2021-11-01 LAB — CMP14+EGFR
ALT: 64 IU/L — ABNORMAL HIGH (ref 0–32)
AST: 53 IU/L — ABNORMAL HIGH (ref 0–40)
Albumin/Globulin Ratio: 1.4 (ref 1.2–2.2)
Albumin: 4.2 g/dL (ref 3.8–4.8)
Alkaline Phosphatase: 155 IU/L — ABNORMAL HIGH (ref 44–121)
BUN/Creatinine Ratio: 15 (ref 9–23)
BUN: 12 mg/dL (ref 6–20)
Bilirubin Total: <0.2 mg/dL (ref 0.0–1.2)
CO2: 23 mmol/L (ref 20–29)
Calcium: 9.1 mg/dL (ref 8.7–10.2)
Chloride: 103 mmol/L (ref 96–106)
Creatinine, Ser: 0.79 mg/dL (ref 0.57–1.00)
Globulin, Total: 3 g/dL (ref 1.5–4.5)
Glucose: 84 mg/dL (ref 70–99)
Potassium: 4.5 mmol/L (ref 3.5–5.2)
Sodium: 138 mmol/L (ref 134–144)
Total Protein: 7.2 g/dL (ref 6.0–8.5)
eGFR: 98 mL/min/{1.73_m2} (ref >59)

## 2021-11-01 LAB — LIPID PANEL
Chol/HDL Ratio: 4.5 ratio — ABNORMAL HIGH (ref 0.0–4.4)
Cholesterol, Total: 222 mg/dL — ABNORMAL HIGH (ref 100–199)
HDL: 49 mg/dL (ref >39)
LDL Chol Calc (NIH): 136 mg/dL — ABNORMAL HIGH (ref 0–99)
Triglycerides: 207 mg/dL — ABNORMAL HIGH (ref 0–149)
VLDL Cholesterol Cal: 37 mg/dL (ref 5–40)

## 2021-11-01 LAB — CBC WITH DIFFERENTIAL/PLATELET
Basophils Absolute: 0 10*3/uL (ref 0.0–0.2)
Basos: 1 %
EOS (ABSOLUTE): 0.2 10*3/uL (ref 0.0–0.4)
Eos: 3 %
Hematocrit: 40.4 % (ref 34.0–46.6)
Hemoglobin: 13.6 g/dL (ref 11.1–15.9)
Immature Grans (Abs): 0 10*3/uL (ref 0.0–0.1)
Immature Granulocytes: 1 %
Lymphocytes Absolute: 2 10*3/uL (ref 0.7–3.1)
Lymphs: 33 %
MCH: 27 pg (ref 26.6–33.0)
MCHC: 33.7 g/dL (ref 31.5–35.7)
MCV: 80 fL (ref 79–97)
Monocytes Absolute: 0.4 10*3/uL (ref 0.1–0.9)
Monocytes: 6 %
Neutrophils Absolute: 3.5 10*3/uL (ref 1.4–7.0)
Neutrophils: 56 %
Platelets: 302 10*3/uL (ref 150–450)
RBC: 5.04 x10E6/uL (ref 3.77–5.28)
RDW: 13.1 % (ref 11.7–15.4)
WBC: 6.2 10*3/uL (ref 3.4–10.8)

## 2021-11-01 LAB — HCV INTERPRETATION

## 2021-11-01 LAB — HCV AB W REFLEX TO QUANT PCR: HCV Ab: <0.1 s/co ratio (ref 0.0–0.9)

## 2021-11-03 ENCOUNTER — Encounter: Payer: Self-pay | Admitting: Family Medicine

## 2021-11-06 ENCOUNTER — Encounter (INDEPENDENT_AMBULATORY_CARE_PROVIDER_SITE_OTHER): Payer: Self-pay | Admitting: *Deleted

## 2021-11-11 ENCOUNTER — Other Ambulatory Visit (HOSPITAL_COMMUNITY): Payer: Self-pay

## 2021-11-11 DIAGNOSIS — M5030 Other cervical disc degeneration, unspecified cervical region: Secondary | ICD-10-CM | POA: Diagnosis not present

## 2021-11-11 DIAGNOSIS — M5416 Radiculopathy, lumbar region: Secondary | ICD-10-CM | POA: Diagnosis not present

## 2021-11-11 DIAGNOSIS — M961 Postlaminectomy syndrome, not elsewhere classified: Secondary | ICD-10-CM | POA: Diagnosis not present

## 2021-11-11 MED ORDER — HYDROCODONE-ACETAMINOPHEN 5-325 MG PO TABS
ORAL_TABLET | ORAL | 0 refills | Status: DC
  Filled 2021-11-11 (×2): qty 90, 30d supply, fill #0
  Filled ????-??-??: fill #0

## 2021-11-11 MED ORDER — HYDROCODONE-ACETAMINOPHEN 5-325 MG PO TABS: ORAL_TABLET | ORAL | 0 refills | Status: DC

## 2021-11-18 ENCOUNTER — Other Ambulatory Visit (HOSPITAL_COMMUNITY): Payer: Self-pay

## 2021-11-18 DIAGNOSIS — F429 Obsessive-compulsive disorder, unspecified: Secondary | ICD-10-CM | POA: Diagnosis not present

## 2021-11-18 DIAGNOSIS — F331 Major depressive disorder, recurrent, moderate: Secondary | ICD-10-CM | POA: Diagnosis not present

## 2021-11-18 DIAGNOSIS — F902 Attention-deficit hyperactivity disorder, combined type: Secondary | ICD-10-CM | POA: Diagnosis not present

## 2021-11-18 DIAGNOSIS — F411 Generalized anxiety disorder: Secondary | ICD-10-CM | POA: Diagnosis not present

## 2021-11-18 MED ORDER — ARIPIPRAZOLE 2 MG PO TABS
ORAL_TABLET | ORAL | 0 refills | Status: DC
  Filled 2021-11-18: qty 90, 90d supply, fill #0

## 2021-11-18 MED ORDER — BUPROPION HCL ER (XL) 150 MG PO TB24
ORAL_TABLET | ORAL | 0 refills | Status: DC
  Filled 2021-11-18: qty 90, 90d supply, fill #0

## 2021-11-18 MED ORDER — FLUOXETINE HCL 40 MG PO CAPS
ORAL_CAPSULE | ORAL | 0 refills | Status: DC
  Filled 2021-11-18: qty 90, 90d supply, fill #0

## 2021-11-20 ENCOUNTER — Other Ambulatory Visit (HOSPITAL_COMMUNITY): Payer: Self-pay

## 2021-11-20 MED ORDER — AMPHETAMINE-DEXTROAMPHETAMINE 20 MG PO TABS
ORAL_TABLET | ORAL | 0 refills | Status: DC
  Filled 2021-11-20: qty 45, 30d supply, fill #0

## 2021-11-21 ENCOUNTER — Other Ambulatory Visit (HOSPITAL_COMMUNITY): Payer: Self-pay

## 2021-11-26 ENCOUNTER — Encounter (INDEPENDENT_AMBULATORY_CARE_PROVIDER_SITE_OTHER): Payer: Self-pay | Admitting: Gastroenterology

## 2021-11-26 ENCOUNTER — Ambulatory Visit (INDEPENDENT_AMBULATORY_CARE_PROVIDER_SITE_OTHER): Payer: 59 | Admitting: Gastroenterology

## 2021-11-26 ENCOUNTER — Other Ambulatory Visit: Payer: Self-pay

## 2021-11-26 VITALS — BP 109/73 | HR 115 | Temp 98.5°F | Ht 64.5 in | Wt 173.3 lb

## 2021-11-26 DIAGNOSIS — R110 Nausea: Secondary | ICD-10-CM

## 2021-11-26 DIAGNOSIS — R7989 Other specified abnormal findings of blood chemistry: Secondary | ICD-10-CM

## 2021-11-26 NOTE — Patient Instructions (Addendum)
Please let me know if you start to have more nausea again, at this time we will hold off on any endoscopic procedures but if symptoms return, we may need to discuss further evaluation with EGD. I suspect you had some gastritis causing your symptoms. You can take your omeprazole as needed if this works well for you, sometimes even taking it every other day or every 2 days can help to keep symptoms at [place].  As far as your liver enzymes, I would like to do some further investigation with an [place] and labs to rule out infectious/autoimmune causes of these elevations as they have been trending up over the past few years. I will be in touch regarding these results.

## 2021-11-26 NOTE — Progress Notes (Signed)
Referring Provider: Loman Brooklyn, FNP Primary Care Physician:  Loman Brooklyn, FNP Primary GI Physician: New  Chief Complaint  Patient presents with   Elevated Liver functions.    Patient states she had some issues in August of 2022 for a month with nausea. She has had some bouts with nausea lately as well. States she does run a low grade temp in the afternoons and feels sluggish. Denies any other current GI issues.   HPI:   [name] is a 41 y.o. female with past medical history of bipolar 1, depression, migraines.   Patient presenting today as a new patient for nausea.  Patient reports that for almost the whole month of august she had nausea, with dry heaves. She states that she never actually vomited. She felt at first that she might be coming down with a virus. She saw PCP who gave her Zofran and did H pylori breath test that was negative, she was also started on omeprazole 71m daily. She was not able to pinpoint anything that precipitated her symptoms. Eating did not seem to make the nausea worse. Nothing seemed to make nausea better other than zofran. She denies any episodes of rectal bleeding, melena, early satiety, changes in appetite, weight loss, diarrhea or constipation. She reports that she has since started taking omeprazole 267monly as needed, if she eats something spicy, maybe 1-2x per week. She states that after august symptoms mostly resolved with very infrequent episodes of nausea since then. Patient is s/p cholecystectomy.   She reports that she also runs a low grade fever in the evenings, as high as 100, this has been going on for the past year.  Notably, patient has some elevation in her liver enzymes, she states that these have been somewhat elevated since she had Mono in 2009. She reports that she had previously pos Hep C but on retest this was negative and thought that original was false positive. Last LFTs jan 2023 with ALk phos 155, AST 53, ALT 64.  No  red flag symptoms. Patient denies melena, hematochezia, nausea, vomiting, diarrhea, constipation, dysphagia, odyonophagia, early satiety or weight loss.   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, nerve impingment    [place] [place]   Depression    Hair pulling    HSIL on Pap smear of cervix 2013   [facility] Migraines     Past Surgical History:  Procedure Laterality Date   APPENDECTOMY  2019   CHOLECYSTECTOMY     LAPAROSCOPIC APPENDECTOMY N/A 05/03/2018   Procedure: APPENDECTOMY LAPAROSCOPIC;  Surgeon: WiGreer PickerelMD;  [facility] Service: General;  Laterality: N/A;   TUBAL LIGATION      Current Outpatient Medications  Medication Sig Dispense Refill   amphetamine-dextroamphetamine (ADDERALL) 20 MG tablet Take 1 tablet(s) by mouth every morning and 1/2 tablet daily after lunch for ADHD 45 tablet 0   buPROPion (WELLBUTRIN XL) 150 MG 24 hr tablet Take 1 tablet(s) by mouth daily for depression 30 tablet 2   FLUoxetine (PROZAC) 40 MG capsule Take 1 capsule(s) by mouth daily for depression/anxiety 30 capsule 2   fluticasone (FLONASE) 50 MCG/ACT nasal spray Place 2 sprays into both nostrils daily. 16 g 6   gabapentin (NEURONTIN) 300  MG capsule Take 1-2 capsules by mouth at bedtime as needed. 180 capsule 3   HYDROcodone-acetaminophen (NORCO/VICODIN) 5-325 MG tablet Take one tablet by mouth every 8 hours as needed for chronic back pain.(09/12/21) 90 tablet 0   olopatadine (PATANOL) 0.1 % ophthalmic solution Place 1 drop into both eyes 2 (two) times daily. 5 mL 12   omeprazole (PRILOSEC) 20 MG capsule Take 1 capsule (20 mg total) by mouth daily. 30 capsule 2   rizatriptan (MAXALT) 5 MG tablet Take 1 tablet (5 mg total) by mouth as needed for  migraine. May repeat in 2 hours if needed 10 tablet 2   topiramate (TOPAMAX) 50 MG tablet Take 1 tablet (50 mg total) by mouth at bedtime. 90 tablet 1   ARIPiprazole (ABILIFY) 2 MG tablet TAKE 1 TABLET BY MOUTH AT BEDTIME FOR OCD (Patient not taking: Reported on 11/26/2021) 30 tablet 2   fenofibrate (TRICOR) 48 MG tablet Take 1 tablet (48 mg total) by mouth daily. (Patient not taking: Reported on 11/26/2021) 90 tablet 1   rosuvastatin (CRESTOR) 10 MG tablet Take 1 tablet (10 mg total) by mouth daily. (Patient not taking: Reported on 11/26/2021) 90 tablet 1   No current facility-administered medications for this visit.    Allergies as of 11/26/2021 - Review Complete 11/26/2021  Allergen Reaction Noted   Lamictal [lamotrigine]  06/02/2014    Family History  Problem Relation Age of Onset   Heart disease Mother    COPD Mother    Lung cancer Mother    Alcohol abuse Father    Bipolar disorder Father    Prostate cancer Father    Melanoma Paternal Grandmother     Social History   Socioeconomic History   Marital status: Married    Spouse name: Not on file   Number of children: 3   Years of education: Not on file   Highest education level: Not on file  Occupational History   Not on file  Tobacco Use   Smoking status: Some Days    Packs/day: 0.50    Types: Cigarettes    Last attempt to quit: 06/08/2019    Years since quitting: 2.4   Smokeless tobacco: Never  Vaping Use   Vaping Use: Some days  Substance and Sexual Activity   Alcohol use: Yes    Alcohol/week: 0.0 standard drinks    Comment: occ   Drug use: No   Sexual activity: Yes    Birth control/protection: Surgical    Comment: tubal  Other Topics Concern   Not on file  Social History Narrative   Not on file   Social Determinants of Health   Financial Resource Strain: Low Risk    Difficulty of Paying Living Expenses: Not hard at all  Food Insecurity: No Food Insecurity   Worried About Charity fundraiser in the Last  Year: Never true   [place] in the Last Year: Never true  Transportation Needs: No Transportation Needs   Lack of Transportation (Medical): No   Lack of Transportation (Non-Medical): No  Physical Activity: Sufficiently Active   Days of Exercise per Week: 5 days   Minutes of Exercise per Session: 30 min  Stress: No Stress Concern Present   Feeling of Stress : Only a little  Social Connections: Moderately Integrated   Frequency of Communication with Friends and Family: More than three times a week   Frequency of Social Gatherings with Friends and Family: Once a week   Attends Religious Services:  1 to 4 times per year   Active Member of Clubs or Organizations: No   Attends Archivist Meetings: Never   Marital Status: Married   Review of systems General: negative for malaise, night sweats, fever, chills, weight loss Neck: Negative for lumps, goiter, pain and significant neck swelling Resp: Negative for cough, wheezing, dyspnea at rest CV: Negative for chest pain, leg swelling, palpitations, orthopnea GI: denies melena, hematochezia, nausea, vomiting, diarrhea, constipation, dysphagia, odyonophagia, early satiety or unintentional weight loss.  MSK: Negative for joint pain or swelling, back pain, and muscle pain. Derm: Negative for itching or rash Psych: Denies depression, anxiety, memory loss, confusion. No homicidal or suicidal ideation.  Heme: Negative for prolonged bleeding, bruising easily, and swollen nodes. Endocrine: Negative for cold or heat intolerance, polyuria, polydipsia and goiter. Neuro: negative for tremor, gait imbalance, syncope and seizures. The remainder of the review of systems is noncontributory.  Physical Exam: BP 109/73 (BP Location: Left Arm, Patient Position: Standing, Cuff Size: Large)    Pulse (!) 115    Temp 98.5 F (36.9 C) (Oral)    Ht 5' 4.5" (1.638 m)    Wt 173 lb 4.8 oz (78.6 kg)    BMI 29.29 kg/m  General:   Alert and oriented. No  distress noted. Pleasant and cooperative.  Head:  Normocephalic and atraumatic. Eyes:  Conjuctiva clear without scleral icterus. Mouth:  Oral mucosa pink and moist. Good dentition. No lesions. Heart: Normal rate and rhythm, s1 and s2 heart sounds present.  Lungs: Clear lung sounds in all lobes. Respirations equal and unlabored. Abdomen:  +BS, soft, non-tender and non-distended. No rebound or guarding. No HSM or masses noted. Derm: No palmar erythema or jaundice Msk:  Symmetrical without gross deformities. Normal posture. Extremities:  Without edema. Neurologic:  Alert and  oriented x4 Psych:  Alert and cooperative. Normal mood and affect.  Invalid input(s): 6 MONTHS   ASSESSMENT: LELANIA BIA is a 41 y.o. female presenting today for nausea.  Acute onset of nausea that lasted for about 1 month and has since mostly resolved since being put on PPI which is now only taking PRN. Suspect symptoms could have been related to gastritis as H pylori was negative and symptoms improved with PPI. We will hold off on further evaluation at this time, she will let me know if she begins to have frequent nausea again. I advised her to try taking PPI every other day to every 2 days since she does not feel that she needs it daily.  Patient also noted to have ongoing elevated LFTs AST 53, ALT 64 with a recent rise in Alk phos (155). Recent Hep C Ab testing in January was negative. LFTs appear to have been trending up over the past few years, per patient they have been somewhat elevated since 2009 when she had mono though no dedicated liver imaging or AIH serologies were done to her knowledge. She is not currently taking her statin, she denies regular alcohol use and does not take high doses of acetaminophen, no hx of IVDU. We will proceed with RUQ [place] as well as acute hepatitis and AIH serologies for further evaluation of this.  All questions were answered, patient verbalized understanding and is in agreement with  plan as outline above.    PLAN:  RUQ [place] 2. Acute Hepatitis Panel, A1A, ANA, ASMA, AMA, IgG, IgA, IgM 3. Continue omeprazole 57m as needed, every other day or every 2 days 4. Patient to let me know  if frequent nausea reoccurs    Follow Up: TBD  Angel Qin L. Alver Sorrow, MSN, APRN, AGNP-C Adult-Gerontology Nurse Practitioner [facility]

## 2021-11-29 LAB — HEPATITIS PANEL, ACUTE
Hep A IgM: NONREACTIVE
Hep B C IgM: NONREACTIVE
Hepatitis B Surface Ag: NONREACTIVE
Hepatitis C Ab: NONREACTIVE
SIGNAL TO CUT-OFF: 0.04 (ref <1.00)

## 2021-11-29 LAB — IGG, IGA, IGM
IgG (Immunoglobin G), Serum: 1518 mg/dL (ref 600–1640)
IgM, Serum: 219 mg/dL (ref 50–300)
Immunoglobulin A: 334 mg/dL — ABNORMAL HIGH (ref 47–310)

## 2021-11-29 LAB — ANTI-SMOOTH MUSCLE ANTIBODY, IGG: Actin (Smooth Muscle) Antibody (IGG): <20 U (ref <20)

## 2021-11-29 LAB — ANA: Anti Nuclear Antibody (ANA): NEGATIVE

## 2021-11-29 LAB — CERULOPLASMIN: Ceruloplasmin: 27 mg/dL (ref 18–53)

## 2021-11-29 LAB — ALPHA-1-ANTITRYPSIN: A-1 Antitrypsin, Ser: 95 mg/dL (ref 83–199)

## 2021-11-29 LAB — MITOCHONDRIAL ANTIBODIES: Mitochondrial M2 Ab, IgG: <=20 U (ref <=20.0)

## 2021-12-02 ENCOUNTER — Encounter (INDEPENDENT_AMBULATORY_CARE_PROVIDER_SITE_OTHER): Payer: Self-pay

## 2021-12-04 ENCOUNTER — Other Ambulatory Visit: Payer: Self-pay

## 2021-12-04 ENCOUNTER — Ambulatory Visit (HOSPITAL_COMMUNITY)
Admission: RE | Admit: 2021-12-04 | Discharge: 2021-12-04 | Disposition: A | Discharge status: Home / Self Care | Payer: 59 | Source: Ambulatory Visit | Attending: Gastroenterology | Admitting: Gastroenterology

## 2021-12-04 DIAGNOSIS — R945 Abnormal results of liver function studies: Secondary | ICD-10-CM | POA: Diagnosis not present

## 2021-12-04 DIAGNOSIS — R7989 Other specified abnormal findings of blood chemistry: Secondary | ICD-10-CM | POA: Insufficient documentation

## 2021-12-04 DIAGNOSIS — Z9049 Acquired absence of other specified parts of digestive tract: Secondary | ICD-10-CM | POA: Diagnosis not present

## 2021-12-11 ENCOUNTER — Encounter: Payer: Self-pay | Admitting: Obstetrics & Gynecology

## 2021-12-11 ENCOUNTER — Other Ambulatory Visit: Payer: Self-pay

## 2021-12-11 ENCOUNTER — Other Ambulatory Visit (HOSPITAL_COMMUNITY): Payer: Self-pay

## 2021-12-11 ENCOUNTER — Other Ambulatory Visit (HOSPITAL_COMMUNITY)
Admission: RE | Admit: 2021-12-11 | Discharge: 2021-12-11 | Disposition: A | Discharge status: Home / Self Care | Payer: 59 | Source: Ambulatory Visit | Attending: Obstetrics & Gynecology | Admitting: Obstetrics & Gynecology

## 2021-12-11 ENCOUNTER — Ambulatory Visit (INDEPENDENT_AMBULATORY_CARE_PROVIDER_SITE_OTHER): Payer: 59 | Admitting: Obstetrics & Gynecology

## 2021-12-11 ENCOUNTER — Other Ambulatory Visit: Payer: Self-pay | Admitting: Obstetrics & Gynecology

## 2021-12-11 VITALS — BP 111/80 | HR 113 | Ht 64.0 in | Wt 173.2 lb

## 2021-12-11 DIAGNOSIS — Z1231 Encounter for screening mammogram for malignant neoplasm of breast: Secondary | ICD-10-CM | POA: Diagnosis not present

## 2021-12-11 DIAGNOSIS — Z789 Other specified health status: Secondary | ICD-10-CM | POA: Diagnosis not present

## 2021-12-11 DIAGNOSIS — Z01419 Encounter for gynecological examination (general) (routine) without abnormal findings: Secondary | ICD-10-CM

## 2021-12-11 DIAGNOSIS — N3946 Mixed incontinence: Secondary | ICD-10-CM | POA: Diagnosis not present

## 2021-12-11 DIAGNOSIS — K7581 Nonalcoholic steatohepatitis (NASH): Secondary | ICD-10-CM | POA: Diagnosis not present

## 2021-12-11 MED ORDER — SOLIFENACIN SUCCINATE 10 MG PO TABS
10.0000 mg | ORAL_TABLET | Freq: Every day | ORAL | 4 refills | Status: DC
  Filled 2021-12-11 – 2021-12-20 (×2): qty 90, 90d supply, fill #0

## 2021-12-11 NOTE — Progress Notes (Signed)
WELL-WOMAN EXAMINATION Patient name: [name] MRN [id number]  Date of birth: [birth year] Chief Complaint:   Gynecologic Exam  History of Present Illness:   [name] is a 41 y.o. G44P3003 female being seen today for a routine well-woman exam.  She also noted the following concerns:  -Urinary issues: Struggles with urinary urgency and leakage.  Denies urge incontinence.  Notes leaking when she coughs/sneezes both with full and s/p void.  Tried vesicare, not sure if it really made a difference.  Myrbetriq was too expensive.   -Weight management: Recently diagnosed with NASH and elevated cholesterol.  Working on diet/weight loss, but struggling to see improvements.  She is interested in Conservation officer, nature.   Patient's last menstrual period was 11/22/2021. Denies issues with her menses The current method of family planning is tubal ligation.    Last pap year ago.  Last mammogram: ordered. Last colonoscopy: n/a  Depression screen [facility] 2/9 12/11/2021 04/05/2021 03/14/2021 03/07/2021 02/10/2020  Decreased Interest 0 0 0 0 0  Down, Depressed, Hopeless 0 0 0 0 0  PHQ - 2 Score 0 0 0 0 0  Altered sleeping 0 0 0 0 -  Tired, decreased energy 0 0 0 0 -  Change in appetite 0 0 0 0 -  Feeling bad or failure about yourself  0 0 0 0 -  Trouble concentrating 0 0 0 0 -  Moving slowly or fidgety/restless 0 0 0 0 -  Suicidal thoughts 0 0 0 0 -  PHQ-9 Score 0 0 0 0 -  Difficult doing work/chores - Not difficult at all Not difficult at all - -    Review of Systems:   Pertinent items are noted in HPI Denies any headaches, blurred vision, fatigue, shortness of breath, chest pain, abdominal pain, bowel movements or intercourse unless otherwise stated above.  Pertinent History Reviewed:  Reviewed past medical,surgical, social and family history.  Reviewed problem list, medications and allergies. Physical Assessment:   Vitals:   12/11/21 0931  BP: 111/80  Pulse: (!) 113  Weight: 173 lb 3.2  oz (78.6 kg)  Height: 5\' 4"  (1.626 m)  Body mass index is 29.73 kg/m.        Physical Examination:   General appearance - well appearing, and in no distress  Mental status - alert, oriented to person, place, and time  Psych:  She has a normal mood and affect  Skin - warm and dry, normal color  Chest - effort normal, all lung fields clear to auscultation bilaterally  Heart - normal rate and regular rhythm  Neck:  midline trachea, no thyromegaly or nodules  Breasts - breasts appear normal, no suspicious masses, no skin or nipple changes or  axillary nodes  Abdomen - soft, nontender, nondistended, no masses or organomegaly  Pelvic - VULVA: normal appearing vulva with no masses, tenderness or lesions  VAGINA: normal appearing vagina with normal color and discharge, no lesions  CERVIX: normal appearing cervix without discharge or lesions, no CMT Pt instructed to cough- hypermobility of urethra noted with small prolapse appreciated.  Good uterine support noted, no rectocele noted  Thin prep pap is done with HR HPV cotesting  UTERUS: uterus is felt to be normal size, shape, consistency and nontender   ADNEXA: No adnexal masses or tenderness noted.  Extremities:  No swelling or varicosities noted  Chaperone:  Balinda Quails, NP student      Assessment & Plan:  1) Well-Woman Exam -pap collected, reviewed guidelines -mammogram  ordered  2) Weight management -referral created to Weight management in [place]  3) Mixed incontinence, Urethral hypermobility -discussed conservative therapy including decreased caffeine, regular voids and weight loss -mentioned pelvic floor therapy- declined -discussed plan to restart medication- Rx sent in -also briefly discussed surgical options such as sling and referral to urogyn   Orders Placed This Encounter  Procedures   MM 3D SCREEN BREAST BILATERAL    Meds: No orders of the defined types were placed in this encounter.   Follow-up: Return in about 1  year (around 12/11/2022) for Annual.   Janyth Pupa, DO Attending [place], [place] for [facility], [place]

## 2021-12-11 NOTE — Patient Instructions (Signed)
Please schedule a mammogram at one of the following locations:  [place]: [phone]  [facility] [place]:[phone] [address]  

## 2021-12-12 ENCOUNTER — Other Ambulatory Visit (HOSPITAL_COMMUNITY): Payer: Self-pay

## 2021-12-12 MED ORDER — HYDROCODONE-ACETAMINOPHEN 5-325 MG PO TABS
ORAL_TABLET | ORAL | 0 refills | Status: DC
  Filled 2021-12-12: qty 90, 30d supply, fill #0

## 2021-12-13 LAB — CYTOLOGY - PAP
Adequacy: ABSENT
Comment: NEGATIVE
Diagnosis: NEGATIVE
High risk HPV: NEGATIVE

## 2021-12-18 ENCOUNTER — Encounter (INDEPENDENT_AMBULATORY_CARE_PROVIDER_SITE_OTHER): Payer: Self-pay

## 2021-12-20 ENCOUNTER — Other Ambulatory Visit (HOSPITAL_COMMUNITY): Payer: Self-pay

## 2021-12-24 ENCOUNTER — Telehealth (INDEPENDENT_AMBULATORY_CARE_PROVIDER_SITE_OTHER): Payer: Self-pay | Admitting: *Deleted

## 2021-12-24 NOTE — Telephone Encounter (Signed)
Pt called to follow up on mychart message  ?

## 2021-12-25 ENCOUNTER — Encounter (INDEPENDENT_AMBULATORY_CARE_PROVIDER_SITE_OTHER): Payer: Self-pay | Admitting: *Deleted

## 2021-12-25 NOTE — Telephone Encounter (Signed)
Sent response to patient through mychart ?

## 2021-12-30 ENCOUNTER — Ambulatory Visit (INDEPENDENT_AMBULATORY_CARE_PROVIDER_SITE_OTHER): Payer: 59 | Admitting: Gastroenterology

## 2022-01-01 ENCOUNTER — Other Ambulatory Visit (HOSPITAL_COMMUNITY): Payer: Self-pay

## 2022-01-07 ENCOUNTER — Other Ambulatory Visit (HOSPITAL_COMMUNITY): Payer: Self-pay

## 2022-01-08 ENCOUNTER — Other Ambulatory Visit (HOSPITAL_COMMUNITY): Payer: Self-pay

## 2022-01-09 ENCOUNTER — Other Ambulatory Visit (HOSPITAL_COMMUNITY): Payer: Self-pay

## 2022-01-09 MED ORDER — HYDROCODONE-ACETAMINOPHEN 5-325 MG PO TABS
ORAL_TABLET | ORAL | 0 refills | Status: DC
  Filled 2022-01-09: qty 90, 30d supply, fill #0

## 2022-01-10 ENCOUNTER — Telehealth: Payer: 59 | Admitting: Family Medicine

## 2022-01-10 DIAGNOSIS — R519 Headache, unspecified: Secondary | ICD-10-CM

## 2022-01-10 NOTE — Progress Notes (Signed)
Based on what you shared with me, I feel your condition warrants further evaluation and I recommend that you be seen in a face to face visit. ?  ?NOTE: There will be NO CHARGE for this eVisit ?  ?If you are having a true medical emergency please call 911.   ?  ? For an urgent face to face visit, [place] has six urgent care centers for your convenience:  ?  ? [place] [facility] [place] [facility] ?Get Driving Directions ?[phone] ?[address] ?[place], [place] [address] ?  ? [place] [facility]) ?Get Driving Directions ?[phone] ?[address] ?[place], [place] [address] ? ?[place] [facility] [place] ([place]) ?Get Driving Directions ?[place],  [place]  [address] ? ?[place] [facility] ?Get Driving Directions ?[phone] ?[address] ?[place], [place] [address] ?  ?[facility] [place] ?Get Driving Directions  ?[phone] ?[address] ?[address] ?[place], [place] [address] ?  ?[place] [facility] ?Get Driving Directions ?[phone] ?[address] ?[place], [place] [address] ? ?Your MyChart E-visit questionnaire answers were reviewed by a board certified advanced clinical practitioner to complete your personal care plan based on your specific symptoms.  Thank you for using e-Visits. ?   ?I have provided 5 minutes of non face to face time during this encounter for chart review and documentation.  ?

## 2022-01-18 DIAGNOSIS — J302 Other seasonal allergic rhinitis: Secondary | ICD-10-CM | POA: Diagnosis not present

## 2022-01-18 DIAGNOSIS — H6983 Other specified disorders of Eustachian tube, bilateral: Secondary | ICD-10-CM | POA: Diagnosis not present

## 2022-01-21 ENCOUNTER — Other Ambulatory Visit (HOSPITAL_COMMUNITY): Payer: Self-pay

## 2022-01-21 MED ORDER — AMPHETAMINE-DEXTROAMPHETAMINE 20 MG PO TABS
ORAL_TABLET | ORAL | 0 refills | Status: DC
  Filled 2022-02-19: qty 45, 30d supply, fill #0

## 2022-01-21 MED ORDER — AMPHETAMINE-DEXTROAMPHETAMINE 20 MG PO TABS
ORAL_TABLET | ORAL | 0 refills | Status: DC
  Filled 2022-01-21: qty 45, 30d supply, fill #0

## 2022-02-05 ENCOUNTER — Telehealth: Payer: 59 | Admitting: Nurse Practitioner

## 2022-02-05 DIAGNOSIS — J40 Bronchitis, not specified as acute or chronic: Secondary | ICD-10-CM

## 2022-02-05 MED ORDER — BENZONATATE 100 MG PO CAPS: 100.0000 mg | ORAL_CAPSULE | Freq: Three times a day (TID) | ORAL | 0 refills | Status: DC | PRN

## 2022-02-05 MED ORDER — FLUTICASONE PROPIONATE 50 MCG/ACT NA SUSP
2.0000 | Freq: Every day | NASAL | 6 refills | Status: DC
Start: 2022-02-05 — End: 2022-02-14

## 2022-02-05 MED ORDER — ALBUTEROL SULFATE HFA 108 (90 BASE) MCG/ACT IN AERS
2.0000 | INHALATION_SPRAY | Freq: Four times a day (QID) | RESPIRATORY_TRACT | 0 refills | Status: DC | PRN
Start: 2022-02-05 — End: 2022-02-14

## 2022-02-05 MED ORDER — PREDNISONE 20 MG PO TABS: 20.0000 mg | ORAL_TABLET | Freq: Two times a day (BID) | ORAL | 0 refills | Status: AC

## 2022-02-05 NOTE — Progress Notes (Signed)
We are sorry that you are not feeling well.  Here is how we plan to help! ?  ?Based on your presentation I believe you most likely have A cough due to a virus.  This is called viral bronchitis and is best treated by rest, plenty of fluids and control of the cough.  You may use Ibuprofen or Tylenol as directed to help your symptoms.   ?  ?In addition you may use A prescription cough medication called Tessalon Perles '100mg'$ . You may take 1-2 capsules every 8 hours as needed for your cough. ?  ?Prednisone 20 mg twice daily with food for 5 days  ?  ?We will also call in an inhaler to help with your cough, and refill your flonase to help with your post nasal drainage  ?  ?  ?From your responses in the eVisit questionnaire you describe inflammation in the upper respiratory tract which is causing a significant cough.  This is commonly called Bronchitis and has four common causes:   ?Allergies ?Viral Infections ?Acid Reflux ?Bacterial Infection ?Allergies, viruses and acid reflux are treated by controlling symptoms or eliminating the cause. An example might be a cough caused by taking certain blood pressure medications. You stop the cough by changing the medication. Another example might be a cough caused by acid reflux. Controlling the reflux helps control the cough. ?  ?USE OF BRONCHODILATOR ("RESCUE") INHALERS: ?There is a risk from using your bronchodilator too frequently.  The risk is that over-reliance on a medication which only relaxes the muscles surrounding the breathing tubes can reduce the effectiveness of medications prescribed to reduce swelling and congestion of the tubes themselves.  Although you feel brief relief from the bronchodilator inhaler, your asthma may actually be worsening with the tubes becoming more swollen and filled with mucus.  This can delay other crucial treatments, such as oral steroid medications. If you need to use a bronchodilator inhaler daily, several times per day, you should discuss  this with your provider.  There are probably better treatments that could be used to keep your asthma under control.  ?              ?HOME CARE ?Only take medications as instructed by your medical team. ?Complete the entire course of an antibiotic. ?Drink plenty of fluids and get plenty of rest. ?Avoid close contacts especially the very young and the elderly ?Cover your mouth if you cough or cough into your sleeve. ?Always remember to wash your hands ?A steam or ultrasonic humidifier can help congestion.  ?  ?GET HELP RIGHT AWAY IF: ?You develop worsening fever. ?You become short of breath ?You cough up blood. ?Your symptoms persist after you have completed your treatment plan ?MAKE SURE YOU  ?Understand these instructions. ?Will watch your condition. ?Will get help right away if you are not doing well or get worse. ?  ?  ?Thank you for choosing an e-visit. ?  ?Your e-visit answers were reviewed by a board certified advanced clinical practitioner to complete your personal care plan. Depending upon the condition, your plan could have included both over the counter or prescription medications. ?  ?Please review your pharmacy choice. Make sure the pharmacy is open so you can pick up prescription now. If there is a problem, you may contact your provider through [employer] and have the prescription routed to another pharmacy.  Your safety is important to [place]. If you have drug allergies check your prescription carefully.  ?  ?For  the next 24 hours you can use MyChart to ask questions about today's visit, request a non-urgent call back, or ask for a work or school excuse. ?You will get an email in the next two days asking about your experience. I hope that your e-visit has been valuable and will speed your recovery.  ? ?Meds ordered this encounter  ?Medications  ? predniSONE (DELTASONE) 20 MG tablet  ?  Sig: Take 1 tablet (20 mg total) by mouth 2 (two) times daily with a meal for 5 days.  ?  Dispense:  10 tablet  ?   Refill:  0  ? albuterol (VENTOLIN HFA) 108 (90 Base) MCG/ACT inhaler  ?  Sig: Inhale 2 puffs into the lungs every 6 (six) hours as needed for wheezing or shortness of breath.  ?  Dispense:  8 g  ?  Refill:  0  ? fluticasone (FLONASE) 50 MCG/ACT nasal spray  ?  Sig: Place 2 sprays into both nostrils daily.  ?  Dispense:  16 g  ?  Refill:  6  ? benzonatate (TESSALON) 100 MG capsule  ?  Sig: Take 1 capsule (100 mg total) by mouth 3 (three) times daily as needed.  ?  Dispense:  30 capsule  ?  Refill:  0  ?  ? ?I spent approximately 7 minutes reviewing the patient's history, current symptoms and coordinating their plan of care today.   ?

## 2022-02-10 ENCOUNTER — Encounter: Payer: Self-pay | Admitting: Physician Assistant

## 2022-02-10 ENCOUNTER — Ambulatory Visit (INDEPENDENT_AMBULATORY_CARE_PROVIDER_SITE_OTHER): Payer: 59 | Admitting: Physician Assistant

## 2022-02-10 VITALS — BP 116/83 | HR 118 | Ht 64.0 in | Wt 166.0 lb

## 2022-02-10 DIAGNOSIS — R310 Gross hematuria: Secondary | ICD-10-CM

## 2022-02-10 DIAGNOSIS — R3915 Urgency of urination: Secondary | ICD-10-CM | POA: Diagnosis not present

## 2022-02-10 DIAGNOSIS — N3000 Acute cystitis without hematuria: Secondary | ICD-10-CM

## 2022-02-10 LAB — MICROSCOPIC EXAMINATION
Epithelial Cells (non renal): >10 /hpf — AB (ref 0–10)
RBC, Urine: NONE SEEN /hpf (ref 0–2)
Renal Epithel, UA: NONE SEEN /hpf

## 2022-02-10 LAB — URINALYSIS, ROUTINE W REFLEX MICROSCOPIC
Bilirubin, UA: NEGATIVE
Nitrite, UA: POSITIVE — AB
RBC, UA: NEGATIVE
Specific Gravity, UA: >1.03 — ABNORMAL HIGH (ref 1.005–1.030)
Urobilinogen, Ur: 1 mg/dL (ref 0.2–1.0)
pH, UA: 5 (ref 5.0–7.5)

## 2022-02-10 LAB — BLADDER SCAN AMB NON-IMAGING: Scan Result: 0

## 2022-02-10 MED ORDER — PHENAZOPYRIDINE HCL 200 MG PO TABS: 200.0000 mg | ORAL_TABLET | Freq: Three times a day (TID) | ORAL | 0 refills | Status: DC | PRN

## 2022-02-10 MED ORDER — NITROFURANTOIN MONOHYD MACRO 100 MG PO CAPS: 100.0000 mg | ORAL_CAPSULE | Freq: Two times a day (BID) | ORAL | 0 refills | Status: AC

## 2022-02-10 NOTE — Progress Notes (Signed)
post void residual =0mL 

## 2022-02-10 NOTE — Progress Notes (Signed)
? ?02/10/2022 ?11:17 AM  ? ?[name] ?1981/10/04 ?[id number] ? ? ?Assessment: ? ?1. Urgency of urination   ?2. Acute cystitis without hematuria   ?3. Gross hematuria   ? ? ? ?Plan: ?Macrobid and pyridium sent to pt pharmacy and cx ordered. Will adjust therapy if indicated by cx result. ?Today I had a discussion with the patient regarding the findings of gross hematuria including the implications and differential diagnoses associated with it.  I also discussed recommendations for further evaluation including the rationale for upper tract imaging and cystoscopy.  I discussed the nature of these procedures including potential risk and complications.  The patient expressed an understanding of these issues.  Pending findings of hematuria work-up, will readdress her OAB symptoms.  She will remain on Vesicare at this time. ?  ? ?Chief Complaint: Urinary urgency ? ?Referring provider: Loman Brooklyn, FNP ?[address] [place],  [place] [address] ? ? ?History of Present Illness: ? ?[name] is a 41 y.o. year old female who is seen in consultation from Loman Brooklyn, FNP  for evaluation of urinary urgency and dysuria. Pt reports sxs ongoing for 1-2 years and worsening.  She reports multiple infections have been treated over the past 1 to 2 years and all cultures have been negative.  She has tried [employer] as well as [employer] without relief of her symptoms.  She reports no stress urinary incontinence and rarely has incontinence with urgency.  Additionally, the patient admits to intermittent gross hematuria over the past year with history of tobacco use for approximately 15 years in the past.  She was treated for tubo-ovarian abscess 2 years ago and reports continued dyspareunia since.  No history of urinary stone disease. Currently, she denies fever, chills, abdominal pain, constipation, nocturia.  Review of medical records reviewed including labs and imaging during the patient's office visit. ?PVR=0 ?UA=0-5 WBC,  mod bacteria, crystals present, nitrite positive ? ?12/11/21 ?Struggles with urinary urgency and leakage.  Denies urge incontinence.  Notes leaking when she coughs/sneezes both with full and s/p void.  Tried vesicare, not sure if it really made a difference.  Myrbetriq was too expensive.  ? ?Past Medical History:  ?Past Medical History:  ?Diagnosis Date  ? Back pain   ? 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 Bipolar 1 disorder, mixed ([place])   ? Depression   ? Hair pulling   ? HSIL on Pap smear of cervix 2013  ? [place]  ? Migraines   ? ? ?Past Surgical History:  ?Past Surgical History:  ?Procedure Laterality Date  ? APPENDECTOMY  2019  ? CHOLECYSTECTOMY    ? LAPAROSCOPIC APPENDECTOMY N/A 05/03/2018  ? Procedure: APPENDECTOMY LAPAROSCOPIC;  Surgeon: Greer Pickerel, MD;  Location: [facility];  Service: General;  Laterality: N/A;  ? TUBAL LIGATION    ? ? ?Allergies:  ?Allergies  ?Allergen Reactions  ? Lamictal [Lamotrigine]   ?  Knots in throat and back of head  ? ? ?Family History:  ?Family History  ?Problem Relation Age of Onset  ? Heart disease Mother   ? COPD Mother   ? Lung cancer Mother   ? Alcohol abuse Father   ? Bipolar disorder Father   ? Prostate cancer Father   ? Melanoma Paternal Grandmother   ? ? ?Social History:  ?Social History  ? ?Tobacco Use  ? Smoking status: Some Days  ?  Packs/day: 0.50  ?  Types: Cigarettes  ?  Last attempt to quit: 06/08/2019  ?  Years since quitting: 2.6  ? Smokeless tobacco: Never  ?Vaping Use  ? Vaping Use: Some days  ?Substance Use Topics  ? Alcohol use: Yes  ?  Alcohol/week: 0.0 standard drinks  ?  Comment: occ  ? Drug use: No  ? ? ?Review of symptoms:  ?Constitutional:  Negative for unexplained weight loss, night sweats, fever, chills ?ENT:  Negative for nose bleeds, sinus pain, painful swallowing ?CV:  Negative for chest pain, shortness of breath, exercise intolerance, palpitations, loss of consciousness ?Resp:  Negative for cough, wheezing, shortness of  breath ?GI:  Negative for nausea, vomiting, diarrhea, bloody stools ?GU:  Positives noted in HPI; otherwise negativeurinary incontinence ?Neuro:  Negative for seizures, poor balance, limb weakness, slurred speech ?Endocrine:  Negative for polydipsia, polyuria, symptoms of hypoglycemia (dizziness, hunger, sweating) ?Hematologic:  Negative for anemia, purpura, petechia, prolonged or excessive bleeding, use of anticoagulants ? ?Physical Exam: ?BP 116/83   Pulse (!) 118   Ht '5\' 4"'$  (1.626 m)   Wt 166 lb (75.3 kg)   BMI 28.49 kg/m?   ?Constitutional:  Alert and oriented, No acute distress. ?HEENT: NCAT, moist mucus membranes.  Trachea midline, no masses. ?Cardiovascular: Regular rate and rhythm without murmur, rub, or gallops ?No clubbing, cyanosis, or edema. ?Respiratory: Normal respiratory effort, clear to auscultation bilaterally ?GI: Abdomen is soft, nontender, nondistended, no abdominal masses ?BACK:  Non-tender to palpation.  No CVAT ?Skin: No obvious rashes, warm, dry, intact ?Neurologic: Alert and oriented, Cranial nerves grossly intact, no focal deficits, moving all 4 extremities. ?Psychiatric: Appropriate. Normal mood and affect. ? ?Laboratory Data: ?Results for orders placed or performed in visit on 02/10/22 (from the past 24 hour(s))  ?BLADDER SCAN AMB NON-IMAGING  ? Collection Time: 02/10/22 11:14 AM  ?Result Value Ref Range  ? Scan Result 0   ? ? ?Lab Results  ?Component Value Date  ? WBC 6.2 10/31/2021  ? HGB 13.6 10/31/2021  ? HCT 40.4 10/31/2021  ? MCV 80 10/31/2021  ? PLT 302 10/31/2021  ? ? ?Lab Results  ?Component Value Date  ? CREATININE 0.79 10/31/2021  ? ? ?Urinalysis ?   ?Component Value Date/Time  ? [place] YELLOW 01/10/2019 1007  ? APPEARANCEUR CLEAR 01/10/2019 1007  ? LABSPEC 1.026 01/10/2019 1007  ? PHURINE 5.5 01/10/2019 1007  ? GLUCOSEU NEGATIVE 01/10/2019 1007  ? [place] NEGATIVE 01/10/2019 1007  ? [place] NEGATIVE 05/03/2018 1014  ? BILIRUBINUR negative 09/23/2016 1431  ?  BILIRUBINUR negative 12/17/2012 1617  ? [place] NEGATIVE 01/10/2019 1007  ? PROTEINUR NEGATIVE 01/10/2019 1007  ? UROBILINOGEN 1.0 09/23/2016 1431  ? UROBILINOGEN 1.0 12/18/2012 0842  ? NITRITE NEGATIVE 01/10/2019 1007  ? LEUKOCYTESUR NEGATIVE 01/10/2019 1007  ? ? ?Lab Results  ?Component Value Date  ? BACTERIA FEW (A) 12/18/2012  ? ? ?Pertinent Imaging: ?No results found for this or any previous visit. ? ?No results found for this or any previous visit. ? ? ? ? ?[place], Berneice Heinrich, PA-C ?[facility] ?  ?

## 2022-02-12 ENCOUNTER — Other Ambulatory Visit (HOSPITAL_COMMUNITY): Payer: Self-pay

## 2022-02-12 MED ORDER — HYDROCODONE-ACETAMINOPHEN 5-325 MG PO TABS
ORAL_TABLET | ORAL | 0 refills | Status: DC
  Filled 2022-02-12: qty 90, 30d supply, fill #0

## 2022-02-13 LAB — URINE CULTURE

## 2022-02-14 ENCOUNTER — Encounter: Payer: Self-pay | Admitting: Family Medicine

## 2022-02-14 ENCOUNTER — Encounter: Payer: Self-pay | Admitting: Nurse Practitioner

## 2022-02-14 ENCOUNTER — Ambulatory Visit (HOSPITAL_COMMUNITY)
Admission: RE | Admit: 2022-02-14 | Discharge: 2022-02-14 | Disposition: A | Discharge status: Home / Self Care | Payer: 59 | Source: Ambulatory Visit | Attending: Physician Assistant | Admitting: Physician Assistant

## 2022-02-14 ENCOUNTER — Ambulatory Visit: Payer: 59 | Admitting: Nurse Practitioner

## 2022-02-14 VITALS — BP 105/73 | HR 99 | Temp 98.3°F | Resp 16 | Ht 64.0 in | Wt 167.4 lb

## 2022-02-14 DIAGNOSIS — R310 Gross hematuria: Secondary | ICD-10-CM | POA: Diagnosis not present

## 2022-02-14 DIAGNOSIS — M542 Cervicalgia: Secondary | ICD-10-CM

## 2022-02-14 DIAGNOSIS — R319 Hematuria, unspecified: Secondary | ICD-10-CM | POA: Diagnosis not present

## 2022-02-14 MED ORDER — IOHEXOL 300 MG/ML  SOLN
100.0000 mL | Freq: Once | INTRAMUSCULAR | Status: AC | PRN
  Administered 2022-02-14: 100 mL via INTRAVENOUS

## 2022-02-14 MED ORDER — PREDNISONE 10 MG (21) PO TBPK: ORAL_TABLET | ORAL | 0 refills | Status: DC

## 2022-02-14 MED ORDER — METHOCARBAMOL 500 MG PO TABS: 500.0000 mg | ORAL_TABLET | Freq: Four times a day (QID) | ORAL | 1 refills | Status: DC

## 2022-02-14 MED ORDER — METHYLPREDNISOLONE ACETATE 80 MG/ML IJ SUSP
80.0000 mg | Freq: Once | INTRAMUSCULAR | Status: AC
  Administered 2022-02-14: 80 mg via INTRAMUSCULAR

## 2022-02-14 NOTE — Patient Instructions (Signed)
Cervical Radiculopathy  Cervical radiculopathy happens when a nerve in the neck (a cervical nerve) is pinched or bruised. This condition can happen because of an injury to the cervical spine (vertebrae) in the neck, or as part of the normal aging process. Pressure on the cervical nerves can cause pain or numbness that travels from the neck all the way down to the arm and fingers. This condition usually gets better with rest. Treatment may be needed if the condition does not improve. What are the causes? This condition may be caused by: A neck injury. A bulging (herniated) disk. Muscle spasms. Muscle tightness in the neck due to overuse. Arthritis. Breakdown or degeneration in the bones and joints of the spine (spondylosis) due to aging. Bone spurs that may develop near the cervical nerves. What are the signs or symptoms? Symptoms of this condition include: Pain. The pain may travel from the neck to the arm and hand. The pain can be severe or irritating. It may get worse when you move your neck. Numbness or tingling in your arm or hand. Weakness in the affected arm and hand, in severe cases. How is this diagnosed? This condition may be diagnosed based on your symptoms, your medical history, and a physical exam. You may also have tests, including: X-rays. CT scan. MRI. Electromyogram (EMG). Nerve conduction tests. How is this treated? In many cases, treatment is not needed for this condition. With rest, the condition usually gets better over time. If treatment is needed, options may include: Wearing a soft neck collar (cervical collar) for short periods of time. Doing physical therapy to strengthen your neck muscles. Taking medicines. These may include NSAIDs, such as ibuprofen, or oral corticosteroids. Having spinal injections, in severe cases. Having surgery. This may be needed if other treatments do not help. Different types of surgery may be done depending on the cause of this  condition. Follow these instructions at home: If you have a cervical collar: Wear it as told by your health care provider. Remove it only as told by your health care provider. Ask your health care provider if you can remove the cervical collar for cleaning and bathing. If you are allowed to remove the collar for cleaning or bathing: Follow instructions from your health care provider about how to remove the collar safely. Clean the collar by wiping it with mild soap and water and drying it completely. Take out any removable pads in the collar every 1-2 days, and wash them by hand with soap and water. Let them air-dry completely before you put them back in the collar. Check your skin under the collar for irritation or sores. If you see any, tell your health care provider. Managing pain     Take over-the-counter and prescription medicines only as told by your health care provider. If directed, put ice on the affected area. To do this: If you have a soft neck collar, remove it as told by your health care provider. Put ice in a plastic bag. Place a towel between your skin and the bag. Leave the ice on for 20 minutes, 2-3 times a day. Remove the ice if your skin turns bright red. This is very important. If you cannot feel pain, heat, or cold, you have a greater risk of damage to the area. If applying ice does not help, you can try using heat. Use the heat source that your health care provider recommends, such as a moist heat pack or a heating pad. Place a towel between   your skin and the heat source. Leave the heat on for 20-30 minutes. Remove the heat if your skin turns bright red. This is especially important if you are unable to feel pain, heat, or cold. You have a greater risk of getting burned. Try a gentle neck and shoulder massage to help relieve symptoms. Activity Rest as needed. Return to your normal activities as told by your health care provider. Ask your health care provider what  activities are safe for you. Do stretching and strengthening exercises as told by your health care provider or your physical therapist. You may have to avoid lifting. Ask your health care provider how much you can safely lift. General instructions Use a flat pillow when you sleep. Do not drive while wearing a cervical collar. If you do not have a cervical collar, ask your health care provider if it is safe to drive while your neck heals. Ask your health care provider if the medicine prescribed to you requires you to avoid driving or using machinery. Do not use any products that contain nicotine or tobacco. These products include cigarettes, chewing tobacco, and vaping devices, such as e-cigarettes. If you need help quitting, ask your health care provider. Keep all follow-up visits. This is important. Contact a health care provider if: Your condition does not improve with treatment. Get help right away if: Your pain gets much worse and is not controlled with medicines. You have weakness or numbness in your hand, arm, face, or leg. You have a high fever. You have a stiff, rigid neck. You lose control of your bowels or your bladder (have incontinence). You have trouble with walking, balance, or speaking. Summary Cervical radiculopathy happens when a nerve in the neck is pinched or bruised. A nerve can get pinched from a bulging disk, arthritis, muscle spasms, or an injury to the neck. Symptoms include pain, tingling, or numbness radiating from the neck to the arm or hand. Weakness can also occur in severe cases. Treatment may include rest, wearing a cervical collar, and physical therapy. Medicines may be prescribed to help with pain. In severe cases, injections or surgery may be needed. This information is not intended to replace advice given to you by your health care provider. Make sure you discuss any questions you have with your health care provider. Document Revised: 04/11/2021 Document  Reviewed: 04/11/2021 Elsevier Patient Education  2023 Elsevier Inc.  

## 2022-02-14 NOTE — Progress Notes (Signed)
? ?Acute Office Visit ? ?Subjective:  ? ?  ?Patient ID: [name], female    DOB: [birth year], 41 y.o.   MRN: [id number] ? ?Chief Complaint  ?Patient presents with  ? Neck Pain  ? ? ?Neck Pain  ?This is a new problem. The current episode started in the past 7 days. The problem occurs constantly. The problem has been unchanged. The pain is associated with a sleep position. The pain is present in the right side. The quality of the pain is described as aching. The pain is at a severity of 8/10. The pain is severe. The symptoms are aggravated by position. The pain is Same all the time. Stiffness is present All day. Pertinent negatives include no fever, leg pain, numbness or paresis. She has tried NSAIDs for the symptoms. The treatment provided no relief.  ? ? ?Review of Systems  ?Constitutional: Negative.  Negative for chills and fever.  ?HENT: Negative.    ?Respiratory: Negative.    ?Cardiovascular: Negative.   ?Genitourinary: Negative.   ?Musculoskeletal:  Positive for neck pain.  ?Skin:  Negative for rash.  ?Neurological:  Negative for numbness.  ?All other systems reviewed and are negative. ? ? ?   ?Objective:  ?  ?BP 105/73   Pulse 99   Temp 98.3 ?F (36.8 ?C)   Resp 16   Ht '5\' 4"'$  (1.626 m)   Wt 167 lb 6.4 oz (75.9 kg)   SpO2 98%   BMI 28.73 kg/m?  ?BP Readings from Last 3 Encounters:  ?02/14/22 105/73  ?02/10/22 116/83  ?12/11/21 111/80  ? ?Wt Readings from Last 3 Encounters:  ?02/14/22 167 lb 6.4 oz (75.9 kg)  ?02/10/22 166 lb (75.3 kg)  ?12/11/21 173 lb 3.2 oz (78.6 kg)  ? ?  ? ?Physical Exam ?Vitals and nursing note reviewed.  ?Constitutional:   ?   Appearance: Normal appearance.  ?HENT:  ?   Head: Normocephalic.  ?   Right Ear: External ear normal.  ?   Left Ear: External ear normal.  ?   Nose: Nose normal.  ?   Mouth/Throat:  ?   Mouth: Mucous membranes are moist.  ?   Pharynx: Oropharynx is clear.  ?Eyes:  ?   Conjunctiva/sclera: Conjunctivae normal.  ?Cardiovascular:  ?   Rate and Rhythm: Normal  rate and regular rhythm.  ?   Pulses: Normal pulses.  ?   Heart sounds: Normal heart sounds.  ?Pulmonary:  ?   Effort: Pulmonary effort is normal.  ?   Breath sounds: Normal breath sounds.  ?Abdominal:  ?   General: Bowel sounds are normal.  ?Musculoskeletal:  ?   Cervical back: No crepitus. Decreased range of motion.  ?Neurological:  ?   General: No focal deficit present.  ?   Mental Status: She is alert and oriented to person, place, and time.  ?Psychiatric:     ?   Behavior: Behavior normal.  ? ? ?No results found for any visits on 02/14/22. ? ? ?   ?Assessment & Plan:  ?Neck pain not well controlled in the past 1 week. ?Take medication as prescribed ?Tylenol as needed for pain ?Robaxin for muscle pain and spasm ?Depo-Medrol shot given in clinic ?Prednisone taper ?Education provided to patient printed handouts given. ?Follow-up with worsening unresolved symptoms. ?Problem List Items Addressed This Visit   ?None ?Visit Diagnoses   ? ? Neck pain    -  Primary  ? Relevant Medications  ? methocarbamol (ROBAXIN) 500 MG tablet  ?  predniSONE (STERAPRED UNI-PAK 21 TAB) 10 MG (21) TBPK tablet  ? methylPREDNISolone acetate (DEPO-MEDROL) injection 80 mg (Completed)  ? ?  ? ? ?Meds ordered this encounter  ?Medications  ? methocarbamol (ROBAXIN) 500 MG tablet  ?  Sig: Take 1 tablet (500 mg total) by mouth 4 (four) times daily.  ?  Dispense:  30 tablet  ?  Refill:  1  ?  Order Specific Question:   Supervising Provider  ?  AnswerClaretta Fraise [[id number]]  ? predniSONE (STERAPRED UNI-PAK 21 TAB) 10 MG (21) TBPK tablet  ?  Sig: 6 tablets day 1, 5 tablet day 2, 4 tablet day 3, 3 tablet day 4, 2 tablet day 5, 1 tablet day 6  ?  Dispense:  1 each  ?  Refill:  0  ?  Order Specific Question:   Supervising Provider  ?  AnswerClaretta Fraise [[id number]]  ? methylPREDNISolone acetate (DEPO-MEDROL) injection 80 mg  ? ? ?Return if symptoms worsen or fail to improve. ? ?Ivy Lynn, NP ? ? ?

## 2022-02-19 ENCOUNTER — Other Ambulatory Visit: Payer: Self-pay | Admitting: Neurosurgery

## 2022-02-19 ENCOUNTER — Other Ambulatory Visit: Payer: 59 | Admitting: Urology

## 2022-02-19 ENCOUNTER — Other Ambulatory Visit (HOSPITAL_COMMUNITY): Payer: Self-pay | Admitting: Neurosurgery

## 2022-02-19 ENCOUNTER — Other Ambulatory Visit (HOSPITAL_COMMUNITY): Payer: Self-pay

## 2022-02-19 DIAGNOSIS — M961 Postlaminectomy syndrome, not elsewhere classified: Secondary | ICD-10-CM | POA: Diagnosis not present

## 2022-02-19 DIAGNOSIS — M5416 Radiculopathy, lumbar region: Secondary | ICD-10-CM | POA: Diagnosis not present

## 2022-02-19 DIAGNOSIS — F1120 Opioid dependence, uncomplicated: Secondary | ICD-10-CM | POA: Diagnosis not present

## 2022-02-19 DIAGNOSIS — M5030 Other cervical disc degeneration, unspecified cervical region: Secondary | ICD-10-CM | POA: Diagnosis not present

## 2022-02-20 ENCOUNTER — Other Ambulatory Visit (HOSPITAL_COMMUNITY): Payer: Self-pay

## 2022-02-24 ENCOUNTER — Other Ambulatory Visit: Payer: 59 | Admitting: Urology

## 2022-02-24 DIAGNOSIS — F429 Obsessive-compulsive disorder, unspecified: Secondary | ICD-10-CM | POA: Diagnosis not present

## 2022-02-24 DIAGNOSIS — F902 Attention-deficit hyperactivity disorder, combined type: Secondary | ICD-10-CM | POA: Diagnosis not present

## 2022-02-24 DIAGNOSIS — F331 Major depressive disorder, recurrent, moderate: Secondary | ICD-10-CM | POA: Diagnosis not present

## 2022-02-24 DIAGNOSIS — F411 Generalized anxiety disorder: Secondary | ICD-10-CM | POA: Diagnosis not present

## 2022-02-25 ENCOUNTER — Other Ambulatory Visit (HOSPITAL_COMMUNITY): Payer: Self-pay

## 2022-02-25 MED ORDER — BUPROPION HCL ER (XL) 150 MG PO TB24
ORAL_TABLET | ORAL | 0 refills | Status: DC
  Filled 2022-02-25: qty 90, 90d supply, fill #0

## 2022-02-25 MED ORDER — AMPHETAMINE-DEXTROAMPHETAMINE 20 MG PO TABS
ORAL_TABLET | ORAL | 0 refills | Status: DC
  Filled 2022-05-20: qty 45, 30d supply, fill #0

## 2022-02-25 MED ORDER — AMPHETAMINE-DEXTROAMPHETAMINE 20 MG PO TABS
ORAL_TABLET | ORAL | 0 refills | Status: DC
  Filled 2022-03-21: qty 45, 30d supply, fill #0

## 2022-02-25 MED ORDER — FLUOXETINE HCL 40 MG PO CAPS
ORAL_CAPSULE | ORAL | 0 refills | Status: DC
  Filled 2022-02-25: qty 90, 90d supply, fill #0

## 2022-02-25 MED ORDER — REXULTI 0.5 MG PO TABS
ORAL_TABLET | ORAL | 2 refills | Status: DC
Start: 2022-02-25 — End: 2023-01-02
  Filled 2022-02-25: qty 60, 30d supply, fill #0

## 2022-02-25 MED ORDER — ARIPIPRAZOLE 2 MG PO TABS
ORAL_TABLET | ORAL | 0 refills | Status: DC
  Filled 2022-02-25: qty 90, 90d supply, fill #0

## 2022-02-25 MED ORDER — AMPHETAMINE-DEXTROAMPHETAMINE 20 MG PO TABS
ORAL_TABLET | ORAL | 0 refills | Status: DC
  Filled 2022-04-21: qty 45, 30d supply, fill #0

## 2022-02-26 ENCOUNTER — Other Ambulatory Visit (HOSPITAL_COMMUNITY): Payer: Self-pay

## 2022-02-26 MED ORDER — REXULTI 1 MG PO TABS
1.0000 mg | ORAL_TABLET | Freq: Every evening | ORAL | 2 refills | Status: DC
  Filled 2022-02-26: qty 30, 30d supply, fill #0
  Filled 2022-04-10: qty 30, 30d supply, fill #1

## 2022-02-27 ENCOUNTER — Other Ambulatory Visit (HOSPITAL_COMMUNITY): Payer: Self-pay

## 2022-03-01 ENCOUNTER — Other Ambulatory Visit (HOSPITAL_COMMUNITY): Payer: Self-pay

## 2022-03-10 ENCOUNTER — Ambulatory Visit (HOSPITAL_COMMUNITY)
Admission: RE | Admit: 2022-03-10 | Discharge: 2022-03-10 | Disposition: A | Discharge status: Home / Self Care | Payer: 59 | Source: Ambulatory Visit | Attending: Neurosurgery | Admitting: Neurosurgery

## 2022-03-10 DIAGNOSIS — M5030 Other cervical disc degeneration, unspecified cervical region: Secondary | ICD-10-CM | POA: Diagnosis not present

## 2022-03-10 DIAGNOSIS — M542 Cervicalgia: Secondary | ICD-10-CM | POA: Diagnosis not present

## 2022-03-12 ENCOUNTER — Other Ambulatory Visit (HOSPITAL_COMMUNITY): Payer: Self-pay

## 2022-03-12 MED ORDER — HYDROCODONE-ACETAMINOPHEN 5-325 MG PO TABS
ORAL_TABLET | ORAL | 0 refills | Status: DC
  Filled 2022-04-12: qty 30, 30d supply, fill #0

## 2022-03-12 MED ORDER — HYDROCODONE-ACETAMINOPHEN 5-325 MG PO TABS
ORAL_TABLET | ORAL | 0 refills | Status: DC
  Filled 2022-03-12 – 2022-03-13 (×3): qty 60, 30d supply, fill #0

## 2022-03-13 ENCOUNTER — Other Ambulatory Visit (HOSPITAL_COMMUNITY): Payer: Self-pay

## 2022-03-20 ENCOUNTER — Other Ambulatory Visit (HOSPITAL_COMMUNITY): Payer: Self-pay

## 2022-03-21 ENCOUNTER — Other Ambulatory Visit (HOSPITAL_COMMUNITY): Payer: Self-pay

## 2022-03-22 ENCOUNTER — Other Ambulatory Visit (HOSPITAL_COMMUNITY): Payer: Self-pay

## 2022-03-24 ENCOUNTER — Other Ambulatory Visit (HOSPITAL_COMMUNITY): Payer: Self-pay

## 2022-03-27 ENCOUNTER — Ambulatory Visit: Payer: 59

## 2022-04-07 ENCOUNTER — Encounter: Payer: Self-pay | Admitting: Physical Therapy

## 2022-04-07 ENCOUNTER — Encounter: Payer: Self-pay | Admitting: Family Medicine

## 2022-04-07 ENCOUNTER — Ambulatory Visit: Payer: 59 | Admitting: Physical Therapy

## 2022-04-07 ENCOUNTER — Ambulatory Visit (INDEPENDENT_AMBULATORY_CARE_PROVIDER_SITE_OTHER): Payer: 59 | Admitting: Family Medicine

## 2022-04-07 DIAGNOSIS — U071 COVID-19: Secondary | ICD-10-CM | POA: Diagnosis not present

## 2022-04-07 NOTE — Progress Notes (Signed)
   Virtual Visit  Note Due to COVID-19 pandemic this visit was conducted virtually. This visit type was conducted due to national recommendations for restrictions regarding the COVID-19 Pandemic (e.g. social distancing, sheltering in place) in an effort to limit this patient's exposure and mitigate transmission in our community. All issues noted in this document were discussed and addressed.  A physical exam was not performed with this format.  I connected with [name] on 04/07/22 at 1032 by telephone and verified that I am speaking with the correct person using two identifiers. [name] is currently located at home and no one is currently with her during the visit. The provider, Gwenlyn Perking, FNP is located in their office at time of visit.  I discussed the limitations, risks, security and privacy concerns of performing an evaluation and management service by telephone and the availability of in person appointments. I also discussed with the patient that there may be a patient responsible charge related to this service. The patient expressed understanding and agreed to proceed.  CC: Covid 19  History and Present Illness:  HPI [name] had a positive home Covid test this morning. She did upload a picture of this. She was exposed at work. She does reports a sore throat that started early this morning and some postnasal drip. She denies fever, chills, shortness of breath, cough, chest pain, nausea, vomiting or diarrhea. She will call health at work to notify her employer.     ROS As per HPI.   Observations/Objective: Alert and oriented x 3. Able to speak in full sentences without difficulty.    Assessment and Plan: Diagnoses and all orders for this visit:  COVID-19 She does not have indications for antiviral treatment. Discussed symptomatic care and return precautions. Discussed quarantine.     Follow Up Instructions: Return to office for new or worsening symptoms, or if  symptoms persist.    I discussed the assessment and treatment plan with the patient. The patient was provided an opportunity to ask questions and all were answered. The patient agreed with the plan and demonstrated an understanding of the instructions.   The patient was advised to call back or seek an in-person evaluation if the symptoms worsen or if the condition fails to improve as anticipated.  The above assessment and management plan was discussed with the patient. The patient verbalized understanding of and has agreed to the management plan. Patient is aware to call the clinic if symptoms persist or worsen. Patient is aware when to return to the clinic for a follow-up visit. Patient educated on when it is appropriate to go to the emergency department.   Time call ended:  1043  I provided 11 minutes of  non face-to-face time during this encounter.    Gwenlyn Perking, FNP

## 2022-04-10 ENCOUNTER — Other Ambulatory Visit (HOSPITAL_COMMUNITY): Payer: Self-pay

## 2022-04-12 ENCOUNTER — Other Ambulatory Visit (HOSPITAL_COMMUNITY): Payer: Self-pay

## 2022-04-21 ENCOUNTER — Other Ambulatory Visit (HOSPITAL_COMMUNITY): Payer: Self-pay

## 2022-05-08 ENCOUNTER — Other Ambulatory Visit: Payer: Self-pay | Admitting: Obstetrics & Gynecology

## 2022-05-08 DIAGNOSIS — R102 Pelvic and perineal pain: Secondary | ICD-10-CM

## 2022-05-08 NOTE — Progress Notes (Signed)
Order for [place]

## 2022-05-09 ENCOUNTER — Encounter: Payer: Self-pay | Admitting: Family Medicine

## 2022-05-09 ENCOUNTER — Encounter: Payer: Self-pay | Admitting: Nurse Practitioner

## 2022-05-09 ENCOUNTER — Telehealth (INDEPENDENT_AMBULATORY_CARE_PROVIDER_SITE_OTHER): Payer: 59 | Admitting: Nurse Practitioner

## 2022-05-09 DIAGNOSIS — M5416 Radiculopathy, lumbar region: Secondary | ICD-10-CM

## 2022-05-09 MED ORDER — METHOCARBAMOL 750 MG PO TABS: 750.0000 mg | ORAL_TABLET | Freq: Four times a day (QID) | ORAL | 0 refills | Status: DC

## 2022-05-09 MED ORDER — IBUPROFEN 600 MG PO TABS: 600.0000 mg | ORAL_TABLET | Freq: Three times a day (TID) | ORAL | 0 refills | Status: DC | PRN

## 2022-05-09 MED ORDER — PREDNISONE 20 MG PO TABS: 20.0000 mg | ORAL_TABLET | Freq: Every day | ORAL | 0 refills | Status: DC

## 2022-05-09 NOTE — Progress Notes (Signed)
Virtual Visit  Note Due to COVID-19 pandemic this visit was conducted virtually. This visit type was conducted due to national recommendations for restrictions regarding the COVID-19 Pandemic (e.g. social distancing, sheltering in place) in an effort to limit this patient's exposure and mitigate transmission in our community. All issues noted in this document were discussed and addressed.  A physical exam was not performed with this format.  I connected with [name] on 05/09/22 at 4:50 pm  by telephone and verified that I am speaking with the correct person using two identifiers. [name] is currently located at in the car during visit. The provider, Ivy Lynn, NP is located in their office at time of visit.  I discussed the limitations, risks, security and privacy concerns of performing an evaluation and management service by telephone and the availability of in person appointments. I also discussed with the patient that there may be a patient responsible charge related to this service. The patient expressed understanding and agreed to proceed.   History and Present Illness:  Back Pain This is a recurrent problem. The current episode started in the past 7 days. The problem occurs constantly. The problem has been resolved since onset. The pain is present in the gluteal. The pain radiates to the right thigh (Right buttocks). The pain is at a severity of 8/10. The pain is severe. The pain is Worse during the day. The symptoms are aggravated by bending, position and standing. Pertinent negatives include no abdominal pain, bladder incontinence, bowel incontinence, fever, numbness or paresis. She has tried muscle relaxant and ice for the symptoms. The treatment provided mild relief.      Review of Systems  Constitutional:  Negative for fever.  HENT: Negative.    Respiratory: Negative.    Cardiovascular: Negative.   Gastrointestinal:  Negative for abdominal pain and bowel  incontinence.  Genitourinary:  Negative for bladder incontinence.  Musculoskeletal:  Positive for back pain.  Skin: Negative.  Negative for itching and rash.  Neurological:  Negative for numbness.  All other systems reviewed and are negative.    Observations/Objective: Tele-visit patient is not in distress  Assessment and Plan: Patient presents with uncontrolled radiculopathy symptoms present in the past few days.  This is not new for patient.  In the past she has used anti-inflammatory and warm compresses/ice as tolerated with very mild to moderate therapeutic effect. Started patient on Robaxin 500 mg tablet by mouth 4 times daily as needed.  Prednisone 20 mg tablet by mouth for 6 days.  600 mg ibuprofen.  Patient knows to follow-up with unresolved symptoms.  Follow Up Instructions: With worsening unresolved symptom    I discussed the assessment and treatment plan with the patient. The patient was provided an opportunity to ask questions and all were answered. The patient agreed with the plan and demonstrated an understanding of the instructions.   The patient was advised to call back or seek an in-person evaluation if the symptoms worsen or if the condition fails to improve as anticipated.  The above assessment and management plan was discussed with the patient. The patient verbalized understanding of and has agreed to the management plan. Patient is aware to call the clinic if symptoms persist or worsen. Patient is aware when to return to the clinic for a follow-up visit. Patient educated on when it is appropriate to go to the emergency department.   Time call ended:  5:02 pm  I provided 11 minutes of  non  face-to-face time during this encounter.    Ivy Lynn, NP

## 2022-05-12 ENCOUNTER — Ambulatory Visit (HOSPITAL_BASED_OUTPATIENT_CLINIC_OR_DEPARTMENT_OTHER)
Admission: RE | Admit: 2022-05-12 | Discharge: 2022-05-12 | Disposition: A | Discharge status: Home / Self Care | Payer: 59 | Source: Ambulatory Visit | Attending: Obstetrics & Gynecology | Admitting: Obstetrics & Gynecology

## 2022-05-12 DIAGNOSIS — R102 Pelvic and perineal pain: Secondary | ICD-10-CM | POA: Diagnosis not present

## 2022-05-13 ENCOUNTER — Other Ambulatory Visit (HOSPITAL_COMMUNITY): Payer: Self-pay

## 2022-05-13 MED ORDER — GABAPENTIN 300 MG PO CAPS
ORAL_CAPSULE | ORAL | 3 refills | Status: DC
  Filled 2022-05-13: qty 180, 90d supply, fill #0

## 2022-05-20 ENCOUNTER — Other Ambulatory Visit (HOSPITAL_COMMUNITY): Payer: Self-pay

## 2022-05-21 ENCOUNTER — Other Ambulatory Visit (HOSPITAL_COMMUNITY): Payer: Self-pay

## 2022-05-23 IMAGING — MR MR LUMBAR SPINE WO/W CM
4 of 6 series · 30 of 48 positions shown · IV contrast (10 ml Gadavist)
Comparison: Prior MRI from 08/18/2017.

CLINICAL DATA: Initial evaluation for low back pain with radiation
into both lower extremities for 2 months. History of prior surgery.

EXAM:
MRI LUMBAR SPINE WITHOUT AND WITH CONTRAST
TECHNIQUE: Multiplanar and multiecho pulse sequences of the lumbar spine were
obtained without and with intravenous contrast.
CONTRAST:  10mL GADAVIST GADOBUTROL 1 MMOL/ML IV SOLN

[Series 5: T2 · sagittal · 4.0mm · 0.68mm/px · 5 of 13 slices shown (1 of 2)]
[im 1/13]
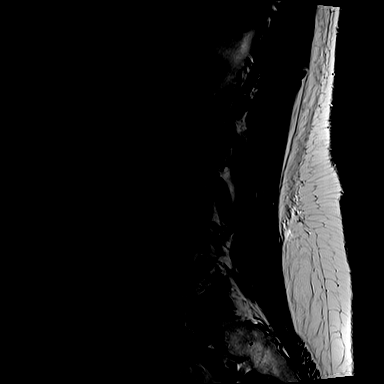
[im 4/13]
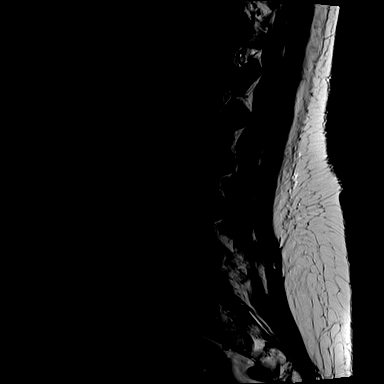
[im 7/13]
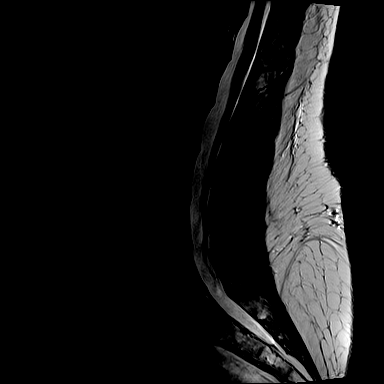
[im 10/13]
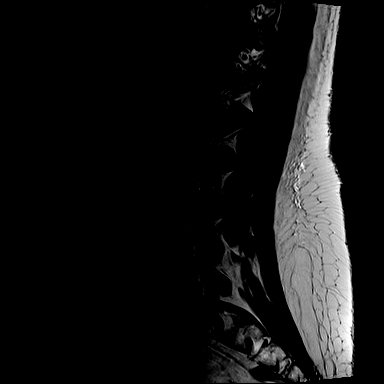
[im 13/13]
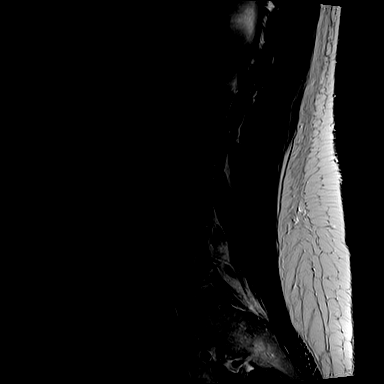

[Series 6: T1 · sagittal · 4.0mm · 0.81mm/px · 5 of 13 slices shown (1 of 2)]
[im 1/13]
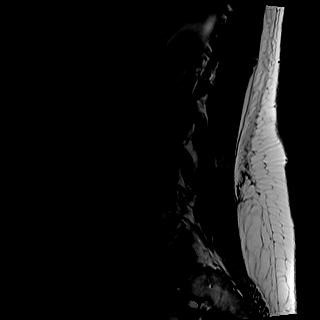
[im 4/13]
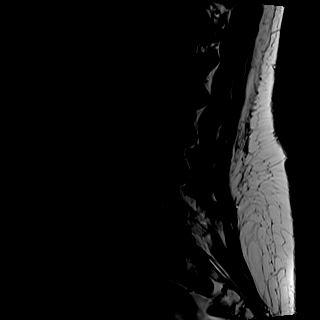
[im 7/13]
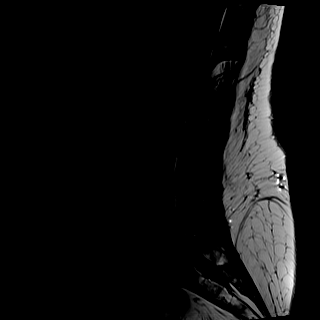
[im 10/13]
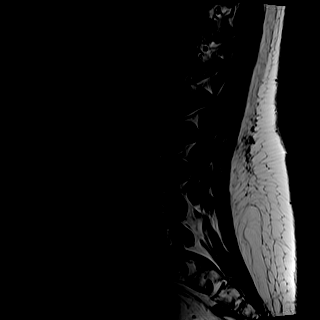
[im 13/13]
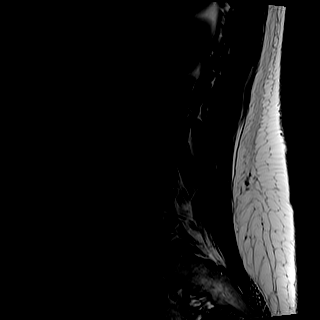

[Series 7: T2 · axial · 4.0mm · 0.70mm/px · z∈[-135,+59]mm · 11 of 33 slices shown (2 of 2)]
[im 1/33]
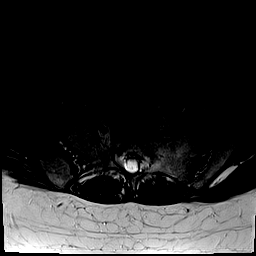
[im 4/33]
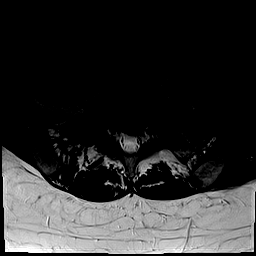
[im 7/33]
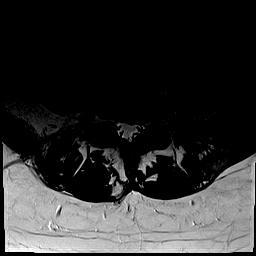
[im 10/33]
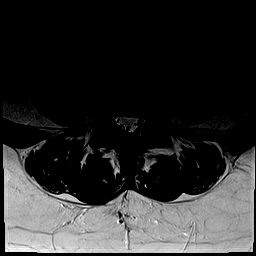
[im 13/33]
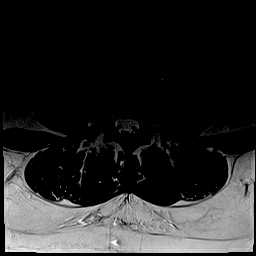
[im 17/33]
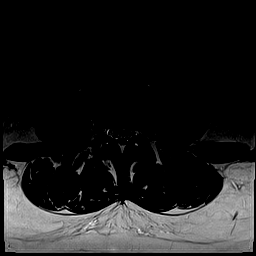
[im 20/33]
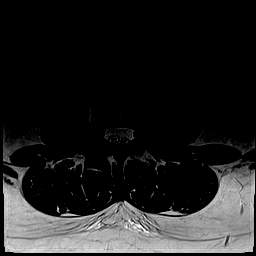
[im 23/33]
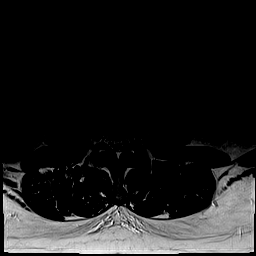
[im 26/33]
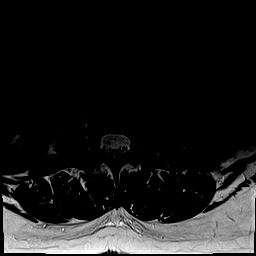
[im 29/33]
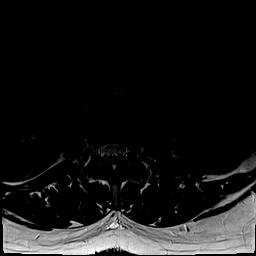
[im 33/33]
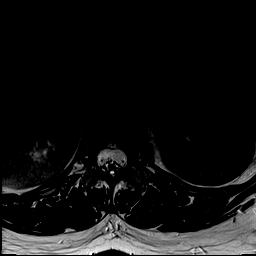

[Series 8: T1 · axial · 4.0mm · 0.35mm/px · z∈[-135,+39]mm · 9 of 33 slices shown (2 of 2)]
[im 1/33]
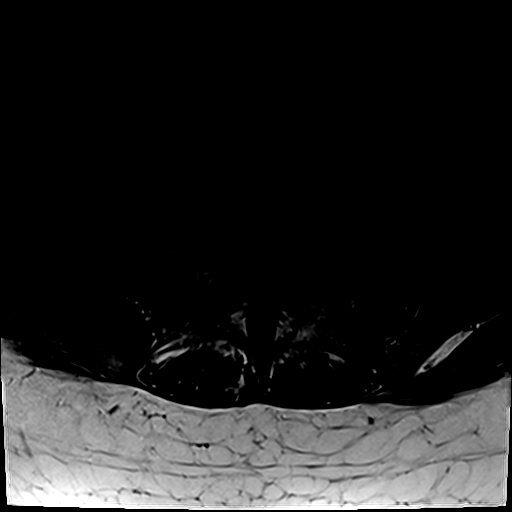
[im 4/33]
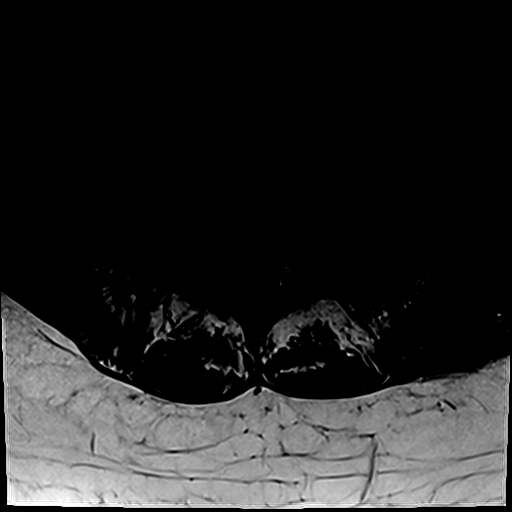
[im 7/33]
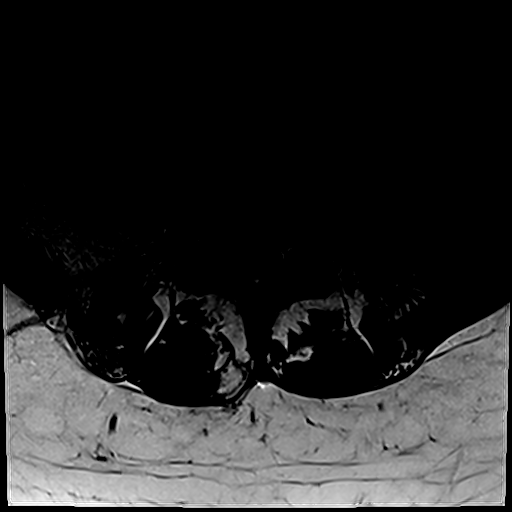
[im 10/33]
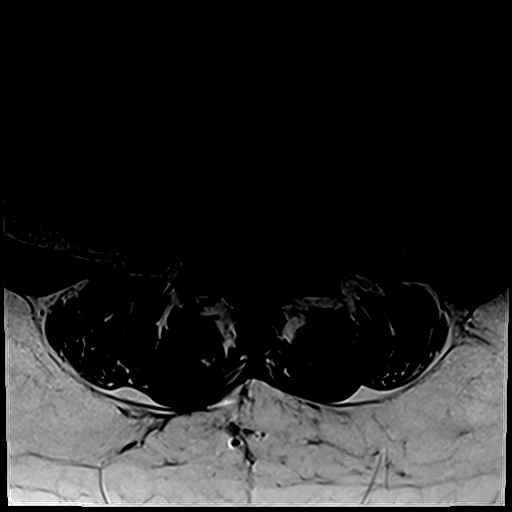
[im 13/33]
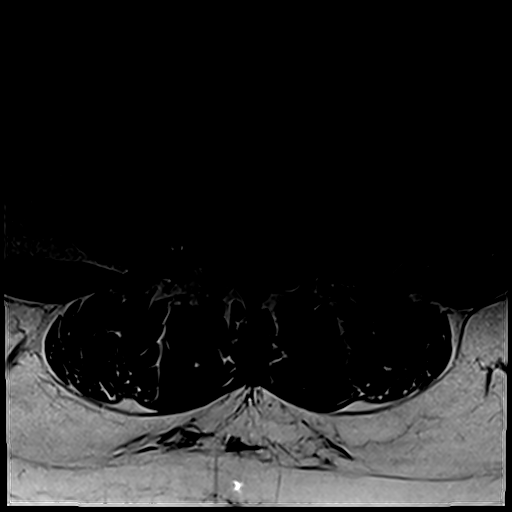
[im 17/33]
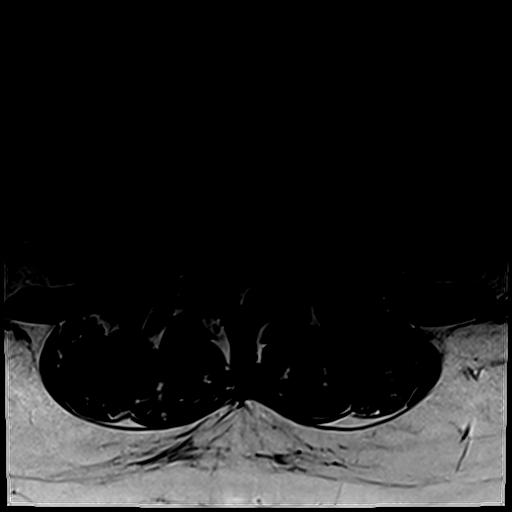
[im 20/33]
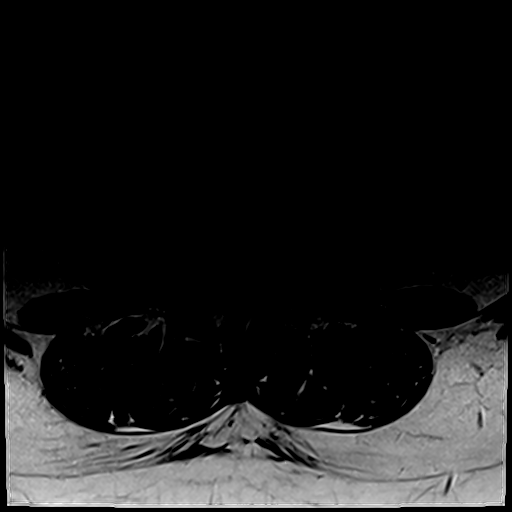
[im 23/33]
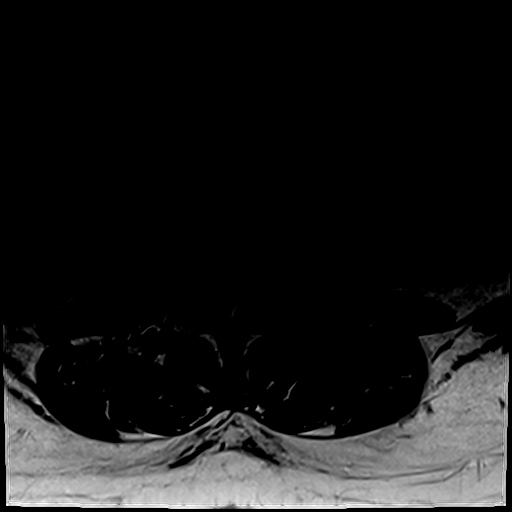
[im 29/33]
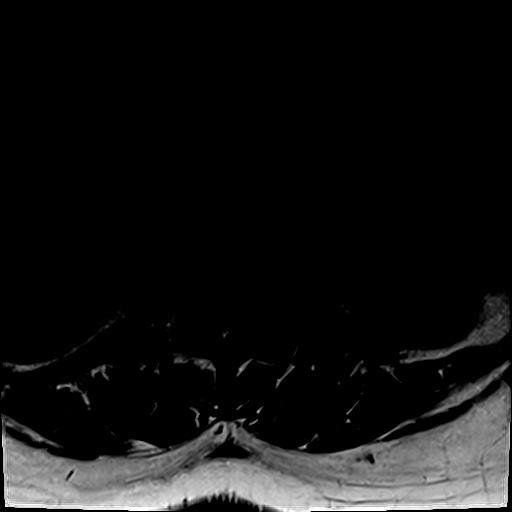

[30 of 48 positions shown; findings below may reference images not displayed]

FINDINGS: Segmentation: Examination somewhat technically limited as the STIR
sequence is not available for review at time of initial dictation.

Standard segmentation. Lowest well-formed disc space labeled the
L5-S1 level.

Alignment: Physiologic with preservation of the normal lumbar
lordosis. No listhesis.

Vertebrae: Vertebral body height well maintained without evidence
for acute or chronic fracture. Bone marrow signal intensity within
normal limits. No discrete osseous lesions. No abnormal enhancement.

Conus medullaris and cauda equina: Conus extends to the L1 level.
Conus and cauda equina appear normal.

Paraspinal and other soft tissues: Paraspinous soft tissues
demonstrate no acute finding. Visualized visceral structures are
normal.

Disc levels:

T12-L1: Unremarkable.

L1-2: Normal interspace. Mild facet hypertrophy. No canal or
foraminal stenosis.

L2-3: Negative interspace. Mild facet and ligament flavum
hypertrophy. No significant stenosis.

L3-4: Negative interspace. Mild to moderate facet and ligament
flavum hypertrophy. No significant canal or foraminal stenosis.

L4-5: Negative interspace. Mild to moderate facet hypertrophy. No
significant spinal stenosis. Foramina remain patent.

L5-S1: Degenerative intervertebral disc space narrowing with disc
desiccation and mild reactive endplate change. Persistent mild
diffuse disc bulge. Superimposed small central disc protrusion
mildly indents the ventral thecal sac (series 7, image 28). Prior
right hemi laminectomy with micro discectomy. Thecal sac and lateral
recesses remain widely patent without residual impingement or
stenosis. Persistent mild bilateral L5 foraminal narrowing, with the
bulging disc contacting the exiting right L5 nerve root as it
courses of the right neural foramen (series 6, image 3). Overall,
appearance is similar to previous.
IMPRESSION: 1. Stable postoperative changes from prior right hemi laminectomy
with micro discectomy at L5-S1. No residual or recurrent spinal
stenosis. Persistent disc bulge with mild bilateral L5 foraminal
narrowing, with the bulging disc contacting and potentially
irritating the exiting right L5 nerve root, stable.
2. Mild to moderate facet hypertrophy at L1-2 through L4-5, which
could contribute to lower back pain.
3. No other significant disc pathology or stenosis. No evidence for
neural impingement.

## 2022-05-26 ENCOUNTER — Other Ambulatory Visit (HOSPITAL_COMMUNITY): Payer: Self-pay

## 2022-05-26 DIAGNOSIS — F331 Major depressive disorder, recurrent, moderate: Secondary | ICD-10-CM | POA: Diagnosis not present

## 2022-05-26 DIAGNOSIS — F429 Obsessive-compulsive disorder, unspecified: Secondary | ICD-10-CM | POA: Diagnosis not present

## 2022-05-26 DIAGNOSIS — F902 Attention-deficit hyperactivity disorder, combined type: Secondary | ICD-10-CM | POA: Diagnosis not present

## 2022-05-26 DIAGNOSIS — F411 Generalized anxiety disorder: Secondary | ICD-10-CM | POA: Diagnosis not present

## 2022-05-26 MED ORDER — AMPHETAMINE-DEXTROAMPHETAMINE 20 MG PO TABS
ORAL_TABLET | ORAL | 0 refills | Status: DC
  Filled 2022-06-20: qty 60, 30d supply, fill #0

## 2022-05-26 MED ORDER — AMPHETAMINE-DEXTROAMPHETAMINE 20 MG PO TABS
ORAL_TABLET | ORAL | 0 refills | Status: DC
  Filled 2022-07-21: qty 60, 30d supply, fill #0

## 2022-05-26 MED ORDER — FLUOXETINE HCL 40 MG PO CAPS
ORAL_CAPSULE | ORAL | 0 refills | Status: DC
  Filled 2022-05-26: qty 90, 90d supply, fill #0

## 2022-05-26 MED ORDER — REXULTI 1 MG PO TABS
ORAL_TABLET | ORAL | 2 refills | Status: DC
  Filled 2022-05-26: qty 30, 30d supply, fill #0

## 2022-05-26 MED ORDER — BUPROPION HCL ER (XL) 150 MG PO TB24
ORAL_TABLET | ORAL | 0 refills | Status: DC
  Filled 2022-05-26: qty 90, 90d supply, fill #0

## 2022-05-26 MED ORDER — AMPHETAMINE-DEXTROAMPHETAMINE 20 MG PO TABS
ORAL_TABLET | ORAL | 0 refills | Status: DC
  Filled 2022-08-20: qty 60, 30d supply, fill #0

## 2022-05-30 ENCOUNTER — Other Ambulatory Visit (HOSPITAL_COMMUNITY): Payer: Self-pay

## 2022-05-30 MED ORDER — ARIPIPRAZOLE 2 MG PO TABS
ORAL_TABLET | ORAL | 2 refills | Status: DC
  Filled 2022-05-30: qty 30, 30d supply, fill #0
  Filled 2022-07-18: qty 30, 30d supply, fill #1

## 2022-05-30 MED ORDER — BENZTROPINE MESYLATE 0.5 MG PO TABS
ORAL_TABLET | ORAL | 0 refills | Status: DC
  Filled 2022-05-30: qty 90, 90d supply, fill #0

## 2022-05-30 MED ORDER — ARIPIPRAZOLE 2 MG PO TABS
ORAL_TABLET | ORAL | 0 refills | Status: DC
  Filled 2022-05-30: qty 90, 90d supply, fill #0

## 2022-06-02 ENCOUNTER — Ambulatory Visit: Payer: 59 | Admitting: Nurse Practitioner

## 2022-06-13 ENCOUNTER — Ambulatory Visit (HOSPITAL_BASED_OUTPATIENT_CLINIC_OR_DEPARTMENT_OTHER)
Admission: RE | Admit: 2022-06-13 | Discharge: 2022-06-13 | Disposition: A | Discharge status: Home / Self Care | Payer: 59 | Source: Ambulatory Visit | Attending: Obstetrics & Gynecology | Admitting: Obstetrics & Gynecology

## 2022-06-13 DIAGNOSIS — Z1231 Encounter for screening mammogram for malignant neoplasm of breast: Secondary | ICD-10-CM | POA: Diagnosis not present

## 2022-06-16 ENCOUNTER — Ambulatory Visit (HOSPITAL_COMMUNITY): Payer: 59

## 2022-06-20 ENCOUNTER — Other Ambulatory Visit (HOSPITAL_COMMUNITY): Payer: Self-pay

## 2022-07-18 ENCOUNTER — Other Ambulatory Visit (HOSPITAL_COMMUNITY): Payer: Self-pay

## 2022-07-21 ENCOUNTER — Other Ambulatory Visit (HOSPITAL_COMMUNITY): Payer: Self-pay

## 2022-08-20 ENCOUNTER — Other Ambulatory Visit (HOSPITAL_COMMUNITY): Payer: Self-pay

## 2022-08-25 DIAGNOSIS — F411 Generalized anxiety disorder: Secondary | ICD-10-CM | POA: Diagnosis not present

## 2022-08-25 DIAGNOSIS — F429 Obsessive-compulsive disorder, unspecified: Secondary | ICD-10-CM | POA: Diagnosis not present

## 2022-08-25 DIAGNOSIS — F902 Attention-deficit hyperactivity disorder, combined type: Secondary | ICD-10-CM | POA: Diagnosis not present

## 2022-08-25 DIAGNOSIS — F331 Major depressive disorder, recurrent, moderate: Secondary | ICD-10-CM | POA: Diagnosis not present

## 2022-08-26 ENCOUNTER — Other Ambulatory Visit (HOSPITAL_COMMUNITY): Payer: Self-pay

## 2022-08-26 MED ORDER — ARIPIPRAZOLE 2 MG PO TABS
2.0000 mg | ORAL_TABLET | Freq: Every evening | ORAL | 0 refills | Status: DC
  Filled 2022-08-26 – 2022-11-18 (×2): qty 90, 90d supply, fill #0

## 2022-08-26 MED ORDER — FLUOXETINE HCL 40 MG PO CAPS
40.0000 mg | ORAL_CAPSULE | Freq: Every day | ORAL | 0 refills | Status: DC
Start: 2022-08-25 — End: 2022-12-12
  Filled 2022-08-26 – 2022-11-18 (×2): qty 90, 90d supply, fill #0

## 2022-08-26 MED ORDER — AMPHETAMINE-DEXTROAMPHETAMINE 20 MG PO TABS
20.0000 mg | ORAL_TABLET | Freq: Two times a day (BID) | ORAL | 0 refills | Status: DC
  Filled 2022-11-20: qty 60, 30d supply, fill #0

## 2022-08-26 MED ORDER — AMPHETAMINE-DEXTROAMPHETAMINE 20 MG PO TABS
20.0000 mg | ORAL_TABLET | Freq: Two times a day (BID) | ORAL | 0 refills | Status: DC
  Filled 2022-09-19: qty 60, 30d supply, fill #0

## 2022-08-26 MED ORDER — AMPHETAMINE-DEXTROAMPHETAMINE 20 MG PO TABS
20.0000 mg | ORAL_TABLET | Freq: Two times a day (BID) | ORAL | 0 refills | Status: DC
  Filled 2022-10-21: qty 60, 30d supply, fill #0

## 2022-08-26 MED ORDER — BENZTROPINE MESYLATE 0.5 MG PO TABS
0.5000 mg | ORAL_TABLET | Freq: Every evening | ORAL | 0 refills | Status: DC | PRN
  Filled 2022-08-26: qty 90, 90d supply, fill #0

## 2022-08-28 ENCOUNTER — Other Ambulatory Visit (HOSPITAL_COMMUNITY): Payer: Self-pay

## 2022-08-28 ENCOUNTER — Encounter (HOSPITAL_COMMUNITY): Payer: Self-pay

## 2022-09-09 ENCOUNTER — Telehealth: Payer: 59 | Admitting: Nurse Practitioner

## 2022-09-09 DIAGNOSIS — R051 Acute cough: Secondary | ICD-10-CM | POA: Diagnosis not present

## 2022-09-09 DIAGNOSIS — J0100 Acute maxillary sinusitis, unspecified: Secondary | ICD-10-CM

## 2022-09-09 MED ORDER — DOXYCYCLINE HYCLATE 100 MG PO TABS: 100.0000 mg | ORAL_TABLET | Freq: Two times a day (BID) | ORAL | 0 refills | Status: AC

## 2022-09-09 NOTE — Progress Notes (Signed)
E-Visit for Sinus Problems ? ?We are sorry that you are not feeling well.  Here is how we plan to help! ? ?Based on what you have shared with me it looks like you have sinusitis.  Sinusitis is inflammation and infection in the sinus cavities of the head.  Based on your presentation I believe you most likely have Acute Bacterial Sinusitis.  This is an infection caused by bacteria and is treated with antibiotics. I have prescribed Doxycycline 100mg by mouth twice a day for 10 days. You may use an oral decongestant such as Mucinex D or if you have glaucoma or high blood pressure use plain Mucinex. Saline nasal spray help and can safely be used as often as needed for congestion.  If you develop worsening sinus pain, fever or notice severe headache and vision changes, or if symptoms are not better after completion of antibiotic, please schedule an appointment with a health care provider.   ? ?Sinus infections are not as easily transmitted as other respiratory infection, however we still recommend that you avoid close contact with loved ones, especially the very young and elderly.  Remember to wash your hands thoroughly throughout the day as this is the number one way to prevent the spread of infection! ? ?Home Care: ?Only take medications as instructed by your medical team. ?Complete the entire course of an antibiotic. ?Do not take these medications with alcohol. ?A steam or ultrasonic humidifier can help congestion.  You can place a towel over your head and breathe in the steam from hot water coming from a faucet. ?Avoid close contacts especially the very young and the elderly. ?Cover your mouth when you cough or sneeze. ?Always remember to wash your hands. ? ?Get Help Right Away If: ?You develop worsening fever or sinus pain. ?You develop a severe head ache or visual changes. ?Your symptoms persist after you have completed your treatment plan. ? ?Make sure you ?Understand these instructions. ?Will watch your  condition. ?Will get help right away if you are not doing well or get worse. ? ?Thank you for choosing an e-visit. ? ?Your e-visit answers were reviewed by a board certified advanced clinical practitioner to complete your personal care plan. Depending upon the condition, your plan could have included both over the counter or prescription medications. ? ?Please review your pharmacy choice. Make sure the pharmacy is open so you can pick up prescription now. If there is a problem, you may contact your provider through MyChart messaging and have the prescription routed to another pharmacy.  Your safety is important to us. If you have drug allergies check your prescription carefully.  ? ?For the next 24 hours you can use MyChart to ask questions about today's visit, request a non-urgent call back, or ask for a work or school excuse. ?You will get an email in the next two days asking about your experience. I hope that your e-visit has been valuable and will speed your recovery.  ? ?Meds ordered this encounter  ?Medications  ? doxycycline (VIBRA-TABS) 100 MG tablet  ?  Sig: Take 1 tablet (100 mg total) by mouth 2 (two) times daily for 10 days.  ?  Dispense:  20 tablet  ?  Refill:  0  ?  ?I spent approximately 5 minutes reviewing the patient's history, current symptoms and coordinating their plan of care today.   ?

## 2022-09-12 ENCOUNTER — Other Ambulatory Visit (HOSPITAL_COMMUNITY): Payer: Self-pay

## 2022-09-19 ENCOUNTER — Other Ambulatory Visit (HOSPITAL_COMMUNITY): Payer: Self-pay

## 2022-09-29 ENCOUNTER — Encounter: Payer: Self-pay | Admitting: Nurse Practitioner

## 2022-09-29 ENCOUNTER — Ambulatory Visit: Payer: 59 | Admitting: Nurse Practitioner

## 2022-09-29 VITALS — BP 115/82 | HR 98 | Temp 98.7°F | Ht 64.0 in | Wt 164.0 lb

## 2022-09-29 DIAGNOSIS — E782 Mixed hyperlipidemia: Secondary | ICD-10-CM | POA: Diagnosis not present

## 2022-09-29 NOTE — Progress Notes (Signed)
Established Patient Office Visit  Subjective   Patient ID: [name], female    DOB: [birth year]  Age: 41 y.o. MRN: [id number]  Chief Complaint  Patient presents with   Medical Management of Chronic Issues    6 month   Headache    Pt states it has been going on for about 2-3 weeks states it is every day     Hyperlipidemia This is a chronic problem. The current episode started more than 1 year ago. The problem is controlled. Recent lipid tests were reviewed and are variable. She has no history of diabetes. There are no known factors aggravating her hyperlipidemia. Pertinent negatives include no chest pain or myalgias. She is currently on no antihyperlipidemic treatment. There are no compliance problems.  Risk factors for coronary artery disease include dyslipidemia.    Patient Active Problem List   Diagnosis Date Noted   Nausea without vomiting 11/26/2021   Elevated LFTs 11/26/2021   Mixed incontinence 03/17/2021   Hyperlipidemia 08/17/2020   ADD (attention deficit disorder) 01/29/2016   Migraines 09/18/2015   Lumbar back pain with radiculopathy affecting right lower extremity 07/31/2015   HSIL (high grade squamous intraepithelial lesion) on Pap smear of cervix 08/22/2013   Bipolar I disorder ([place]) 08/22/2013   Bulging lumbar disc 08/17/2012   Past Medical History:  Diagnosis Date   Back pain    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, nerve impingment    Bipolar 1 disorder, mixed ([place])    Depression    Hair pulling    HSIL on Pap smear of cervix 2013   GYN- [facility]   Migraines    Past Surgical History:  Procedure Laterality Date   APPENDECTOMY  2019   CHOLECYSTECTOMY     LAPAROSCOPIC APPENDECTOMY N/A 05/03/2018   Procedure: APPENDECTOMY LAPAROSCOPIC;  Surgeon: Greer Pickerel, MD;  Location: [place];  Service: General;  Laterality: N/A;   TUBAL LIGATION     Social History   Tobacco Use   Smoking status: Some Days    Packs/day: 0.50    Types: Cigarettes    Last  attempt to quit: 06/08/2019    Years since quitting: 3.3   Smokeless tobacco: Never  Vaping Use   Vaping Use: Some days  Substance Use Topics   Alcohol use: Yes    Alcohol/week: 0.0 standard drinks of alcohol    Comment: occ   Drug use: No   Social History   Socioeconomic History   Marital status: Married    Spouse name: Not on file   Number of children: 3   Years of education: Not on file   Highest education level: Not on file  Occupational History   Not on file  Tobacco Use   Smoking status: Some Days    Packs/day: 0.50    Types: Cigarettes    Last attempt to quit: 06/08/2019    Years since quitting: 3.3   Smokeless tobacco: Never  Vaping Use   Vaping Use: Some days  Substance and Sexual Activity   Alcohol use: Yes    Alcohol/week: 0.0 standard drinks of alcohol    Comment: occ   Drug use: No   Sexual activity: Yes    Birth control/protection: Surgical    Comment: tubal  Other Topics Concern   Not on file  Social History Narrative   Not on file   Social Determinants of Health   Financial Resource Strain: Low Risk  (12/11/2021)   Overall Financial Resource Strain (CARDIA)  Difficulty of Paying Living Expenses: Not hard at all  Food Insecurity: No Food Insecurity (12/11/2021)   Hunger Vital Sign    Worried About Running Out of Food in the Last Year: Never true    Ran Out of Food in the Last Year: Never true  Transportation Needs: No Transportation Needs (12/11/2021)   PRAPARE - Hydrologist (Medical): No    Lack of Transportation (Non-Medical): No  Physical Activity: Sufficiently Active (12/11/2021)   Exercise Vital Sign    Days of Exercise per Week: 5 days    Minutes of Exercise per Session: 30 min  Stress: No Stress Concern Present (12/11/2021)   [place]    Feeling of Stress : Only a little  Social Connections: Moderately Integrated (12/11/2021)   Social  Connection and Isolation Panel [NHANES]    Frequency of Communication with Friends and Family: More than three times a week    Frequency of Social Gatherings with Friends and Family: More than three times a week    Attends Religious Services: 1 to 4 times per year    Active Member of [employer] or Organizations: No    Attends Archivist Meetings: Never    Marital Status: Married  Human resources officer Violence: Not At Risk (12/11/2021)   Humiliation, Afraid, Rape, and Kick questionnaire    Fear of Current or Ex-Partner: No    Emotionally Abused: No    Physically Abused: No    Sexually Abused: No   Family Status  Relation Name Status   Mother  Deceased   Father  Deceased   Sister  Alive   Daughter 6 Alive   MGM  Deceased   MGF  Deceased   PGM  Deceased   Family History  Problem Relation Age of Onset   Heart disease Mother    COPD Mother    Lung cancer Mother    Alcohol abuse Father    Bipolar disorder Father    Prostate cancer Father    Melanoma Paternal Grandmother    No Known Allergies    Review of Systems  Constitutional: Negative.  Negative for chills and fever.  HENT: Negative.    Respiratory: Negative.    Cardiovascular: Negative.  Negative for chest pain.  Gastrointestinal: Negative.   Genitourinary: Negative.   Musculoskeletal:  Negative for myalgias.  Skin: Negative.  Negative for itching and rash.  Neurological: Negative.   Psychiatric/Behavioral: Negative.    All other systems reviewed and are negative.     Objective:     BP 115/82   Pulse 98   Temp 98.7 F (37.1 C)   Ht _0  (1.626 m)   Wt 164 lb (74.4 kg)   LMP 09/03/2022   SpO2 100%   BMI 28.15 kg/m  BP Readings from Last 3 Encounters:  09/29/22 115/82  02/14/22 105/73  02/10/22 116/83   Wt Readings from Last 3 Encounters:  09/29/22 164 lb (74.4 kg)  02/14/22 167 lb 6.4 oz (75.9 kg)  02/10/22 166 lb (75.3 kg)      Physical Exam Vitals and nursing note reviewed.   Constitutional:      Appearance: She is well-developed and overweight.  HENT:     Head: Normocephalic.     Right Ear: External ear normal.     Left Ear: External ear normal.     Nose: Nose normal.  Eyes:     Extraocular Movements: Extraocular movements intact.  Cardiovascular:     Rate and Rhythm: Normal rate and regular rhythm.     Heart sounds: Normal heart sounds.  Pulmonary:     Effort: Pulmonary effort is normal.     Breath sounds: Normal breath sounds.  Abdominal:     General: Bowel sounds are normal.     Palpations: Abdomen is soft.  Skin:    General: Skin is warm.  Neurological:     Mental Status: She is alert.      No results found for any visits on 09/29/22.  Last CBC Lab Results  Component Value Date   WBC 6.2 10/31/2021   HGB 13.6 10/31/2021   HCT 40.4 10/31/2021   MCV 80 10/31/2021   MCH 27.0 10/31/2021   RDW 13.1 10/31/2021   PLT 302 42/35/3614   Last metabolic panel Lab Results  Component Value Date   GLUCOSE 84 10/31/2021   NA 138 10/31/2021   K 4.5 10/31/2021   CL 103 10/31/2021   CO2 23 10/31/2021   BUN 12 10/31/2021   CREATININE 0.79 10/31/2021   EGFR 98 10/31/2021   CALCIUM 9.1 10/31/2021   PROT 7.2 10/31/2021   ALBUMIN 4.2 10/31/2021   LABGLOB 3.0 10/31/2021   AGRATIO 1.4 10/31/2021   BILITOT <0.2 10/31/2021   ALKPHOS 155 (H) 10/31/2021   AST 53 (H) 10/31/2021   ALT 64 (H) 10/31/2021   ANIONGAP 11 05/03/2018   Last lipids Lab Results  Component Value Date   CHOL 222 (H) 10/31/2021   HDL 49 10/31/2021   LDLCALC 136 (H) 10/31/2021   TRIG 207 (H) 10/31/2021   CHOLHDL 4.5 (H) 10/31/2021      The 10-year ASCVD risk score (Arnett DK, et al., 2019) is: 3%    Assessment & Plan:   Problem List Items Addressed This Visit       Other   Hyperlipidemia - Primary    Patient is following up for hyperlipidemia.  Patient has been without her cholesterol medication for 1 year and is wondering if maybe her cholesterol is well  controlled.  Patient is not having any new symptoms or worsening symptoms.  Advised patient to continue on a low-cholesterol diet, reduce calories in diet, exercise and weight loss, increase hydration and incorporate vegetables in diet.  Labs completed-CBC, CMP, lipid panel follow-up in 6 months.      Relevant Orders   CBC with Differential/Platelet   CMP14+EGFR   Lipid panel   TSH    Return in about 6 months (around 03/31/2023).    Ivy Lynn, NP

## 2022-09-29 NOTE — Assessment & Plan Note (Signed)
Patient is following up for hyperlipidemia.  Patient has been without her cholesterol medication for 1 year and is wondering if maybe her cholesterol is well controlled.  Patient is not having any new symptoms or worsening symptoms.  Advised patient to continue on a low-cholesterol diet, reduce calories in diet, exercise and weight loss, increase hydration and incorporate vegetables in diet.  Labs completed-CBC, CMP, lipid panel follow-up in 6 months.

## 2022-09-29 NOTE — Patient Instructions (Signed)
Dyslipidemia Dyslipidemia is an imbalance of waxy, fat-like substances (lipids) in the blood. The body needs lipids in small amounts. Dyslipidemia often involves a high level of cholesterol or triglycerides, which are types of lipids. Common forms of dyslipidemia include: High levels of LDL cholesterol. LDL is the type of cholesterol that causes fatty deposits (plaques) to build up in the blood vessels that carry blood away from the heart (arteries). Low levels of HDL cholesterol. HDL cholesterol is the type of cholesterol that protects against heart disease. High levels of HDL remove the LDL buildup from arteries. High levels of triglycerides. Triglycerides are a fatty substance in the blood that is linked to a buildup of plaques in the arteries. What are the causes? There are two main types of dyslipidemia: primary and secondary. Primary dyslipidemia is caused by changes (mutations) in genes that are passed down through families (inherited). These mutations cause several types of dyslipidemia. Secondary dyslipidemia may be caused by various risk factors that can lead to the disease, such as lifestyle choices and certain medical conditions. What increases the risk? You are more likely to develop this condition if you are an older man or if you are a woman who has gone through menopause. Other risk factors include: Having a family history of dyslipidemia. Taking certain medicines, including birth control pills, steroids, some diuretics, and beta-blockers. Eating a diet high in saturated fat. Smoking cigarettes or excessive alcohol intake. Having certain medical conditions such as diabetes, polycystic ovary syndrome (PCOS), kidney disease, liver disease, or hypothyroidism. Not exercising regularly. Being overweight or obese with too much belly fat. What are the signs or symptoms? In most cases, dyslipidemia does not usually cause any symptoms. In severe cases, very high lipid levels can  cause: Fatty bumps under the skin (xanthomas). A white or gray ring around the black center (pupil) of the eye. Very high triglyceride levels can cause inflammation of the pancreas (pancreatitis). How is this diagnosed? Your health care provider may diagnose dyslipidemia based on a routine blood test (fasting blood test). Because most people do not have symptoms of the condition, this blood testing (lipid profile) is done on adults age 40 and older and is repeated every 4-6 years. This test checks: Total cholesterol. This measures the total amount of cholesterol in your blood, including LDL cholesterol, HDL cholesterol, and triglycerides. A healthy number is below 200 mg/dL (5.17 mmol/L). LDL cholesterol. The target number for LDL cholesterol is different for each person, depending on individual risk factors. A healthy number is usually below 100 mg/dL (2.59 mmol/L). Ask your health care provider what your LDL cholesterol should be. HDL cholesterol. An HDL level of 60 mg/dL (1.55 mmol/L) or higher is best because it helps to protect against heart disease. A number below 40 mg/dL (1.03 mmol/L) for men or below 50 mg/dL (1.29 mmol/L) for women increases the risk for heart disease. Triglycerides. A healthy triglyceride number is below 150 mg/dL (1.69 mmol/L). If your lipid profile is abnormal, your health care provider may do other blood tests. How is this treated? Treatment depends on the type of dyslipidemia that you have and your other risk factors for heart disease and stroke. Your health care provider will have a target range for your lipid levels based on this information. Treatment for dyslipidemia starts with lifestyle changes, such as diet and exercise. Your health care provider may recommend that you: Get regular exercise. Make changes to your diet. Quit smoking if you smoke. Limit your alcohol intake. If diet  changes and exercise do not help you reach your goals, your health care provider  may also prescribe medicine to lower lipids. The most commonly prescribed type of medicine lowers your LDL cholesterol (statin drug). If you have a high triglyceride level, your provider may prescribe another type of drug (fibrate) or an omega-3 fish oil supplement, or both. Follow these instructions at home: Eating and drinking  Follow instructions from your health care provider or dietitian about eating or drinking restrictions. Eat a healthy diet as told by your health care provider. This can help you reach and maintain a healthy weight, lower your LDL cholesterol, and raise your HDL cholesterol. This may include: Limiting your calories, if you are overweight. Eating more fruits, vegetables, whole grains, fish, and lean meats. Limiting saturated fat, trans fat, and cholesterol. Do not drink alcohol if: Your health care provider tells you not to drink. You are pregnant, may be pregnant, or are planning to become pregnant. If you drink alcohol: Limit how much you have to: 0-1 drink a day for women. 0-2 drinks a day for men. Know how much alcohol is in your drink. In the [place] one drink equals one 12 oz bottle of beer (355 mL), one 5 oz glass of wine (148 mL), or one 1 oz glass of hard liquor (44 mL). Activity Get regular exercise. Start an exercise and strength training program as told by your health care provider. Ask your health care provider what activities are safe for you. Your health care provider may recommend: 30 minutes of aerobic activity 4-6 days a week. Brisk walking is an example of aerobic activity. Strength training 2 days a week. General instructions Do not use any products that contain nicotine or tobacco. These products include cigarettes, chewing tobacco, and vaping devices, such as e-cigarettes. If you need help quitting, ask your health care provider. Take over-the-counter and prescription medicines only as told by your health care provider. This includes  supplements. Keep all follow-up visits. This is important. Contact a health care provider if: You are having trouble sticking to your exercise or diet plan. You are struggling to quit smoking or to control your use of alcohol. Summary Dyslipidemia often involves a high level of cholesterol or triglycerides, which are types of lipids. Treatment depends on the type of dyslipidemia that you have and your other risk factors for heart disease and stroke. Treatment for dyslipidemia starts with lifestyle changes, such as diet and exercise. Your health care provider may prescribe medicine to lower lipids. This information is not intended to replace advice given to you by your health care provider. Make sure you discuss any questions you have with your health care provider. Document Revised: 05/09/2022 Document Reviewed: 12/10/2020 Elsevier Patient Education  [place].

## 2022-09-30 ENCOUNTER — Telehealth: Payer: 59 | Admitting: Physician Assistant

## 2022-09-30 ENCOUNTER — Other Ambulatory Visit: Payer: Self-pay | Admitting: Nurse Practitioner

## 2022-09-30 DIAGNOSIS — M545 Low back pain, unspecified: Secondary | ICD-10-CM

## 2022-09-30 DIAGNOSIS — G8929 Other chronic pain: Secondary | ICD-10-CM

## 2022-09-30 LAB — CBC WITH DIFFERENTIAL/PLATELET
Basophils Absolute: 0 x10E3/uL (ref 0.0–0.2)
Basos: 1 %
EOS (ABSOLUTE): 0.2 x10E3/uL (ref 0.0–0.4)
Eos: 4 %
Hematocrit: 43.5 % (ref 34.0–46.6)
Hemoglobin: 14.1 g/dL (ref 11.1–15.9)
Immature Grans (Abs): 0 x10E3/uL (ref 0.0–0.1)
Immature Granulocytes: 0 %
Lymphocytes Absolute: 1.8 x10E3/uL (ref 0.7–3.1)
Lymphs: 31 %
MCH: 27 pg (ref 26.6–33.0)
MCHC: 32.4 g/dL (ref 31.5–35.7)
MCV: 83 fL (ref 79–97)
Monocytes Absolute: 0.4 x10E3/uL (ref 0.1–0.9)
Monocytes: 7 %
Neutrophils Absolute: 3.3 x10E3/uL (ref 1.4–7.0)
Neutrophils: 57 %
Platelets: 237 x10E3/uL (ref 150–450)
RBC: 5.22 x10E6/uL (ref 3.77–5.28)
RDW: 13.8 % (ref 11.7–15.4)
WBC: 5.8 x10E3/uL (ref 3.4–10.8)

## 2022-09-30 LAB — CMP14+EGFR
ALT: 29 IU/L (ref 0–32)
AST: 24 IU/L (ref 0–40)
Albumin/Globulin Ratio: 1.4 (ref 1.2–2.2)
Albumin: 4.1 g/dL (ref 3.9–4.9)
Alkaline Phosphatase: 113 IU/L (ref 44–121)
BUN/Creatinine Ratio: 12 (ref 9–23)
BUN: 11 mg/dL (ref 6–24)
Bilirubin Total: 0.3 mg/dL (ref 0.0–1.2)
CO2: 22 mmol/L (ref 20–29)
Calcium: 9.2 mg/dL (ref 8.7–10.2)
Chloride: 103 mmol/L (ref 96–106)
Creatinine, Ser: 0.95 mg/dL (ref 0.57–1.00)
Globulin, Total: 2.9 g/dL (ref 1.5–4.5)
Glucose: 91 mg/dL (ref 70–99)
Potassium: 4.1 mmol/L (ref 3.5–5.2)
Sodium: 137 mmol/L (ref 134–144)
Total Protein: 7 g/dL (ref 6.0–8.5)
eGFR: 78 mL/min/1.73

## 2022-09-30 LAB — LIPID PANEL
Chol/HDL Ratio: 4.4 ratio (ref 0.0–4.4)
Cholesterol, Total: 200 mg/dL — ABNORMAL HIGH (ref 100–199)
HDL: 45 mg/dL (ref >39)
LDL Chol Calc (NIH): 118 mg/dL — ABNORMAL HIGH (ref 0–99)
Triglycerides: 210 mg/dL — ABNORMAL HIGH (ref 0–149)
VLDL Cholesterol Cal: 37 mg/dL (ref 5–40)

## 2022-09-30 LAB — TSH: TSH: 2.07 u[IU]/mL (ref 0.450–4.500)

## 2022-09-30 MED ORDER — METHYLPREDNISOLONE 4 MG PO TBPK: ORAL_TABLET | ORAL | 0 refills | Status: DC

## 2022-09-30 MED ORDER — OMEGA-3 FATTY ACIDS 1000 MG PO CAPS: 2.0000 g | ORAL_CAPSULE | Freq: Every day | ORAL | 2 refills | Status: DC

## 2022-09-30 NOTE — Progress Notes (Signed)
We are sorry that you are not feeling well.  Here is how we plan to help!  Based on what you have shared with me it looks like you mostly have acute back pain.  Acute back pain is defined as musculoskeletal pain that can resolve in 1-3 weeks with conservative treatment.  I have prescribed  Medrol dose pack. This is a 6 day taper of steroid medication. May continue muscle relaxers as needed. Some patients experience stomach irritation or in increased heartburn with anti-inflammatory drugs.  Please keep in mind that muscle relaxer's can cause fatigue and should not be taken while at work or driving.  Back pain is very common.  The pain often gets better over time.  The cause of back pain is usually not dangerous.  Most people can learn to manage their back pain on their own.  Home Care Stay active.  Start with short walks on flat ground if you can.  Try to walk farther each day. Do not sit, drive or stand in one place for more than 30 minutes.  Do not stay in bed. Do not avoid exercise or work.  Activity can help your back heal faster. Be careful when you bend or lift an object.  Bend at your knees, keep the object close to you, and do not twist. Sleep on a firm mattress.  Lie on your side, and bend your knees.  If you lie on your back, put a pillow under your knees. Only take medicines as told by your doctor. Put ice on the injured area. Put ice in a plastic bag Place a towel between your skin and the bag Leave the ice on for 15-20 minutes, 3-4 times a day for the first 2-3 days. 210 After that, you can switch between ice and heat packs. Ask your doctor about back exercises or massage. Avoid feeling anxious or stressed.  Find good ways to deal with stress, such as exercise.  Get Help Right Way If: Your pain does not go away with rest or medicine. Your pain does not go away in 1 week. You have new problems. You do not feel well. The pain spreads into your legs. You cannot control when you  poop (bowel movement) or pee (urinate) You feel sick to your stomach (nauseous) or throw up (vomit) You have belly (abdominal) pain. You feel like you may pass out (faint). If you develop a fever.  Make Sure you: Understand these instructions. Will watch your condition Will get help right away if you are not doing well or get worse.  Your e-visit answers were reviewed by a board certified advanced clinical practitioner to complete your personal care plan.  Depending on the condition, your plan could have included both over the counter or prescription medications.  If there is a problem please reply  once you have received a response from your provider.  Your safety is important to [place].  If you have drug allergies check your prescription carefully.    You can use MyChart to ask questions about today's visit, request a non-urgent call back, or ask for a work or school excuse for 24 hours related to this e-Visit. If it has been greater than 24 hours you will need to follow up with your provider, or enter a new e-Visit to address those concerns.  You will get an e-mail in the next two days asking about your experience.  I hope that your e-visit has been valuable and will speed your recovery. Thank  you for using e-visits.  I have spent 5 minutes in review of e-visit questionnaire, review and updating patient chart, medical decision making and response to patient.   Mar Daring, PA-C

## 2022-10-01 ENCOUNTER — Encounter: Payer: Self-pay | Admitting: Nurse Practitioner

## 2022-10-03 ENCOUNTER — Other Ambulatory Visit: Payer: Self-pay | Admitting: Nurse Practitioner

## 2022-10-03 ENCOUNTER — Other Ambulatory Visit (HOSPITAL_COMMUNITY): Payer: Self-pay

## 2022-10-03 ENCOUNTER — Other Ambulatory Visit: Payer: Self-pay

## 2022-10-03 MED ORDER — TOPIRAMATE 100 MG PO TABS
100.0000 mg | ORAL_TABLET | Freq: Two times a day (BID) | ORAL | 2 refills | Status: DC
  Filled 2022-10-03: qty 30, 15d supply, fill #0

## 2022-10-21 ENCOUNTER — Other Ambulatory Visit: Payer: Self-pay | Admitting: Family Medicine

## 2022-10-21 ENCOUNTER — Other Ambulatory Visit (HOSPITAL_COMMUNITY): Payer: Self-pay

## 2022-10-21 DIAGNOSIS — G43109 Migraine with aura, not intractable, without status migrainosus: Secondary | ICD-10-CM

## 2022-10-21 MED ORDER — RIZATRIPTAN BENZOATE 5 MG PO TABS
5.0000 mg | ORAL_TABLET | ORAL | 0 refills | Status: DC | PRN
  Filled 2022-10-21 – 2022-10-23 (×2): qty 10, 30d supply, fill #0

## 2022-10-23 ENCOUNTER — Other Ambulatory Visit (HOSPITAL_COMMUNITY): Payer: Self-pay

## 2022-10-23 ENCOUNTER — Other Ambulatory Visit: Payer: Self-pay

## 2022-11-07 ENCOUNTER — Encounter: Payer: Self-pay | Admitting: Nurse Practitioner

## 2022-11-18 ENCOUNTER — Other Ambulatory Visit: Payer: Self-pay

## 2022-11-18 ENCOUNTER — Other Ambulatory Visit (HOSPITAL_COMMUNITY): Payer: Self-pay

## 2022-11-20 ENCOUNTER — Other Ambulatory Visit (HOSPITAL_COMMUNITY): Payer: Self-pay

## 2022-12-10 ENCOUNTER — Other Ambulatory Visit (HOSPITAL_COMMUNITY): Payer: Self-pay

## 2022-12-11 DIAGNOSIS — F411 Generalized anxiety disorder: Secondary | ICD-10-CM | POA: Diagnosis not present

## 2022-12-11 DIAGNOSIS — F331 Major depressive disorder, recurrent, moderate: Secondary | ICD-10-CM | POA: Diagnosis not present

## 2022-12-11 DIAGNOSIS — F902 Attention-deficit hyperactivity disorder, combined type: Secondary | ICD-10-CM | POA: Diagnosis not present

## 2022-12-11 DIAGNOSIS — F429 Obsessive-compulsive disorder, unspecified: Secondary | ICD-10-CM | POA: Diagnosis not present

## 2022-12-12 ENCOUNTER — Other Ambulatory Visit: Payer: Self-pay

## 2022-12-12 ENCOUNTER — Other Ambulatory Visit (HOSPITAL_COMMUNITY): Payer: Self-pay

## 2022-12-12 MED ORDER — ARIPIPRAZOLE 2 MG PO TABS: 2.0000 mg | ORAL_TABLET | Freq: Every day | ORAL | 0 refills | Status: DC

## 2022-12-12 MED ORDER — BUPROPION HCL ER (XL) 150 MG PO TB24
150.0000 mg | ORAL_TABLET | Freq: Every day | ORAL | 0 refills | Status: DC
  Filled 2022-12-12: qty 90, 90d supply, fill #0

## 2022-12-12 MED ORDER — FLUOXETINE HCL 40 MG PO CAPS: 40.0000 mg | ORAL_CAPSULE | Freq: Every day | ORAL | 0 refills | Status: DC

## 2022-12-12 MED ORDER — ALPRAZOLAM 0.5 MG PO TABS
0.2500 mg | ORAL_TABLET | Freq: Every day | ORAL | 1 refills | Status: DC | PRN
  Filled 2022-12-12: qty 30, 30d supply, fill #0

## 2022-12-12 MED ORDER — AMPHETAMINE-DEXTROAMPHETAMINE 20 MG PO TABS
20.0000 mg | ORAL_TABLET | Freq: Two times a day (BID) | ORAL | 0 refills | Status: DC
  Filled 2023-02-18: qty 60, 30d supply, fill #0

## 2022-12-12 MED ORDER — AMPHETAMINE-DEXTROAMPHETAMINE 20 MG PO TABS
20.0000 mg | ORAL_TABLET | Freq: Two times a day (BID) | ORAL | 0 refills | Status: DC
  Filled 2023-01-19 (×2): qty 60, 30d supply, fill #0

## 2022-12-12 MED ORDER — AMPHETAMINE-DEXTROAMPHETAMINE 20 MG PO TABS
20.0000 mg | ORAL_TABLET | Freq: Two times a day (BID) | ORAL | 0 refills | Status: DC
  Filled 2022-12-19: qty 60, 30d supply, fill #0

## 2022-12-12 MED ORDER — BENZTROPINE MESYLATE 0.5 MG PO TABS
0.5000 mg | ORAL_TABLET | Freq: Every day | ORAL | 0 refills | Status: DC
  Filled 2022-12-12: qty 90, 90d supply, fill #0

## 2022-12-13 ENCOUNTER — Other Ambulatory Visit (HOSPITAL_COMMUNITY): Payer: Self-pay

## 2022-12-15 ENCOUNTER — Other Ambulatory Visit (HOSPITAL_COMMUNITY): Payer: Self-pay

## 2022-12-18 ENCOUNTER — Encounter: Payer: Self-pay | Admitting: Radiology

## 2022-12-19 ENCOUNTER — Other Ambulatory Visit (HOSPITAL_COMMUNITY): Payer: Self-pay

## 2022-12-26 ENCOUNTER — Ambulatory Visit: Payer: Commercial Managed Care - PPO | Admitting: Family Medicine

## 2022-12-26 ENCOUNTER — Encounter: Payer: Self-pay | Admitting: Family Medicine

## 2022-12-26 VITALS — BP 114/75 | HR 67 | Ht 64.0 in | Wt 168.0 lb

## 2022-12-26 DIAGNOSIS — B9789 Other viral agents as the cause of diseases classified elsewhere: Secondary | ICD-10-CM

## 2022-12-26 DIAGNOSIS — J988 Other specified respiratory disorders: Secondary | ICD-10-CM

## 2022-12-26 MED ORDER — ALBUTEROL SULFATE HFA 108 (90 BASE) MCG/ACT IN AERS: 2.0000 | INHALATION_SPRAY | Freq: Four times a day (QID) | RESPIRATORY_TRACT | 1 refills | Status: DC | PRN

## 2022-12-26 MED ORDER — BENZONATATE 100 MG PO CAPS
200.0000 mg | ORAL_CAPSULE | Freq: Two times a day (BID) | ORAL | 0 refills | Status: DC | PRN
  Filled 2022-12-26: qty 30, 8d supply, fill #0

## 2022-12-26 MED ORDER — PROMETHAZINE-DM 6.25-15 MG/5ML PO SYRP
5.0000 mL | ORAL_SOLUTION | Freq: Four times a day (QID) | ORAL | 0 refills | Status: DC | PRN
  Filled 2022-12-26: qty 118, 6d supply, fill #0

## 2022-12-26 NOTE — Progress Notes (Signed)
BP 114/75   Pulse 67   Ht '5\' 4"'$  (1.626 m)   Wt 76.2 kg   SpO2 97%   BMI 28.84 kg/m    Subjective:   Patient ID: [name], female    DOB: Apr 24, 1981, 42 y.o.   MRN: [id number]  HPI: [name] is a 42 y.o. female presenting on 12/26/2022 for URI  Patient presents with low grade fever (99.5 deg Fahrenheit) and sore throat that started 4 days ago on Monday and has progressed to a cough, congestion and rhinorrhea since yesterday with minor body aches and chills. Patient states she is feeling some chest tightness , heaviness, and as if she cannot breathe, and it is affecting her sleep. Patient states several members of her daughter's volleyball team had RSV last week. Patient states her pain level is a 6/10 when coughing up yellow phlegm. Patient has been using Mucinex at night with some relief. Patient took a home COVID test and Influenza and strep tests at her work in [place] on Wednesday, and they all came back negative.  Relevant past medical, surgical, family and social history were reviewed and updated as indicated. Interim medical history were reviewed. Allergies and medications were reviewed and updated.  Review of Systems  Constitutional:  Positive for chills and fever. Negative for fatigue.  HENT:  Positive for congestion and rhinorrhea. Negative for ear discharge, ear pain, sinus pressure, sinus pain and sore throat.   Eyes:  Negative for pain, discharge and redness.  Respiratory:  Positive for cough, chest tightness and shortness of breath. Negative for wheezing.   Cardiovascular:  Negative for chest pain and leg swelling.  Gastrointestinal:  Negative for abdominal pain.  Musculoskeletal:  Positive for myalgias. Negative for back pain.  Skin:  Negative for rash.  Psychiatric/Behavioral:  Positive for sleep disturbance.   All other systems reviewed and are negative.   Per HPI unless specifically indicated above.   Allergies as of 12/26/2022   No Known Allergies       Medication List        Accurate as of December 26, 2022  9:56 AM. If you have any questions, ask your nurse or doctor.          STOP taking these medications    ALPRAZolam 0.5 MG tablet Commonly known as: Duanne Moron Stopped by: Fransisca Kaufmann Floye Fesler, MD   ibuprofen 600 MG tablet Commonly known as: ADVIL Stopped by: Fransisca Kaufmann Vipul Cafarelli, MD   methylPREDNISolone 4 MG Tbpk tablet Commonly known as: MEDROL DOSEPAK Stopped by: Fransisca Kaufmann Sarinah Doetsch, MD   topiramate 100 MG tablet Commonly known as: Topamax Stopped by: Worthy Rancher, MD       TAKE these medications    albuterol 108 (90 Base) MCG/ACT inhaler Commonly known as: VENTOLIN HFA Inhale 2 puffs into the lungs every 6 (six) hours as needed for wheezing or shortness of breath. Started by: Worthy Rancher, MD   amphetamine-dextroamphetamine 20 MG tablet Commonly known as: ADDERALL Take 1 tablet (20 mg total) by mouth 2 (two) times daily for ADHD What changed: Another medication with the same name was removed. Continue taking this medication, and follow the directions you see here. Changed by: Worthy Rancher, MD   amphetamine-dextroamphetamine 20 MG tablet Commonly known as: ADDERALL Take 1 tablet (20 mg total) by mouth 2 (two) times daily for adhd (4/1) Start taking on: January 19, 2023 What changed: Another medication with the same name was removed. Continue taking this medication, and  follow the directions you see here. Changed by: Worthy Rancher, MD   amphetamine-dextroamphetamine 20 MG tablet Commonly known as: ADDERALL Take 1 tablet (20 mg total) by mouth 2 (two) times daily for ADHD Start taking on: Feb 18, 2023 What changed: Another medication with the same name was removed. Continue taking this medication, and follow the directions you see here. Changed by: Fransisca Kaufmann Mozel Burdett, MD   ARIPiprazole 2 MG tablet Commonly known as: ABILIFY Take 1 tablet (2 mg total) by mouth at bedtime for OCD What  changed: Another medication with the same name was removed. Continue taking this medication, and follow the directions you see here. Changed by: Fransisca Kaufmann Ritisha Deitrick, MD   benztropine 0.5 MG tablet Commonly known as: COGENTIN Take 1 tablet (0.5 mg total) by mouth at bedtime for EPS Prevention What changed: Another medication with the same name was removed. Continue taking this medication, and follow the directions you see here. Changed by: Fransisca Kaufmann Branden Shallenberger, MD   buPROPion 150 MG 24 hr tablet Commonly known as: WELLBUTRIN XL Take 1 tablet (150 mg total) by mouth daily for depression What changed: Another medication with the same name was removed. Continue taking this medication, and follow the directions you see here. Changed by: Fransisca Kaufmann Dozier Berkovich, MD   fish oil-omega-3 fatty acids 1000 MG capsule Take 2 capsules (2 g total) by mouth daily.   FLUoxetine 40 MG capsule Commonly known as: PROZAC Take 1 capsule (40 mg total) by mouth daily for depression/anxiety   omeprazole 20 MG capsule Commonly known as: PRILOSEC Take 1 capsule (20 mg total) by mouth daily.   phenazopyridine 200 MG tablet Commonly known as: Pyridium Take 1 tablet (200 mg total) by mouth 3 (three) times daily as needed for pain.   Rexulti 0.5 MG Tabs Generic drug: Brexpiprazole Take 1 tablet(s) by mouth at bedtime for 1 week, then take 2 tablets at bedtime   Rexulti 1 MG Tabs tablet Generic drug: brexpiprazole Take 1 tablet (1 mg total) by mouth at bedtime.   Rexulti 1 MG Tabs tablet Generic drug: brexpiprazole Take 1 tablet by mouth at bedtime   rizatriptan 5 MG tablet Commonly known as: MAXALT Take 1 tablet (5 mg total) by mouth as needed for migraine. May repeat in 2 hours if needed (NEEDS TO BE SEEN BEFORE NEXT REFILL)         Objective:   BP 114/75   Pulse 67   Ht '5\' 4"'$  (1.626 m)   Wt 76.2 kg   SpO2 97%   BMI 28.84 kg/m   Wt Readings from Last 3 Encounters:  12/26/22 76.2 kg  09/29/22  74.4 kg  02/14/22 75.9 kg    Physical Exam Vitals reviewed.  Constitutional:      Appearance: Normal appearance.  HENT:     Right Ear: Tympanic membrane, ear canal and external ear normal.     Left Ear: Tympanic membrane, ear canal and external ear normal.     Mouth/Throat:     Mouth: Mucous membranes are moist.     Pharynx: Oropharynx is clear. No oropharyngeal exudate or posterior oropharyngeal erythema.  Eyes:     General: No scleral icterus.       Right eye: No discharge.        Left eye: No discharge.     Conjunctiva/sclera: Conjunctivae normal.  Cardiovascular:     Rate and Rhythm: Normal rate and regular rhythm.     Heart sounds: Normal heart sounds.  Pulmonary:  Effort: Pulmonary effort is normal. No tachypnea, bradypnea or respiratory distress.     Breath sounds: Normal breath sounds and air entry. No wheezing, rhonchi or rales.  Abdominal:     Tenderness: There is no right CVA tenderness or left CVA tenderness.  Musculoskeletal:     Cervical back: Neck supple. No tenderness.     Right lower leg: No edema.     Left lower leg: No edema.  Skin:    General: Skin is warm and dry.  Neurological:     Mental Status: She is alert and oriented to person, place, and time.  Psychiatric:        Mood and Affect: Mood normal.        Behavior: Behavior normal.       Assessment & Plan:   Problem List Items Addressed This Visit   None Visit Diagnoses     Viral respiratory infection    -  Primary   Relevant Medications   albuterol (VENTOLIN HFA) 108 (90 Base) MCG/ACT inhaler      Patient was offered RSV testing, but denied due to a positive result not changing the treatment and management plan. Patient was given an albuterol inhaler and spacer to help with breathing. Patient was instructed to continue using Mucinex daily and take Benadryl at night to help with sleep. Patient has Tessalon pearls at home that she will use for suppressing her cough. Patient states she will  purchase a humidifier as well.  Follow up plan: Return if symptoms worsen or fail to improve.  Counseling provided for all of the vaccine components No orders of the defined types were placed in this encounter.   Summer Chupadero, PA-S2 [facility] 12/26/2022, 9:56 AM  I was personally present for all components of the history, physical exam and/or medical decision making.  I agree with the documentation performed by the PA student and agree with assessment and plan above.  PA student.  Agree with assessment plan above, likely viral patient with exposure to possible RSV.  Conservative management and let us know if anything worsens.  Will give albuterol inhaler to help with chest congestion. Vonna Kotyk Doninique Lwin [facility] 12/26/2022, 9:56 AM

## 2022-12-27 ENCOUNTER — Other Ambulatory Visit (HOSPITAL_COMMUNITY): Payer: Self-pay

## 2023-01-02 ENCOUNTER — Other Ambulatory Visit: Payer: Self-pay

## 2023-01-02 ENCOUNTER — Other Ambulatory Visit (HOSPITAL_COMMUNITY): Payer: Self-pay

## 2023-01-02 ENCOUNTER — Encounter: Payer: Self-pay | Admitting: Family Medicine

## 2023-01-02 ENCOUNTER — Ambulatory Visit (HOSPITAL_BASED_OUTPATIENT_CLINIC_OR_DEPARTMENT_OTHER): Payer: Commercial Managed Care - PPO | Admitting: Orthopaedic Surgery

## 2023-01-02 ENCOUNTER — Ambulatory Visit: Payer: Commercial Managed Care - PPO | Admitting: Family Medicine

## 2023-01-02 ENCOUNTER — Telehealth: Payer: Self-pay

## 2023-01-02 ENCOUNTER — Encounter (HOSPITAL_BASED_OUTPATIENT_CLINIC_OR_DEPARTMENT_OTHER): Payer: Self-pay

## 2023-01-02 VITALS — BP 105/69 | HR 86 | Temp 98.2°F | Ht 64.0 in | Wt 167.1 lb

## 2023-01-02 DIAGNOSIS — G43709 Chronic migraine without aura, not intractable, without status migrainosus: Secondary | ICD-10-CM | POA: Diagnosis not present

## 2023-01-02 MED ORDER — QULIPTA 30 MG PO TABS
60.0000 mg | ORAL_TABLET | Freq: Every day | ORAL | 1 refills | Status: DC
  Filled 2023-01-02 – 2023-01-09 (×2): qty 180, 90d supply, fill #0

## 2023-01-02 NOTE — Progress Notes (Signed)
Acute Office Visit  Subjective:     Patient ID: [name], female    DOB: 20-Mar-1981, 42 y.o.   MRN: [id number]  Chief Complaint  Patient presents with   Migraine    Migraine  This is a chronic problem. The current episode started more than 1 year ago. Episode frequency: >15 days a month. The problem has been gradually worsening. The pain is located in the Bilateral region. The quality of the pain is described as aching and squeezing. The pain is moderate. Associated symptoms include nausea, phonophobia, photophobia and vomiting. Pertinent negatives include no abnormal behavior, drainage, ear pain, eye pain, eye redness, eye watering, facial sweating, fever, numbness, rhinorrhea, seizures, tingling or visual change. The symptoms are aggravated by activity, bright light, emotional stress, weather changes and work. She has tried triptans, acetaminophen, NSAIDs, cold packs and darkened room (topamax- couldn't tolerate side effect. Gabapentin. Ubrevley.) for the symptoms. The treatment provided moderate relief.    Review of Systems  Constitutional:  Negative for fever.  HENT:  Negative for ear pain and rhinorrhea.   Eyes:  Positive for photophobia. Negative for pain and redness.  Gastrointestinal:  Positive for nausea and vomiting.  Neurological:  Negative for tingling, seizures and numbness.        Objective:    BP 105/69   Pulse 86   Temp 98.2 F (36.8 C) (Temporal)   Ht 5\' 4"  (1.626 m)   Wt 167 lb 2 oz (75.8 kg)   SpO2 97%   BMI 28.69 kg/m    Physical Exam Vitals and nursing note reviewed.  Constitutional:      General: She is not in acute distress.    Appearance: Normal appearance. She is not ill-appearing, toxic-appearing or diaphoretic.  Eyes:     Extraocular Movements: Extraocular movements intact.     Pupils: Pupils are equal, round, and reactive to light.  Cardiovascular:     Rate and Rhythm: Normal rate and regular rhythm.     Heart sounds: Normal heart  sounds. No murmur heard. Pulmonary:     Effort: Pulmonary effort is normal.     Breath sounds: Normal breath sounds.  Musculoskeletal:     Right lower leg: No edema.     Left lower leg: No edema.  Skin:    General: Skin is warm and dry.  Neurological:     General: No focal deficit present.     Mental Status: She is alert and oriented to person, place, and time.     Motor: No weakness.     Gait: Gait normal.  Psychiatric:        Mood and Affect: Mood normal.        Behavior: Behavior normal.     No results found for any visits on 01/02/23.      Assessment & Plan:   [name] was seen today for migraine.  Diagnoses and all orders for this visit:  Chronic migraine w/o aura w/o status migrainosus, not intractable Uncontrolled with >15 migraines per month. She has failed topamax and gabapentin for prevention. She has failed Excedrin, tylenol, good powders for breakthrough. Maxlaxt is currently reliving migraines. Will start Qulipta as below for prevention.  -     Atogepant (QULIPTA) 30 MG TABS; Take 2 tablets (60 mg total) by mouth daily.   Return in about 6 weeks (around 02/13/2023) for establish care and migraine follow up.  The patient indicates understanding of these issues and agrees with the plan.  [name] M  Lilia Pro, [place]

## 2023-01-02 NOTE — Telephone Encounter (Signed)
Kate Sable (KeyGerre Scull) PA Case ID #: [id number] Rx #: [id number] Need Help? Call us at [phone] Status sent iconSent to Plan today Drug Qulipta 30MG  tablets [place] Form 2017 NCPDP

## 2023-01-09 ENCOUNTER — Other Ambulatory Visit (HOSPITAL_COMMUNITY): Payer: Self-pay

## 2023-01-09 MED ORDER — QULIPTA 60 MG PO TABS
60.0000 mg | ORAL_TABLET | Freq: Every day | ORAL | 3 refills | Status: DC
  Filled 2023-01-09: qty 90, 90d supply, fill #0

## 2023-01-09 NOTE — Telephone Encounter (Signed)
Patient can not get qulipta 30 mg bid they will only pay for daily. Can you switch to 60mg  daily please?

## 2023-01-09 NOTE — Telephone Encounter (Signed)
PA approved from 01/02/23-07/01/23. Patient aware.

## 2023-01-12 ENCOUNTER — Other Ambulatory Visit: Payer: Self-pay

## 2023-01-13 ENCOUNTER — Other Ambulatory Visit: Payer: Self-pay

## 2023-01-16 ENCOUNTER — Other Ambulatory Visit (HOSPITAL_COMMUNITY): Payer: Self-pay

## 2023-01-19 ENCOUNTER — Other Ambulatory Visit (HOSPITAL_COMMUNITY): Payer: Self-pay

## 2023-01-20 ENCOUNTER — Encounter: Payer: Self-pay | Admitting: Family Medicine

## 2023-01-20 ENCOUNTER — Telehealth: Payer: Commercial Managed Care - PPO | Admitting: Family Medicine

## 2023-01-20 DIAGNOSIS — M5412 Radiculopathy, cervical region: Secondary | ICD-10-CM | POA: Diagnosis not present

## 2023-01-20 MED ORDER — PREDNISONE 20 MG PO TABS: 40.0000 mg | ORAL_TABLET | Freq: Every day | ORAL | 0 refills | Status: AC

## 2023-01-20 NOTE — Progress Notes (Signed)
Virtual Visit via MyChart Video Note Due to COVID-19 pandemic this visit was conducted virtually. This visit type was conducted due to national recommendations for restrictions regarding the COVID-19 Pandemic (e.g. social distancing, sheltering in place) in an effort to limit this patient's exposure and mitigate transmission in our community. All issues noted in this document were discussed and addressed.  A physical exam was not performed with this format.   I connected with [name] on 01/20/2023 at 1230 by MyChart Video and verified that I am speaking with the correct person using two identifiers. [name] is currently located in her car, parked and patient is currently with them during visit. The provider, Monia Pouch, FNP is located in their office at time of visit.  I discussed the limitations, risks, security and privacy concerns of performing an evaluation and management service by virtual visit and the availability of in person appointments. I also discussed with the patient that there may be a patient responsible charge related to this service. The patient expressed understanding and agreed to proceed.  Subjective:  Patient ID: [name], female    DOB: Mar 16, 1981, 42 y.o.   MRN: [id number]  Chief Complaint:  Neck Pain   HPI: [name] is a 42 y.o. female presenting on 01/20/2023 for Neck Pain   Pt states he helped to move a couch on Saturday. States Sunday her neck, left shoulder, and left arm started hurting. She has been taking motrin with minimal relief of the symptoms. Denies fatigue, diaphoresis, shortness of breath, chest pain, dizziness, palpitations, or syncope.   Neck Pain  This is a new problem. The current episode started in the past 7 days. The problem occurs constantly. The problem has been waxing and waning. The pain is associated with lifting a heavy object. The pain is present in the left side. The quality of the pain is described as aching, burning  and shooting. The pain is moderate. The symptoms are aggravated by position. Associated symptoms include paresis and tingling. Pertinent negatives include no chest pain, fever, headaches, leg pain, numbness, pain with swallowing, photophobia, syncope, trouble swallowing, visual change, weakness or weight loss. She has tried heat and NSAIDs for the symptoms. The treatment provided mild relief.     Relevant past medical, surgical, family, and social history reviewed and updated as indicated.  Allergies and medications reviewed and updated.   Past Medical History:  Diagnosis Date   Back pain    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, nerve impingment    Bipolar 1 disorder, mixed    Depression    Hair pulling    HSIL on Pap smear of cervix 2013   [facility]   Migraines     Past Surgical History:  Procedure Laterality Date   APPENDECTOMY  2019   CHOLECYSTECTOMY     LAPAROSCOPIC APPENDECTOMY N/A 05/03/2018   Procedure: APPENDECTOMY LAPAROSCOPIC;  Surgeon: Greer Pickerel, MD;  Location: [facility];  Service: General;  Laterality: N/A;   TUBAL LIGATION      Social History   Socioeconomic History   Marital status: Married    Spouse name: Not on file   Number of children: 3   Years of education: Not on file   Highest education level: Not on file  Occupational History   Not on file  Tobacco Use   Smoking status: Some Days    Packs/day: .5    Types: Cigarettes    Last attempt to quit: 06/08/2019  Years since quitting: 3.6   Smokeless tobacco: Never  Vaping Use   Vaping Use: Some days  Substance and Sexual Activity   Alcohol use: Yes    Alcohol/week: 0.0 standard drinks of alcohol    Comment: occ   Drug use: No   Sexual activity: Yes    Birth control/protection: Surgical    Comment: tubal  Other Topics Concern   Not on file  Social History Narrative   Not on file   Social Determinants of Health   Financial Resource Strain: Low Risk  (12/11/2021)   Overall Financial Resource  Strain (CARDIA)    Difficulty of Paying Living Expenses: Not hard at all  Food Insecurity: No Food Insecurity (12/11/2021)   Hunger Vital Sign    Worried About Running Out of Food in the Last Year: Never true    Ran Out of Food in the Last Year: Never true  Transportation Needs: No Transportation Needs (12/11/2021)   PRAPARE - Transportation    Lack of Transportation (Medical): No    Lack of Transportation (Non-Medical): No  Physical Activity: Sufficiently Active (12/11/2021)   Exercise Vital Sign    Days of Exercise per Week: 5 days    Minutes of Exercise per Session: 30 min  Stress: No Stress Concern Present (12/11/2021)   [place]    Feeling of Stress : Only a little  Social Connections: Moderately Integrated (12/11/2021)   Social Connection and Isolation Panel [NHANES]    Frequency of Communication with Friends and Family: More than three times a week    Frequency of Social Gatherings with Friends and Family: More than three times a week    Attends Religious Services: 1 to 4 times per year    Active Member of [employer] or Organizations: No    Attends Archivist Meetings: Never    Marital Status: Married  Human resources officer Violence: Not At Risk (12/11/2021)   Humiliation, Afraid, Rape, and Kick questionnaire    Fear of Current or Ex-Partner: No    Emotionally Abused: No    Physically Abused: No    Sexually Abused: No    Outpatient Encounter Medications as of 01/20/2023  Medication Sig   predniSONE (DELTASONE) 20 MG tablet Take 2 tablets (40 mg total) by mouth daily with breakfast for 5 days. 2 po daily for 5 days   albuterol (VENTOLIN HFA) 108 (90 Base) MCG/ACT inhaler Inhale 2 puffs into the lungs every 6 (six) hours as needed for wheezing or shortness of breath.   amphetamine-dextroamphetamine (ADDERALL) 20 MG tablet Take 1 tablet (20 mg total) by mouth 2 (two) times daily for adhd (4/1)    amphetamine-dextroamphetamine (ADDERALL) 20 MG tablet Take 1 tablet (20 mg total) by mouth 2 (two) times daily for ADHD   [START ON 02/18/2023] amphetamine-dextroamphetamine (ADDERALL) 20 MG tablet Take 1 tablet (20 mg total) by mouth 2 (two) times daily for ADHD   ARIPiprazole (ABILIFY) 2 MG tablet Take 1 tablet (2 mg total) by mouth at bedtime for OCD   Atogepant (QULIPTA) 60 MG TABS Take 1 tablet (60 mg total) by mouth daily.   benzonatate (TESSALON PERLES) 100 MG capsule Take 2 capsules (200 mg total) by mouth 2 (two) times daily as needed for cough.   fish oil-omega-3 fatty acids 1000 MG capsule Take 2 capsules (2 g total) by mouth daily.   FLUoxetine (PROZAC) 40 MG capsule Take 1 capsule (40 mg total) by mouth daily for depression/anxiety  gabapentin (NEURONTIN) 300 MG capsule Take 300 mg by mouth 3 (three) times daily.   omeprazole (PRILOSEC) 20 MG capsule Take 1 capsule (20 mg total) by mouth daily.   rizatriptan (MAXALT) 5 MG tablet Take 1 tablet (5 mg total) by mouth as needed for migraine. May repeat in 2 hours if needed (NEEDS TO BE SEEN BEFORE NEXT REFILL)   No facility-administered encounter medications on file as of 01/20/2023.    No Known Allergies  Review of Systems  Constitutional:  Negative for activity change, appetite change, chills, diaphoresis, fatigue, fever, unexpected weight change and weight loss.  HENT: Negative.  Negative for trouble swallowing.   Eyes: Negative.  Negative for photophobia.  Respiratory:  Negative for apnea, cough, choking, chest tightness, shortness of breath, wheezing and stridor.   Cardiovascular:  Negative for chest pain, palpitations, leg swelling and syncope.  Gastrointestinal:  Negative for abdominal pain, blood in stool, constipation, diarrhea, nausea and vomiting.  Endocrine: Negative.   Genitourinary:  Negative for decreased urine volume, difficulty urinating, dysuria, frequency and urgency.  Musculoskeletal:  Positive for myalgias and neck  pain. Negative for arthralgias, back pain, gait problem, joint swelling and neck stiffness.  Skin: Negative.   Allergic/Immunologic: Negative.   Neurological:  Positive for tingling. Negative for dizziness, tremors, seizures, syncope, facial asymmetry, speech difficulty, weakness, light-headedness, numbness and headaches.  Hematological: Negative.   Psychiatric/Behavioral:  Negative for confusion, hallucinations, sleep disturbance and suicidal ideas.   All other systems reviewed and are negative.        Observations/Objective: No vital signs or physical exam, this was a virtual health encounter.  Pt alert and oriented, answers all questions appropriately, and able to speak in full sentences.    Assessment and Plan: [name] was seen today for neck pain.  Diagnoses and all orders for this visit:  Cervical radiculopathy Red flags of ACS discussed in detail, no concerns for ACS at present. Cervical radiculopathy management discussed in detail. Medications as prescribed. Aware to report new, worsening, or persistent symptoms. Aware of symptoms which require emergent evaluation and treatment.  -     predniSONE (DELTASONE) 20 MG tablet; Take 2 tablets (40 mg total) by mouth daily with breakfast for 5 days. 2 po daily for 5 days     Follow Up Instructions: Return if symptoms worsen or fail to improve.    I discussed the assessment and treatment plan with the patient. The patient was provided an opportunity to ask questions and all were answered. The patient agreed with the plan and demonstrated an understanding of the instructions.   The patient was advised to call back or seek an in-person evaluation if the symptoms worsen or if the condition fails to improve as anticipated.  The above assessment and management plan was discussed with the patient. The patient verbalized understanding of and has agreed to the management plan. Patient is aware to call the clinic if they develop any new  symptoms or if symptoms persist or worsen. Patient is aware when to return to the clinic for a follow-up visit. Patient educated on when it is appropriate to go to the emergency department.    I provided 12 minutes of time during this MyChart Video encounter.   Monia Pouch, FNP-C [facility] [address] [place], [place] [address] [phone] 01/20/2023

## 2023-01-26 ENCOUNTER — Encounter: Payer: Self-pay | Admitting: Family Medicine

## 2023-02-06 ENCOUNTER — Ambulatory Visit: Payer: Commercial Managed Care - PPO | Admitting: Family Medicine

## 2023-02-13 ENCOUNTER — Ambulatory Visit: Payer: Commercial Managed Care - PPO | Admitting: Family Medicine

## 2023-02-18 ENCOUNTER — Other Ambulatory Visit (HOSPITAL_COMMUNITY): Payer: Self-pay

## 2023-02-20 ENCOUNTER — Other Ambulatory Visit (INDEPENDENT_AMBULATORY_CARE_PROVIDER_SITE_OTHER): Payer: Commercial Managed Care - PPO

## 2023-02-20 ENCOUNTER — Other Ambulatory Visit: Payer: Self-pay

## 2023-02-20 ENCOUNTER — Encounter: Payer: Self-pay | Admitting: Sports Medicine

## 2023-02-20 ENCOUNTER — Ambulatory Visit: Payer: Commercial Managed Care - PPO | Admitting: Sports Medicine

## 2023-02-20 ENCOUNTER — Other Ambulatory Visit (HOSPITAL_COMMUNITY): Payer: Self-pay

## 2023-02-20 DIAGNOSIS — Z9889 Other specified postprocedural states: Secondary | ICD-10-CM

## 2023-02-20 DIAGNOSIS — M5136 Other intervertebral disc degeneration, lumbar region: Secondary | ICD-10-CM | POA: Diagnosis not present

## 2023-02-20 DIAGNOSIS — M5441 Lumbago with sciatica, right side: Secondary | ICD-10-CM

## 2023-02-20 DIAGNOSIS — R293 Abnormal posture: Secondary | ICD-10-CM

## 2023-02-20 DIAGNOSIS — M5442 Lumbago with sciatica, left side: Secondary | ICD-10-CM

## 2023-02-20 DIAGNOSIS — G8929 Other chronic pain: Secondary | ICD-10-CM

## 2023-02-20 DIAGNOSIS — M542 Cervicalgia: Secondary | ICD-10-CM | POA: Diagnosis not present

## 2023-02-20 MED ORDER — MELOXICAM 15 MG PO TABS
15.0000 mg | ORAL_TABLET | Freq: Every day | ORAL | 1 refills | Status: DC
  Filled 2023-02-20: qty 30, 30d supply, fill #0

## 2023-02-20 NOTE — Progress Notes (Signed)
Neck pain; 5 years Getting worse Does have radicular pain down arms Ibuprofen/gabapentin for pain  Low back pain; years of pain History of surgery by Dr. Dutch Quint in 2018 Bilateral leg radiculopathy   States she has MRI of neck in 2023 Low back MRI years ago  Patient was instructed in 10 minutes of therapeutic exercises for neck/low back to improve strength, ROM and function according to my instructions and plan of care by a Certified Athletic Trainer during the office visit. A customized handout was provided and demonstration of proper technique shown and discussed. Patient did perform exercises and demonstrate understanding through teachback.  All questions discussed and answered.

## 2023-02-20 NOTE — Progress Notes (Signed)
[name] - 42 y.o. female MRN [id number]  Date of birth: 1981-10-13  Office Visit Note: Visit Date: 02/20/2023 PCP: Angel Earing, FNP Referred by: Angel Earing, FNP  Subjective: Chief Complaint  Patient presents with   Lower Back - Pain   Neck - Pain   HPI: [name] is a very pleasant 42 y.o. female who presents today for chronic neck pain with b/l radiculopathy; chronic low back pain. 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  [name] has had cervical neck pain for about 5 years.  She will have intermittent radicular symptoms that go down both of the arms, no specific fingers.  She is managed on gabapentin 300 mg twice daily as needed.  Also takes ibuprofen which somewhat helps.  Had a recent cervical MRI on 03/10/2021 with very small disc protrusions but no stenosis or neural impingement.  She does work in Art therapist and will notice the pain will be worse with prolonged sitting or repetitive activity.  Low back pain -chronic pain as well.  She did have surgical discectomy performed by Dr. Dutch Quint in 2018, sounds like this was at the L5-S1 level.  This helped for a little bit but her symptoms returned.  She has had multiple lumbar epidurals, the last one was a year ago.  These helped but her symptoms then returned.  She feels a catching sensation when she goes from flexion and extension.  She is managed on Prozac 40 mg daily.  Pertinent ROS were reviewed with the patient and found to be negative unless otherwise specified above in HPI.   Assessment & Plan: Visit Diagnoses:  1. Cervical pain   2. Chronic midline low back pain with bilateral sciatica   3. Hx of lumbar discectomy   4. DDD (degenerative disc disease), lumbar   5. Posture abnormality    Plan: Discussed with [name] today the nature of her cervical as well as her lumbar pain and intermittent radicular symptoms.  I did review her cervical MRI and she does have a very small disc  protrusions but no neural impingement or findings to explain her upper extremity radicular symptoms.  She does have quite excessive forward head mentation and shoulder protraction which is putting a lot of pressure on the cervical roots.  I do think that most of her pain is likely from this abnormal posture.  Discussed formal therapy versus home therapy.  Did have my athletic trainer reviewed cervical isometric exercises with her today.  Also discussed postural changes at work.  In terms of her low back pain, she has some mild DJD at the L5-S1 level which is where her prior discectomy was located.  We will get her started on home exercises for Williams flexion based exercises, these were reviewed with her today.  For both of her ailments, we will start her on an anti-inflammatory with meloxicam 15 mg once daily.  She may continue gabapentin as needed for any nerve related symptoms.  I did discuss with her talking with her psychiatrist to see if possibly she would have benefit transitioning from Prozac to Cymbalta.  I do think that if we can improve her posture and maximize her musclar chain stability, that her symptoms will largely improved.  We will follow-up in about 6 weeks if she is not improving.  Discussed possible OMT and/or chiropractic care in the future as well.  Follow-up: Return in about 6 weeks (around 04/03/2023), or if symptoms  worsen or fail to improve.   Meds & Orders:  Meds ordered this encounter  Medications   meloxicam (MOBIC) 15 MG tablet    Sig: Take 1 tablet (15 mg total) by mouth daily.    Dispense:  30 tablet    Refill:  1    Orders Placed This Encounter  Procedures   XR Cervical Spine 2 or 3 views   XR Lumbar Spine Complete     Procedures: No procedures performed      Clinical History: No specialty comments available.  She reports that she has been smoking cigarettes. She has been smoking an average of .5 packs per day. She has never used smokeless tobacco. No  results for input(s): "HGBA1C", "LABURIC" in the last 8760 hours.  Objective:    Physical Exam  Gen: Well-appearing, in no acute distress; non-toxic CV:  Well-perfused. Warm.  Resp: Breathing unlabored on room air; no wheezing. Psych: Fluid speech in conversation; appropriate affect; normal thought process Neuro: Sensation intact throughout. No gross coordination deficits.   Ortho Exam - Cervical: Forward cervical nutation.  There is significant hypertonicity of bilateral paraspinal cervical musculature and trapezius musculature, left greater than right.  Associated trigger point in the left mid belly of the trapezius.  There is about 10 degrees less of both left and right cervical rotation secondary to muscle tightness.  There is 2+ DTR's for biceps, triceps and brachial radialis.  Full grip strength.  - Lumbar: Positive TTP laterally in the L4-L5 region, associated nodules in the right lumbar paraspinal musculature, likely indicative of small lipomas, nontender to this location.  There is pain with endrange extension, negative modified slump's testing.  5/5 strength of bilateral lower extremities.  2+ Achilles and patellar DTRs bilaterally.  Imaging:  XR Lumbar Spine Complete Complete lumbar series x-rays including AP, lateral and flexion/extension  views were ordered and reviewed by myself.  No significant scoliosis.   There is some mild DJD with intervertebral disc space narrowing between  L5-S1 with likely prior history of discectomy here.  Remainder of  intervertebral disc spaces is well-maintained without degenerative changes  or acute bony abnormality. XR Cervical Spine 2 or 3 views 3 views of the cervical spine including AP, lateral and flexion/extension  views were ordered and reviewed by myself.  X-rays demonstrate no  significant scoliosis, there is no anterior/retrograde listhesis.  No  significant DJD.   Narrative & Impression  CLINICAL DATA:  Neck pain with bilateral  arm pain   EXAM: MRI CERVICAL SPINE WITHOUT CONTRAST   TECHNIQUE: Multiplanar, multisequence MR imaging of the cervical spine was performed. No intravenous contrast was administered.   COMPARISON:  MRI cervical spine 08/18/2019   FINDINGS: Alignment: Normal   Vertebrae: Normal bone marrow.  Negative for fracture or mass   Cord: Normal signal and morphology   Posterior Fossa, vertebral arteries, paraspinal tissues: Negative   Disc levels:   C2-3: Negative   C3-4: Negative   C4-5: Minimal left paracentral disc protrusion. This is touching the ventral surface of the cord. No significant spinal or foraminal stenosis   C5-6: Small central disc protrusion with slight progression. Mild flattening of the ventral surface of the cord. Neural foramina patent bilaterally   C6-7: Small central disc protrusion.  Negative for stenosis   C7-T1: Negative   IMPRESSION: Minimal left paracentral disc protrusion C4-5 without stenosis   Small central disc protrusion C5-6 with slight progression. Mild cord flattening.   Small central disc protrusion C6-7 without stenosis.  Electronically Signed   By: Marlan Palau M.D.   On: 03/11/2022 11:56    Past Medical/Family/Surgical/Social History: Medications & Allergies reviewed per EMR, new medications updated. Patient Active Problem List   Diagnosis Date Noted   Nausea without vomiting 11/26/2021   Elevated LFTs 11/26/2021   Mixed incontinence 03/17/2021   Hyperlipidemia 08/17/2020   ADD (attention deficit disorder) 01/29/2016   Migraines 09/18/2015   Lumbar back pain with radiculopathy affecting right lower extremity 07/31/2015   HSIL (high grade squamous intraepithelial lesion) on Pap smear of cervix 08/22/2013   Bipolar I disorder (HCC) 08/22/2013   Bulging lumbar disc 08/17/2012   Past Medical History:  Diagnosis Date   Back pain    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, nerve impingment    Bipolar 1 disorder, mixed (HCC)     Depression    Hair pulling    HSIL on Pap smear of cervix 2013   GYN- [facility]   Migraines    Family History  Problem Relation Age of Onset   Heart disease Mother    COPD Mother    Lung cancer Mother    Alcohol abuse Father    Bipolar disorder Father    Prostate cancer Father    Melanoma Paternal Grandmother    Past Surgical History:  Procedure Laterality Date   APPENDECTOMY  2019   CHOLECYSTECTOMY     LAPAROSCOPIC APPENDECTOMY N/A 05/03/2018   Procedure: APPENDECTOMY LAPAROSCOPIC;  Surgeon: Gaynelle Adu, MD;  Location: [facility] OR;  Service: General;  Laterality: N/A;   TUBAL LIGATION     Social History   Occupational History   Not on file  Tobacco Use   Smoking status: Some Days    Packs/day: .5    Types: Cigarettes    Last attempt to quit: 06/08/2019    Years since quitting: 3.7   Smokeless tobacco: Never  Vaping Use   Vaping Use: Some days  Substance and Sexual Activity   Alcohol use: Yes    Alcohol/week: 0.0 standard drinks of alcohol    Comment: occ   Drug use: No   Sexual activity: Yes    Birth control/protection: Surgical    Comment: tubal

## 2023-02-27 DIAGNOSIS — F429 Obsessive-compulsive disorder, unspecified: Secondary | ICD-10-CM | POA: Diagnosis not present

## 2023-02-27 DIAGNOSIS — F411 Generalized anxiety disorder: Secondary | ICD-10-CM | POA: Diagnosis not present

## 2023-02-27 DIAGNOSIS — F902 Attention-deficit hyperactivity disorder, combined type: Secondary | ICD-10-CM | POA: Diagnosis not present

## 2023-02-27 DIAGNOSIS — F331 Major depressive disorder, recurrent, moderate: Secondary | ICD-10-CM | POA: Diagnosis not present

## 2023-02-28 ENCOUNTER — Other Ambulatory Visit (HOSPITAL_COMMUNITY): Payer: Self-pay

## 2023-02-28 MED ORDER — AMPHETAMINE-DEXTROAMPHETAMINE 20 MG PO TABS
20.0000 mg | ORAL_TABLET | Freq: Two times a day (BID) | ORAL | 0 refills | Status: DC
  Filled 2023-03-21: qty 60, 30d supply, fill #0

## 2023-02-28 MED ORDER — AMPHETAMINE-DEXTROAMPHETAMINE 20 MG PO TABS
20.0000 mg | ORAL_TABLET | Freq: Two times a day (BID) | ORAL | 0 refills | Status: DC
  Filled 2023-05-21 (×2): qty 60, 30d supply, fill #0

## 2023-02-28 MED ORDER — AMPHETAMINE-DEXTROAMPHETAMINE 20 MG PO TABS
20.0000 mg | ORAL_TABLET | Freq: Two times a day (BID) | ORAL | 0 refills | Status: DC
  Filled 2023-04-20: qty 60, 30d supply, fill #0

## 2023-02-28 MED ORDER — BENZTROPINE MESYLATE 0.5 MG PO TABS
0.5000 mg | ORAL_TABLET | Freq: Every day | ORAL | 0 refills | Status: DC
  Filled 2023-02-28 – 2023-03-02 (×2): qty 90, 90d supply, fill #0

## 2023-02-28 MED ORDER — ARIPIPRAZOLE 2 MG PO TABS
2.0000 mg | ORAL_TABLET | Freq: Every day | ORAL | 0 refills | Status: DC
  Filled 2023-02-28 – 2023-03-02 (×4): qty 90, 90d supply, fill #0

## 2023-02-28 MED ORDER — ALPRAZOLAM 0.5 MG PO TABS
0.2500 mg | ORAL_TABLET | Freq: Every day | ORAL | 2 refills | Status: DC | PRN
  Filled 2023-02-28 – 2023-03-02 (×4): qty 30, 30d supply, fill #0

## 2023-02-28 MED ORDER — BUPROPION HCL ER (XL) 150 MG PO TB24
150.0000 mg | ORAL_TABLET | Freq: Every day | ORAL | 0 refills | Status: DC
  Filled 2023-02-28 – 2023-03-02 (×4): qty 90, 90d supply, fill #0

## 2023-02-28 MED ORDER — DULOXETINE HCL 60 MG PO CPEP
60.0000 mg | ORAL_CAPSULE | Freq: Every day | ORAL | 0 refills | Status: DC
  Filled 2023-02-28 – 2023-03-02 (×4): qty 90, 90d supply, fill #0

## 2023-03-02 ENCOUNTER — Other Ambulatory Visit: Payer: Self-pay

## 2023-03-02 ENCOUNTER — Other Ambulatory Visit (HOSPITAL_COMMUNITY): Payer: Self-pay

## 2023-03-03 ENCOUNTER — Other Ambulatory Visit: Payer: Self-pay

## 2023-03-04 ENCOUNTER — Other Ambulatory Visit (HOSPITAL_BASED_OUTPATIENT_CLINIC_OR_DEPARTMENT_OTHER): Payer: Self-pay

## 2023-03-21 ENCOUNTER — Other Ambulatory Visit (HOSPITAL_COMMUNITY): Payer: Self-pay

## 2023-04-02 ENCOUNTER — Encounter: Payer: Self-pay | Admitting: Family Medicine

## 2023-04-02 ENCOUNTER — Ambulatory Visit: Payer: Commercial Managed Care - PPO | Admitting: Family Medicine

## 2023-04-02 VITALS — BP 114/83 | HR 105 | Temp 97.5°F | Ht 64.0 in | Wt 165.0 lb

## 2023-04-02 DIAGNOSIS — A689 Relapsing fever, unspecified: Secondary | ICD-10-CM | POA: Diagnosis not present

## 2023-04-02 DIAGNOSIS — G8929 Other chronic pain: Secondary | ICD-10-CM

## 2023-04-02 DIAGNOSIS — M2550 Pain in unspecified joint: Secondary | ICD-10-CM

## 2023-04-02 LAB — ANA COMPREHENSIVE PANEL

## 2023-04-02 LAB — CMP14+EGFR
Albumin: 4.2 g/dL (ref 3.9–4.9)
Bilirubin Total: 0.2 mg/dL (ref 0.0–1.2)
Creatinine, Ser: 0.82 mg/dL (ref 0.57–1.00)
Total Protein: 7 g/dL (ref 6.0–8.5)
eGFR: 93 mL/min/{1.73_m2} (ref >59)

## 2023-04-02 LAB — URIC ACID

## 2023-04-02 LAB — CBC WITH DIFFERENTIAL/PLATELET: RBC: 5.24 x10E6/uL (ref 3.77–5.28)

## 2023-04-02 NOTE — Progress Notes (Signed)
Acute Office Visit  Subjective:     Patient ID: [name], female    DOB: 11/11/1980, 42 y.o.   MRN: [id number]  Chief Complaint  Patient presents with  . Follow-up    want labs to check for inflammatory arthritis. Bilateral jaw pain that is new in addition to joint pain in neck back and now hands. Run temp around 100 most evenings Started 1 month ago      HPI Patient is in today for joint pain. She reprots chronic neck and back pain for years. She is currently seeing ortho for her neck and back pain. She is currently taking mobic with improvement. She has a history of lumbar surgery in 2018- discectomy.   For the last month or so she has had bilateral jaw pain around the TMJ area as well. The jaw pain feels like a pressure. The pain is worse with movement. Her fingers joints have also been hurting for the last month. She denies wrists or elbow pain. She sometime has pain in her left heel. She would like to have labs done to check for autoimmune cause. She feels fatigued all the time. She doesn't feel like her muscles ache, it is mostly in her joints. There is a family hx of RA.  She also reports recurrent fevers for the last few years. She will run a fever every night for a few weeks. Temp will range from 99.5-104. This will happen for a few nights, then normal temp for a few nights, then fever for a few nights again. She does not check this every night and has not kept a log of this. Denies night sweats, weight loss.    ROS As per HPI.     Objective:    BP 114/83   Pulse (!) 105   Temp (!) 97.5 F (36.4 C) (Oral)   Ht 5\' 4"  (1.626 m)   Wt 165 lb (74.8 kg)   LMP 04/01/2023 (Approximate)   SpO2 100%   BMI 28.32 kg/m    Physical Exam Vitals and nursing note reviewed.  Constitutional:      General: She is not in acute distress.    Appearance: Normal appearance. She is not ill-appearing, toxic-appearing or diaphoretic.  HENT:     Head:     Jaw: No trismus,  tenderness, swelling, pain on movement or malocclusion.  Cardiovascular:     Heart sounds: Normal heart sounds. No murmur heard. Pulmonary:     Effort: Pulmonary effort is normal.     Breath sounds: Normal breath sounds.  Musculoskeletal:     Right elbow: Normal.     Left elbow: Normal.     Right wrist: Normal.     Left wrist: Normal.     Right hand: Bony tenderness present. No swelling or deformity. Normal range of motion. Normal strength. Normal sensation. Normal capillary refill.     Left hand: Bony tenderness present. No swelling or deformity. Normal range of motion. Normal strength. Normal sensation. Normal capillary refill.     Cervical back: Neck supple. No rigidity.     Right lower leg: No edema.     Left lower leg: No edema.  Skin:    General: Skin is warm.  Neurological:     General: No focal deficit present.     Mental Status: She is alert and oriented to person, place, and time.  Psychiatric:        Mood and Affect: Mood normal.  Behavior: Behavior normal.    No results found for any visits on 04/02/23.      Assessment & Plan:   [name] was seen today for follow-up.  Diagnoses and all orders for this visit:  Chronic pain of multiple joints -     CBC with Differential/Platelet -     TSH -     T4, Free -     CMP14+EGFR -     ANA Comprehensive Panel -     C-reactive protein -     Sedimentation Rate -     Uric Acid -     Lyme Disease Serology w/Reflex  Fever, recurrent -     CBC with Differential/Platelet -     TSH -     T4, Free -     CMP14+EGFR -     ANA Comprehensive Panel -     C-reactive protein -     Sedimentation Rate -     Uric Acid -     Lyme Disease Serology w/Reflex  Unsure of etiology. Will check labs as above. Will notify patient of results when available and plan of care pending results.    Return in about 6 weeks (around 05/14/2023) for follow up.  The patient indicates understanding of these issues and agrees with the  plan.  Gabriel Earing, FNP

## 2023-04-03 ENCOUNTER — Ambulatory Visit: Payer: Commercial Managed Care - PPO | Admitting: Family Medicine

## 2023-04-03 ENCOUNTER — Other Ambulatory Visit: Payer: Self-pay | Admitting: Family Medicine

## 2023-04-03 DIAGNOSIS — A689 Relapsing fever, unspecified: Secondary | ICD-10-CM

## 2023-04-03 DIAGNOSIS — R899 Unspecified abnormal finding in specimens from other organs, systems and tissues: Secondary | ICD-10-CM

## 2023-04-03 DIAGNOSIS — G8929 Other chronic pain: Secondary | ICD-10-CM

## 2023-04-03 DIAGNOSIS — M2550 Pain in unspecified joint: Secondary | ICD-10-CM

## 2023-04-03 LAB — CMP14+EGFR
ALT: 32 IU/L (ref 0–32)
AST: 24 IU/L (ref 0–40)
Albumin/Globulin Ratio: 1.5
Alkaline Phosphatase: 105 IU/L (ref 44–121)
BUN/Creatinine Ratio: 10 (ref 9–23)
BUN: 8 mg/dL (ref 6–24)
CO2: 24 mmol/L (ref 20–29)
Calcium: 9.5 mg/dL (ref 8.7–10.2)
Chloride: 101 mmol/L (ref 96–106)
Globulin, Total: 2.8 g/dL (ref 1.5–4.5)
Glucose: 70 mg/dL (ref 70–99)
Potassium: 4.3 mmol/L (ref 3.5–5.2)
Sodium: 138 mmol/L (ref 134–144)

## 2023-04-03 LAB — LYME DISEASE SEROLOGY W/REFLEX: Lyme Total Antibody EIA: NEGATIVE

## 2023-04-03 LAB — CBC WITH DIFFERENTIAL/PLATELET
Basophils Absolute: 0 10*3/uL (ref 0.0–0.2)
Basos: 1 %
EOS (ABSOLUTE): 0.2 10*3/uL (ref 0.0–0.4)
Eos: 4 %
Hematocrit: 42.8 % (ref 34.0–46.6)
Hemoglobin: 14 g/dL (ref 11.1–15.9)
Immature Grans (Abs): 0 10*3/uL (ref 0.0–0.1)
Immature Granulocytes: 0 %
Lymphocytes Absolute: 1.3 10*3/uL (ref 0.7–3.1)
Lymphs: 28 %
MCH: 26.7 pg (ref 26.6–33.0)
MCHC: 32.7 g/dL (ref 31.5–35.7)
MCV: 82 fL (ref 79–97)
Monocytes Absolute: 0.4 10*3/uL (ref 0.1–0.9)
Monocytes: 9 %
Neutrophils Absolute: 2.7 10*3/uL (ref 1.4–7.0)
Neutrophils: 58 %
Platelets: 254 10*3/uL (ref 150–450)
RDW: 14.4 % (ref 11.7–15.4)
WBC: 4.6 10*3/uL (ref 3.4–10.8)

## 2023-04-03 LAB — ANA COMPREHENSIVE PANEL
Anti JO-1: <0.2 AI (ref 0.0–0.9)
Chromatin Ab SerPl-aCnc: <0.2 AI (ref 0.0–0.9)
ENA RNP Ab: 0.2 AI (ref 0.0–0.9)
ENA SM Ab Ser-aCnc: <0.2 AI (ref 0.0–0.9)
ENA SSA (RO) Ab: <0.2 AI (ref 0.0–0.9)
ENA SSB (LA) Ab: <0.2 AI (ref 0.0–0.9)
dsDNA Ab: 10 IU/mL — ABNORMAL HIGH (ref 0–9)

## 2023-04-03 LAB — SEDIMENTATION RATE: Sed Rate: 2 mm/hr (ref 0–32)

## 2023-04-03 LAB — TSH: TSH: 2.21 u[IU]/mL (ref 0.450–4.500)

## 2023-04-03 LAB — T4, FREE: Free T4: 1.35 ng/dL (ref 0.82–1.77)

## 2023-04-03 LAB — C-REACTIVE PROTEIN: CRP: <1 mg/L (ref 0–10)

## 2023-04-07 ENCOUNTER — Telehealth: Payer: Commercial Managed Care - PPO | Admitting: Family Medicine

## 2023-04-07 DIAGNOSIS — R3989 Other symptoms and signs involving the genitourinary system: Secondary | ICD-10-CM

## 2023-04-07 MED ORDER — CEPHALEXIN 500 MG PO CAPS
500.0000 mg | ORAL_CAPSULE | Freq: Two times a day (BID) | ORAL | 0 refills | Status: AC
Start: 2023-04-07 — End: 2023-04-14

## 2023-04-07 NOTE — Progress Notes (Signed)

## 2023-04-08 NOTE — Progress Notes (Unsigned)
Office Visit Note  Patient: [name]             Date of Birth: 07/11/1981           MRN: [id number]             PCP: Gabriel Earing, FNP Referring: Gabriel Earing, FNP Visit Date: 04/09/2023 Occupation: Medical office lead  Subjective:  New Patient (Initial Visit) (Patient states she saw orthopedics a month ago and they said her neck is stiff. Patient states during the night it feels like her shins throb and itch. Patient states she is having trouble with her hands when opening and pressing on things. Patient states her jaw hurts as well. Patient states she runs a fever every evening. )   History of Present Illness: [name] is a 42 y.o. female here for evaluation of positive double-stranded DNA test are all checked in association with multiple chronic symptoms including persistent nighttime low-grade fevers, fatigue, joint pain in multiple areas, and intermittent skin rashes.  She reports symptoms are ongoing for at least the past 5 years and on record review has had some investigation going back to at least 2017.  Workup around that time had some suspicion for chronic EBV reactivation due to her abnormal lab results but not confirmed and never on any specific treatment.  Symptoms have come and gone intermittently usually for weeks or months at a time with low-grade fevers at night and severe fatigue limiting any at home activities.  She wakes each morning with resolution of these fevers. Joint pain in multiple areas was previously found to have bulging disc at L5 treated with discectomy in 2014.  Subsequently has some low back pain intermittently without severe radiculopathy.  Also gets joint pain in several other areas including her jaws, hands, and in her neck and shoulders.  Evaluation at orthopedics clinic was pretty unremarkable for any structural problem in the cervical spine.  Also recently having pain along the medial side of both knees just distal to the joints.  Does not  see a lot of visible swelling on most days.  Notices pedal edema on some days with pitting but is not particularly more painful at that time. She previously saw allergy clinic for skin testing after developing facial skin rashes suspected as environmental allergy provoked.  Intermittently has rashes with itchy raised red bumps most often on her forearms and upper legs.  Notices these after sun exposure.  Rash lasts for about a week and becomes painful if excoriated.  Taking Benadryl helps partially with the itching but does not resolve the rash any faster. She occasionally notices some small nodule on the left side of the neck usually not particularly painful.  Also notices some firm nodules in the low back. Has an aunt with RA. No known 1st degree relatives with autoimmune disorder. Often feels poorly but does not have particularly frequent infections requiring antibiotics.  Activities of Daily Living:  Patient reports morning stiffness for 90 minutes.   Patient Reports nocturnal pain.  Difficulty dressing/grooming: Denies Difficulty climbing stairs: Denies Difficulty getting out of chair: Denies Difficulty using hands for taps, buttons, cutlery, and/or writing: Reports  Review of Systems  Constitutional:  Positive for fatigue.  HENT:  Positive for mouth dryness. Negative for mouth sores.   Eyes:  Positive for dryness.  Respiratory:  Negative for shortness of breath.   Cardiovascular:  Positive for palpitations. Negative for chest pain.  Gastrointestinal:  Positive for constipation.  Negative for blood in stool and diarrhea.  Endocrine: Positive for increased urination.  Genitourinary:  Positive for involuntary urination.  Musculoskeletal:  Positive for joint pain, gait problem, joint pain, joint swelling, myalgias, muscle weakness, morning stiffness, muscle tenderness and myalgias.  Skin:  Positive for rash and sensitivity to sunlight. Negative for color change and hair loss.   Allergic/Immunologic: Negative for susceptible to infections.  Neurological:  Positive for dizziness and headaches.  Hematological:  Negative for swollen glands.  Psychiatric/Behavioral:  Positive for depressed mood and sleep disturbance. The patient is nervous/anxious.     PMFS History:  Patient Active Problem List   Diagnosis Date Noted   Recurrent fever of unknown cause 04/09/2023   Multiple skin nodules 04/09/2023   Positive double stranded DNA antibody test 04/09/2023   Rash and other nonspecific skin eruption 04/09/2023   Nausea without vomiting 11/26/2021   Elevated LFTs 11/26/2021   Mixed incontinence 03/17/2021   Hyperlipidemia 08/17/2020   ADD (attention deficit disorder) 01/29/2016   Migraines 09/18/2015   Lumbar back pain with radiculopathy affecting right lower extremity 07/31/2015   HSIL (high grade squamous intraepithelial lesion) on Pap smear of cervix 08/22/2013   Bipolar I disorder (HCC) 08/22/2013   Bulging lumbar disc 08/17/2012    Past Medical History:  Diagnosis Date   Back pain    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, nerve impingment    Bipolar 1 disorder, mixed (HCC)    Depression    Hair pulling    HSIL on Pap smear of cervix 2013   GYN- [facility]   Migraines     Family History  Problem Relation Age of Onset   Heart disease Mother    COPD Mother    Lung cancer Mother    Osteoporosis Mother    Alcohol abuse Father    Bipolar disorder Father    Prostate cancer Father    Melanoma Paternal Grandmother    Arthritis/Rheumatoid Maternal Aunt    Past Surgical History:  Procedure Laterality Date   APPENDECTOMY  2019   CHOLECYSTECTOMY     LAPAROSCOPIC APPENDECTOMY N/A 05/03/2018   Procedure: APPENDECTOMY LAPAROSCOPIC;  Surgeon: Gaynelle Adu, MD;  Location: [facility] OR;  Service: General;  Laterality: N/A;   TUBAL LIGATION     Social History   Social History Narrative   Not on file   Immunization History  Administered Date(s) Administered    Influenza,inj,Quad PF,6+ Mos 07/16/2017   Influenza-Unspecified 07/16/2017, 07/14/2019, 08/08/2020, 08/08/2021   Moderna Sars-Covid-2 Vaccination 11/15/2019, 12/16/2019   Tdap 02/19/2018     Objective: Vital Signs: BP 107/76 (BP Location: Right Arm, Patient Position: Sitting, Cuff Size: Normal)   Pulse (!) 101   Resp 16   Ht 5' 4.25" (1.632 m)   Wt 164 lb (74.4 kg)   LMP 04/01/2023 (Approximate)   BMI 27.93 kg/m    Physical Exam HENT:     Mouth/Throat:     Mouth: Mucous membranes are moist.     Pharynx: Oropharynx is clear.  Eyes:     Conjunctiva/sclera: Conjunctivae normal.  Cardiovascular:     Rate and Rhythm: Regular rhythm. Tachycardia present.  Pulmonary:     Effort: Pulmonary effort is normal.     Breath sounds: Normal breath sounds.  Musculoskeletal:     Right lower leg: No edema.     Left lower leg: No edema.  Lymphadenopathy:     Cervical: No cervical adenopathy.  Skin:    General: Skin is warm and dry.  Findings: No rash.  Neurological:     General: No focal deficit present.     Mental Status: She is alert.  Psychiatric:        Mood and Affect: Mood normal.      Musculoskeletal Exam:  Neck full ROM no tenderness Shoulders full ROM no tenderness or swelling Elbows full ROM no tenderness or swelling Wrists full ROM no tenderness or swelling Fingers full ROM PIP joint tenderness, no palpable swelling Knees full ROM no tenderness or swelling Ankles full ROM no tenderness or swelling   Investigation: No additional findings.  Imaging: No results found.  Recent Labs: Lab Results  Component Value Date   WBC 4.6 04/02/2023   HGB 14.0 04/02/2023   PLT 254 04/02/2023   NA 138 04/02/2023   K 4.3 04/02/2023   CL 101 04/02/2023   CO2 24 04/02/2023   GLUCOSE 70 04/02/2023   BUN 8 04/02/2023   CREATININE 0.82 04/02/2023   BILITOT 0.2 04/02/2023   ALKPHOS 105 04/02/2023   AST 24 04/02/2023   ALT 32 04/02/2023   PROT 7.0 04/02/2023   ALBUMIN  4.2 04/02/2023   CALCIUM 9.5 04/02/2023   GFRAA 117 04/04/2019    Speciality Comments: No specialty comments available.  Procedures:  No procedures performed Allergies: Patient has no known allergies.   Assessment / Plan:     Visit Diagnoses: Recurrent fever of unknown cause - Plan: Anti-DNAse B antibody, IgG, IgA, IgM, Serum protein electrophoresis with reflex, IgD  Persistent low-grade fevers these do not seem to correlate very closely with her other described symptoms such as intermittent rash.  Has had a pretty extensive workup dating back at least 6 or 7 years mostly unremarkable.  EBV reactivation seems atypical to account for such prolonged symptoms especially without any more localized complaint.  Skin nodular changes probably benign do not seem strongly suggestive for panniculitis or chronic granulomatous disorder.  Pattern does not really fit with any periodic fever syndrome.  Smoldering malignancy or infection does not seem likely without progression over years. Will check quantitative immunoglobulins for possible immune activation or immunodeficiency.  Will also check a DNase B antibody titer.  Multiple skin nodules Rash and other nonspecific skin eruption - Plan: Chronic Urticaria index panel  Reviewed phone pictures of previous erythematous rash looks like discrete nodules not in any clear distribution with no associated induration.  Not typical for subacute cutaneous lupus erythematosus pattern of photosensitive rash.  Also did not appear very typical for urticarial vasculitis and are described as more pruritic than painful.  Will check CU index panel.  Positive double stranded DNA antibody test - Plan: C3 and C4  Marginally positive titer at 10 clinically does not demonstrate specific criteria for systemic lupus.  Will check serum complements today as well for assessment but lower pretest suspicion.  Orders: Orders Placed This Encounter  Procedures   Anti-DNAse B antibody    IgG, IgA, IgM   Serum protein electrophoresis with reflex   IgD   C3 and C4   Chronic Urticaria index panel   No orders of the defined types were placed in this encounter.    Follow-Up Instructions: No follow-ups on file.   Fuller Plan, MD  Note - This record has been created using [employer].  Chart creation errors have been sought, but may not always  have been located. Such creation errors do not reflect on  the standard of medical care.

## 2023-04-09 ENCOUNTER — Ambulatory Visit: Payer: Commercial Managed Care - PPO | Attending: Internal Medicine | Admitting: Internal Medicine

## 2023-04-09 ENCOUNTER — Encounter: Payer: Self-pay | Admitting: Internal Medicine

## 2023-04-09 VITALS — BP 107/76 | HR 101 | Resp 16 | Ht 64.25 in | Wt 164.0 lb

## 2023-04-09 DIAGNOSIS — R509 Fever, unspecified: Secondary | ICD-10-CM

## 2023-04-09 DIAGNOSIS — R229 Localized swelling, mass and lump, unspecified: Secondary | ICD-10-CM | POA: Diagnosis not present

## 2023-04-09 DIAGNOSIS — R21 Rash and other nonspecific skin eruption: Secondary | ICD-10-CM | POA: Diagnosis not present

## 2023-04-09 DIAGNOSIS — R768 Other specified abnormal immunological findings in serum: Secondary | ICD-10-CM | POA: Diagnosis not present

## 2023-04-10 LAB — IGG, IGA, IGM: IgM, Serum: 214 mg/dL (ref 50–300)

## 2023-04-12 LAB — IGG, IGA, IGM: Immunoglobulin A: 324 mg/dL — ABNORMAL HIGH (ref 47–310)

## 2023-04-13 ENCOUNTER — Encounter: Payer: Self-pay | Admitting: Family Medicine

## 2023-04-13 LAB — PROTEIN ELECTROPHORESIS, SERUM, WITH REFLEX: Alpha 2: 0.6 g/dL (ref 0.5–0.9)

## 2023-04-14 ENCOUNTER — Encounter: Payer: Self-pay | Admitting: Internal Medicine

## 2023-04-15 LAB — PROTEIN ELECTROPHORESIS, SERUM, WITH REFLEX: Beta Globulin: 0.5 g/dL (ref 0.4–0.6)

## 2023-04-15 LAB — IGG, IGA, IGM: IgG (Immunoglobin G), Serum: 1384 mg/dL (ref 600–1640)

## 2023-04-20 ENCOUNTER — Other Ambulatory Visit (HOSPITAL_COMMUNITY): Payer: Self-pay

## 2023-04-20 ENCOUNTER — Other Ambulatory Visit: Payer: Self-pay

## 2023-04-22 LAB — ANTI-DNASE B ANTIBODY: Anti-DNAse-B: <95 U/mL (ref <301)

## 2023-04-22 LAB — C3 AND C4
C3 Complement: 112 mg/dL (ref 83–193)
C4 Complement: 16 mg/dL (ref 15–57)

## 2023-04-22 LAB — CP CHRONIC URTICARIA INDEX PANEL
Histamine Release: <16 % (ref <16)
TSH: 1.56 mIU/L (ref 0.40–4.50)
Thyroglobulin Ab: <1 IU/mL (ref <=1)
Thyroperoxidase Ab SerPl-aCnc: 4 IU/mL (ref <9)

## 2023-04-22 LAB — PROTEIN ELECTROPHORESIS, SERUM, WITH REFLEX
Albumin ELP: 4.1 g/dL (ref 3.8–4.8)
Alpha 1: 0.2 g/dL (ref 0.2–0.3)
Beta 2: 0.4 g/dL (ref 0.2–0.5)
Gamma Globulin: 1.3 g/dL (ref 0.8–1.7)
Total Protein: 7.2 g/dL (ref 6.1–8.1)

## 2023-04-22 LAB — IGD: IgD: 72 mg/L (ref <179)

## 2023-04-24 NOTE — Telephone Encounter (Signed)
Patient called in regards to lab results, patient is requesting a phone call or mychart message. I advised patient that Dr. Dimple Casey is out of the office until Monday. Patient verbalized understanding.

## 2023-04-27 NOTE — Telephone Encounter (Signed)
Please refer to PM&R maybe Dr. Carlis Abbott as a first choice.

## 2023-04-28 ENCOUNTER — Other Ambulatory Visit: Payer: Self-pay | Admitting: *Deleted

## 2023-04-28 DIAGNOSIS — G9332 Myalgic encephalomyelitis/chronic fatigue syndrome: Secondary | ICD-10-CM | POA: Insufficient documentation

## 2023-04-28 NOTE — Addendum Note (Signed)
Addended by: Fuller Plan on: 04/28/2023 11:58 AM   Modules accepted: Orders

## 2023-04-29 ENCOUNTER — Other Ambulatory Visit: Payer: Self-pay | Admitting: Oncology

## 2023-04-29 DIAGNOSIS — Z006 Encounter for examination for normal comparison and control in clinical research program: Secondary | ICD-10-CM

## 2023-05-01 ENCOUNTER — Other Ambulatory Visit (HOSPITAL_COMMUNITY)
Admission: RE | Admit: 2023-05-01 | Discharge: 2023-05-01 | Disposition: A | Discharge status: Home / Self Care | Payer: Self-pay | Source: Ambulatory Visit | Attending: *Deleted | Admitting: *Deleted

## 2023-05-01 DIAGNOSIS — Z006 Encounter for examination for normal comparison and control in clinical research program: Secondary | ICD-10-CM

## 2023-05-16 ENCOUNTER — Telehealth: Payer: Commercial Managed Care - PPO | Admitting: Physician Assistant

## 2023-05-16 DIAGNOSIS — M5442 Lumbago with sciatica, left side: Secondary | ICD-10-CM

## 2023-05-16 MED ORDER — METHYLPREDNISOLONE 4 MG PO TBPK
ORAL_TABLET | ORAL | 0 refills | Status: DC
Start: 2023-05-16 — End: 2023-06-04

## 2023-05-16 MED ORDER — CYCLOBENZAPRINE HCL 10 MG PO TABS
5.0000 mg | ORAL_TABLET | Freq: Three times a day (TID) | ORAL | 0 refills | Status: DC | PRN
Start: 2023-05-16 — End: 2023-08-17

## 2023-05-16 NOTE — Progress Notes (Signed)
We are sorry that you are not feeling well.  Here is how we plan to help!  Based on what you have shared with me it looks like you mostly have acute back pain.  Acute back pain is defined as musculoskeletal pain that can resolve in 1-3 weeks with conservative treatment.  I have prescribed Medrol 6 day dose pack, a steroid anti-inflammatory, as well as Flexeril 10 mg every eight hours as needed which is a muscle relaxer  Some patients experience stomach irritation or in increased heartburn with anti-inflammatory drugs.  Please keep in mind that muscle relaxer's can cause fatigue and should not be taken while at work or driving.  Back pain is very common.  The pain often gets better over time.  The cause of back pain is usually not dangerous.  Most people can learn to manage their back pain on their own.  Home Care Stay active.  Start with short walks on flat ground if you can.  Try to walk farther each day. Do not sit, drive or stand in one place for more than 30 minutes.  Do not stay in bed. Do not avoid exercise or work.  Activity can help your back heal faster. Be careful when you bend or lift an object.  Bend at your knees, keep the object close to you, and do not twist. Sleep on a firm mattress.  Lie on your side, and bend your knees.  If you lie on your back, put a pillow under your knees. Only take medicines as told by your doctor. Put ice on the injured area. Put ice in a plastic bag Place a towel between your skin and the bag Leave the ice on for 15-20 minutes, 3-4 times a day for the first 2-3 days. 210 After that, you can switch between ice and heat packs. Ask your doctor about back exercises or massage. Avoid feeling anxious or stressed.  Find good ways to deal with stress, such as exercise.  Get Help Right Way If: Your pain does not go away with rest or medicine. Your pain does not go away in 1 week. You have new problems. You do not feel well. The pain spreads into your  legs. You cannot control when you poop (bowel movement) or pee (urinate) You feel sick to your stomach (nauseous) or throw up (vomit) You have belly (abdominal) pain. You feel like you may pass out (faint). If you develop a fever.  Make Sure you: Understand these instructions. Will watch your condition Will get help right away if you are not doing well or get worse.  Your e-visit answers were reviewed by a board certified advanced clinical practitioner to complete your personal care plan.  Depending on the condition, your plan could have included both over the counter or prescription medications.  If there is a problem please reply  once you have received a response from your provider.  Your safety is important to [place].  If you have drug allergies check your prescription carefully.    You can use MyChart to ask questions about today's visit, request a non-urgent call back, or ask for a work or school excuse for 24 hours related to this e-Visit. If it has been greater than 24 hours you will need to follow up with your provider, or enter a new e-Visit to address those concerns.  You will get an e-mail in the next two days asking about your experience.  I hope that your e-visit has been valuable  and will speed your recovery. Thank you for using e-visits.  I have spent 5 minutes in review of e-visit questionnaire, review and updating patient chart, medical decision making and response to patient.   Margaretann Loveless, PA-C

## 2023-05-18 ENCOUNTER — Encounter: Payer: Self-pay | Admitting: Physical Medicine and Rehabilitation

## 2023-05-21 ENCOUNTER — Other Ambulatory Visit (HOSPITAL_COMMUNITY): Payer: Self-pay

## 2023-05-27 ENCOUNTER — Ambulatory Visit: Payer: Commercial Managed Care - PPO | Admitting: Obstetrics & Gynecology

## 2023-05-27 ENCOUNTER — Encounter: Payer: Commercial Managed Care - PPO | Admitting: Orthopedic Surgery

## 2023-06-04 ENCOUNTER — Encounter: Payer: Self-pay | Admitting: Physical Medicine and Rehabilitation

## 2023-06-04 ENCOUNTER — Other Ambulatory Visit: Payer: Self-pay

## 2023-06-04 ENCOUNTER — Encounter
Payer: Commercial Managed Care - PPO | Attending: Physical Medicine and Rehabilitation | Admitting: Physical Medicine and Rehabilitation

## 2023-06-04 ENCOUNTER — Other Ambulatory Visit (HOSPITAL_COMMUNITY): Payer: Self-pay

## 2023-06-04 ENCOUNTER — Encounter: Payer: Self-pay | Admitting: *Deleted

## 2023-06-04 VITALS — BP 117/80 | HR 97 | Ht 64.5 in | Wt 171.8 lb

## 2023-06-04 DIAGNOSIS — M797 Fibromyalgia: Secondary | ICD-10-CM

## 2023-06-04 DIAGNOSIS — M7918 Myalgia, other site: Secondary | ICD-10-CM

## 2023-06-04 DIAGNOSIS — R6884 Jaw pain: Secondary | ICD-10-CM

## 2023-06-04 DIAGNOSIS — M722 Plantar fascial fibromatosis: Secondary | ICD-10-CM

## 2023-06-04 DIAGNOSIS — R200 Anesthesia of skin: Secondary | ICD-10-CM | POA: Diagnosis not present

## 2023-06-04 DIAGNOSIS — M25552 Pain in left hip: Secondary | ICD-10-CM | POA: Diagnosis not present

## 2023-06-04 MED ORDER — NONFORMULARY OR COMPOUNDED ITEM: 1.0000 mg | Freq: Every evening | 3 refills | Status: AC

## 2023-06-04 MED ORDER — FLUOXETINE HCL 20 MG PO CAPS
20.0000 mg | ORAL_CAPSULE | Freq: Every day | ORAL | 3 refills | Status: DC
  Filled 2023-06-04: qty 90, 90d supply, fill #0

## 2023-06-04 MED ORDER — DULOXETINE HCL 60 MG PO CPEP
60.0000 mg | ORAL_CAPSULE | Freq: Every day | ORAL | 3 refills | Status: DC
  Filled 2023-06-04: qty 90, 90d supply, fill #0

## 2023-06-04 NOTE — Patient Instructions (Addendum)
Turmeric with black pepper Feet in epsom salts at night Tart cherry juice at night  Red light therapy  LDN  Turmeric to reduce inflammation--can be used in cooking or taken as a supplement.  Benefits of turmeric:  -Highly anti-inflammatory  -Increases antioxidants  -Improves memory, attention, brain disease  -Lowers risk of heart disease  -May help prevent cancer  -Decreases pain  -Alleviates depression  -Delays aging and decreases risk of chronic disease  -Consume with black pepper to increase absorption    Turmeric Milk Recipe:  1 cup milk  1 tsp turmeric  1 tsp cinnamon  1 tsp grated ginger (optional)  Black pepper (boosts the anti-inflammatory properties of turmeric).  1 tsp honey

## 2023-06-04 NOTE — Addendum Note (Signed)
Addended by: Horton Chin on: 06/04/2023 11:49 AM   Modules accepted: Orders

## 2023-06-04 NOTE — Telephone Encounter (Signed)
Opened in error

## 2023-06-04 NOTE — Progress Notes (Signed)
Subjective:    Patient ID: [name], female    DOB: 02/20/1981, 42 y.o.   MRN: [id number]  HPI [name] is a 42 year old woman who presents to establish care for fatigue, arthritis, and fevers  1) Jaw tightness -started three months ago after a very stressful event  2) Finger numbness -feels like bee stings  3) Left hip pain: -she felt it when playing volleyball with her daughter  4) Plantar fasciitis of left foot  5) Right buttock pain -feels like she is sitting on a knot  Pain Inventory Average Pain 7 Pain Right Now 5 My pain is intermittent, sharp, burning, and aching  In the last 24 hours, has pain interfered with the following? General activity 0 Relation with others 0 Enjoyment of life 0 What TIME of day is your pain at its worst?  Sleep (in general) NA  Pain is worse with: bending and sitting Pain improves with: pacing activities, medication, and TENS Relief from Meds: 3  walk without assistance do you drive?  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  no  Any changes since last visit?  no  has seen a rheumatologist and ruled out lupus    Family History  Problem Relation Age of Onset   Heart disease Mother    COPD Mother    Lung cancer Mother    Osteoporosis Mother    Alcohol abuse Father    Bipolar disorder Father    Prostate cancer Father    Melanoma Paternal Grandmother    Arthritis/Rheumatoid Maternal Aunt    Social History   Socioeconomic History   Marital status: Married    Spouse name: Not on file   Number of children: 3   Years of education: Not on file   Highest education level: Not on file  Occupational History   Not on file  Tobacco Use   Smoking status: Some Days    Current packs/day: 0.00    Types: Cigarettes    Last attempt to quit: 06/08/2019    Years since quitting: 3.9     Passive exposure: Past   Smokeless tobacco: Never  Vaping Use   Vaping status: Some Days  Substance and Sexual Activity   Alcohol use: Yes    Alcohol/week: 0.0 standard drinks of alcohol    Comment: occ   Drug use: No   Sexual activity: Yes    Birth control/protection: Surgical    Comment: tubal  Other Topics Concern   Not on file  Social History Narrative   Not on file   Social Determinants of Health   Financial Resource Strain: Low Risk  (12/11/2021)   Overall Financial Resource Strain (CARDIA)    Difficulty of Paying Living Expenses: Not hard at all  Food Insecurity: No Food Insecurity (12/11/2021)   Hunger Vital Sign    Worried About Running Out of Food in the Last Year: Never true    Ran Out of Food in the Last Year: Never true  Transportation Needs: No Transportation Needs (12/11/2021)   PRAPARE - Transportation    Lack of Transportation (Medical): No    Lack of Transportation (Non-Medical): No  Physical Activity: Sufficiently Active (12/11/2021)   Exercise Vital Sign    Days of Exercise per Week: 5 days    Minutes of Exercise per Session: 30 min  Stress: No Stress  Concern Present (12/11/2021)   [employer] of Occupational Health - Occupational Stress Questionnaire    Feeling of Stress : Only a little  Social Connections: Moderately Integrated (12/11/2021)   Social Connection and Isolation Panel [NHANES]    Frequency of Communication with Friends and Family: More than three times a week    Frequency of Social Gatherings with Friends and Family: More than three times a week    Attends Religious Services: 1 to 4 times per year    Active Member of [employer] or Organizations: No    Attends Banker Meetings: Never    Marital Status: Married   Past Surgical History:  Procedure Laterality Date   APPENDECTOMY  2019   CHOLECYSTECTOMY     LAPAROSCOPIC APPENDECTOMY N/A 05/03/2018   Procedure: APPENDECTOMY LAPAROSCOPIC;  Surgeon: Gaynelle Adu, MD;  [facility]  OR;  Service: General;  Laterality: N/A;   TUBAL LIGATION     Past Medical History:  Diagnosis Date   Back pain    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, nerve impingment    Bipolar 1 disorder, mixed (HCC)    Depression    Hair pulling    HSIL on Pap smear of cervix 2013   GYN- [facility]   Migraines    BP 117/80   Pulse 97   Ht 5' 4.5" (1.638 m)   Wt 171 lb 12.8 oz (77.9 kg)   SpO2 98%   BMI 29.03 kg/m   Opioid Risk Score:   Fall Risk Score:  `1  Depression screen [facility] 2/9     06/04/2023   11:10 AM 04/02/2023    8:23 AM 01/02/2023    1:10 PM 12/26/2022    9:19 AM 12/26/2022    9:18 AM 09/29/2022    8:08 AM 02/14/2022    2:44 PM  Depression screen PHQ 2/9  Decreased Interest 1 1 0  0 1 1  Down, Depressed, Hopeless 0 2 0  0 1 2  PHQ - 2 Score 1 3 0  0 2 3  Altered sleeping 2 0 0 0  1 1  Tired, decreased energy 3 1 0 0  1 1  Change in appetite 1 1 0 0  0 0  Feeling bad or failure about yourself  0 1 0 0  0 1  Trouble concentrating 0 1 0 0  0 0  Moving slowly or fidgety/restless 0 2 0 0  0 0  Suicidal thoughts 0 0 0 0  0 0  PHQ-9 Score 7 9 0   4 6  Difficult doing work/chores Somewhat difficult Somewhat difficult Not difficult at all Not difficult at all  Somewhat difficult Somewhat difficult    Review of Systems  Constitutional:  Positive for chills, diaphoresis, fatigue, fever and unexpected weight change.  HENT: Negative.    Eyes: Negative.   Respiratory: Negative.    Cardiovascular: Negative.   Gastrointestinal:  Positive for constipation.  Endocrine: Negative.   Genitourinary: Negative.   Musculoskeletal:  Positive for arthralgias, back pain and myalgias.  Skin: Negative.   Allergic/Immunologic: Negative.   Neurological:  Positive for tremors and weakness.       Tingling  Hematological: Negative.   Psychiatric/Behavioral:  Positive for dysphoric mood.   All other systems reviewed and are negative.      Objective:   Physical Exam Gen: no distress, normal  appearing HEENT: oral mucosa pink and moist, NCAT Cardio: Reg rate Chest: normal effort, normal rate of breathing Abd:  soft, non-distended Ext: no edema Psych: pleasant, normal affect Skin: intact Neuro: Alert and oriented x3      Assessment & Plan:   1) Right buttock pain: -discussed physical therapy  2) Jaw pain -discussed that this has improved  3) Fibromyalgia -discussed mechanism of action of low dose naltrexone as an opioid receptor antagonist which stimulates your body's production of its own natural endogenous opioids, helping to decrease pain. Discussed that it can also decrease T cell response and thus be helpful in decreasing inflammation, and symptoms of brain fog, fatigue, anxiety, depression, and allergies. Discussed that this medication needs to be compounded at a compounding pharmacy and can more expensive. Discussed that I usually start at 1mg  and if this is not providing enough relief then I titrate upward on a monthly basis.    -continue support groups  4) Left hip pain: -Turmeric to reduce inflammation--can be used in cooking or taken as a supplement.  Benefits of turmeric:  -Highly anti-inflammatory  -Increases antioxidants  -Improves memory, attention, brain disease  -Lowers risk of heart disease  -May help prevent cancer  -Decreases pain  -Alleviates depression  -Delays aging and decreases risk of chronic disease  -Consume with black pepper to increase absorption    Turmeric Milk Recipe:  1 cup milk  1 tsp turmeric  1 tsp cinnamon  1 tsp grated ginger (optional)  Black pepper (boosts the anti-inflammatory properties of turmeric).  1 tsp honey   5) Left plantar fascitis: Red light therapy

## 2023-06-04 NOTE — Addendum Note (Signed)
Addended by: Horton Chin on: 06/04/2023 12:12 PM   Modules accepted: Orders

## 2023-06-10 ENCOUNTER — Other Ambulatory Visit (HOSPITAL_COMMUNITY): Payer: Self-pay | Admitting: Family Medicine

## 2023-06-10 DIAGNOSIS — Z1231 Encounter for screening mammogram for malignant neoplasm of breast: Secondary | ICD-10-CM

## 2023-06-12 ENCOUNTER — Other Ambulatory Visit (HOSPITAL_COMMUNITY): Payer: Self-pay

## 2023-06-12 ENCOUNTER — Other Ambulatory Visit: Payer: Self-pay

## 2023-06-12 DIAGNOSIS — F331 Major depressive disorder, recurrent, moderate: Secondary | ICD-10-CM | POA: Diagnosis not present

## 2023-06-12 DIAGNOSIS — F429 Obsessive-compulsive disorder, unspecified: Secondary | ICD-10-CM | POA: Diagnosis not present

## 2023-06-12 DIAGNOSIS — F902 Attention-deficit hyperactivity disorder, combined type: Secondary | ICD-10-CM | POA: Diagnosis not present

## 2023-06-12 DIAGNOSIS — F411 Generalized anxiety disorder: Secondary | ICD-10-CM | POA: Diagnosis not present

## 2023-06-12 MED ORDER — AMPHETAMINE-DEXTROAMPHETAMINE 30 MG PO TABS
30.0000 mg | ORAL_TABLET | Freq: Two times a day (BID) | ORAL | 0 refills | Status: AC
  Filled 2023-06-19: qty 60, 30d supply, fill #0

## 2023-06-12 MED ORDER — BENZTROPINE MESYLATE 0.5 MG PO TABS
0.5000 mg | ORAL_TABLET | Freq: Every evening | ORAL | 0 refills | Status: DC
  Filled 2023-06-12: qty 90, 90d supply, fill #0

## 2023-06-12 MED ORDER — AMPHETAMINE-DEXTROAMPHETAMINE 30 MG PO TABS
30.0000 mg | ORAL_TABLET | Freq: Two times a day (BID) | ORAL | 0 refills | Status: AC
  Filled 2023-07-18: qty 60, 30d supply, fill #0

## 2023-06-12 MED ORDER — BUPROPION HCL ER (XL) 150 MG PO TB24
150.0000 mg | ORAL_TABLET | Freq: Every day | ORAL | 0 refills | Status: DC
  Filled 2023-06-12: qty 90, 90d supply, fill #0

## 2023-06-12 MED ORDER — ALPRAZOLAM 0.5 MG PO TABS
0.2500 mg | ORAL_TABLET | Freq: Every day | ORAL | 2 refills | Status: DC | PRN
  Filled 2023-06-12: qty 30, 30d supply, fill #0

## 2023-06-12 MED ORDER — ARIPIPRAZOLE 2 MG PO TABS
2.0000 mg | ORAL_TABLET | Freq: Every evening | ORAL | 0 refills | Status: DC
  Filled 2023-06-12 – 2023-06-19 (×2): qty 90, 90d supply, fill #0

## 2023-06-12 MED ORDER — AMPHETAMINE-DEXTROAMPHETAMINE 30 MG PO TABS
30.0000 mg | ORAL_TABLET | Freq: Two times a day (BID) | ORAL | 0 refills | Status: AC
  Filled 2023-08-17 (×2): qty 60, 30d supply, fill #0

## 2023-06-16 DIAGNOSIS — M797 Fibromyalgia: Secondary | ICD-10-CM | POA: Diagnosis not present

## 2023-06-18 ENCOUNTER — Other Ambulatory Visit (HOSPITAL_COMMUNITY): Payer: Self-pay

## 2023-06-19 ENCOUNTER — Other Ambulatory Visit (HOSPITAL_COMMUNITY): Payer: Self-pay

## 2023-06-19 ENCOUNTER — Encounter: Payer: Self-pay | Admitting: Adult Health

## 2023-06-19 ENCOUNTER — Ambulatory Visit (HOSPITAL_COMMUNITY): Admission: RE | Admit: 2023-06-19 | Payer: Commercial Managed Care - PPO | Source: Ambulatory Visit

## 2023-06-19 ENCOUNTER — Ambulatory Visit (INDEPENDENT_AMBULATORY_CARE_PROVIDER_SITE_OTHER): Payer: Commercial Managed Care - PPO | Admitting: Adult Health

## 2023-06-19 VITALS — BP 112/79 | HR 105 | Ht 64.5 in | Wt 175.0 lb

## 2023-06-19 DIAGNOSIS — E049 Nontoxic goiter, unspecified: Secondary | ICD-10-CM | POA: Diagnosis not present

## 2023-06-19 DIAGNOSIS — Z1211 Encounter for screening for malignant neoplasm of colon: Secondary | ICD-10-CM

## 2023-06-19 DIAGNOSIS — Z1231 Encounter for screening mammogram for malignant neoplasm of breast: Secondary | ICD-10-CM

## 2023-06-19 DIAGNOSIS — Z01419 Encounter for gynecological examination (general) (routine) without abnormal findings: Secondary | ICD-10-CM | POA: Diagnosis not present

## 2023-06-19 LAB — HEMOCCULT GUIAC POC 1CARD (OFFICE): Fecal Occult Blood, POC: NEGATIVE

## 2023-06-19 NOTE — Progress Notes (Signed)
Patient ID: [name], female   DOB: 06-20-1981, 42 y.o.   MRN: [id number] History of Present Illness: [name] is a 42 year old white female, married, G3P3003, in for a well woman gyn exam. She is having a fever of 110.2 to 100.8 every night and has pain in joints on fingers and hips and has gained weight and has chronic constipation(uses miralax). She has seen PCP, and rheumatologist, and had lots of blood work, told she had fibromyalgia. Lyme's test was negative.  Legs throb at night too and feels like needs to void often. She did have COVID in 2020.       Component Value Date/Time   DIAGPAP  12/11/2021 0937    - Negative for intraepithelial lesion or malignancy (NILM)   HPVHIGH Negative 12/11/2021 0937   ADEQPAP  12/11/2021 0937    Satisfactory for evaluation; transformation zone component ABSENT.    PCP is Harlow Mares FNP.   Current Medications, Allergies, Past Medical History, Past Surgical History, Family History and Social History were reviewed in [employer] record.     Review of Systems: Patient denies any headaches, hearing loss, fatigue, blurred vision, shortness of breath, chest pain, abdominal pain, problems with intercourse. No  mood swings.  See HPI for positives.    Physical Exam:BP 112/79 (BP Location: Left Arm, Patient Position: Sitting, Cuff Size: Normal)   Pulse (!) 105   Ht 5' 4.5" (1.638 m)   Wt 175 lb (79.4 kg)   LMP 05/25/2023 (Approximate)   BMI 29.57 kg/m   General:  Well developed, well nourished, no acute distress Skin:  Warm and dry Neck:  Midline trachea, thyroid feels enlarged, good ROM, no lymphadenopathy Lungs; Clear to auscultation bilaterally Breast:  No dominant palpable mass, retraction, or nipple discharge Cardiovascular: Regular rate and rhythm Abdomen:  Soft, non tender, no hepatosplenomegaly Pelvic:  External genitalia is normal in appearance, no lesions.  The vagina is normal in appearance. Urethra has no  lesions or masses. The cervix is bulbous.  Uterus is felt to be normal size, shape, and contour.  No adnexal masses or tenderness noted.Bladder is non tender, no masses felt. Rectal: Good sphincter tone, no polyps, or hemorrhoids felt.  Hemoccult negative. Extremities/musculoskeletal:  No swelling or varicosities noted, no clubbing or cyanosis Psych:  No mood changes, alert and cooperative,seems happy AA is 1 Fall risk is moderate    06/19/2023   10:41 AM 06/04/2023   11:10 AM 04/02/2023    8:23 AM  Depression screen PHQ 2/9  Decreased Interest 0 1 1  Down, Depressed, Hopeless 0 0 2  PHQ - 2 Score 0 1 3  Altered sleeping 1 2 0  Tired, decreased energy 2 3 1   Change in appetite 2 1 1   Feeling bad or failure about yourself  0 0 1  Trouble concentrating 1 0 1  Moving slowly or fidgety/restless 1 0 2  Suicidal thoughts 0 0 0  PHQ-9 Score 7 7 9   Difficult doing work/chores  Somewhat difficult Somewhat difficult       06/19/2023   10:41 AM 04/02/2023    8:24 AM 01/02/2023    1:10 PM 12/26/2022    9:18 AM  GAD 7 : Generalized Anxiety Score  Nervous, Anxious, on Edge 1 2 0 1  Control/stop worrying 1 3 0 1  Worry too much - different things 1 3 0 1  Trouble relaxing 1 2 0 1  Restless 1 2 0 1  Easily annoyed  or irritable 1 3 0 2  Afraid - awful might happen 1 3 0 1  Total GAD 7 Score 7 18 0 8  Anxiety Difficulty  Very difficult Not difficult at all Not difficult at all    Upstream - 06/19/23 1040       Pregnancy Intention Screening   Does the patient want to become pregnant in the next year? No    Does the patient's partner want to become pregnant in the next year? No    Would the patient like to discuss contraceptive options today? No      Contraception Wrap Up   Current Method Female Sterilization    End Method Female Sterilization    Contraception Counseling Provided No            Examination chaperoned by Malachy Mood LPN    Impression and Plan: 1. Encounter for well  woman exam with routine gynecological exam Pap in 2026 Physical in 1 year Get mammogram Talk with PCP about possible long haul COVID   2. Encounter for screening fecal occult blood testing Hemoccult was negative  - POCT occult blood stool  3. Thyroid enlarged Thyroid US scheduled for 06/25/23 at 2:30 pm at [facility] to assess thyroid  - US THYROID; Future

## 2023-06-21 LAB — ALLERGENS (22) FOODS IGG
Casein IgG: 3.2 ug/mL — ABNORMAL HIGH (ref 0.0–1.9)
Cheese, Mold Type IgG: 8.1 ug/mL — ABNORMAL HIGH (ref 0.0–1.9)
Chicken IgG: <2 ug/mL (ref 0.0–1.9)
Chocolate/Cacao IgG: 2.6 ug/mL — ABNORMAL HIGH (ref 0.0–1.9)
Coffee IgG: 3.2 ug/mL — ABNORMAL HIGH (ref 0.0–1.9)
Corn IgG: 8.8 ug/mL — ABNORMAL HIGH (ref 0.0–1.9)
Egg, Whole IgG: 6.2 ug/mL — ABNORMAL HIGH (ref 0.0–1.9)
Green Bean IgG: 3.4 ug/mL — ABNORMAL HIGH (ref 0.0–1.9)
Haddock IgG: <2 ug/mL (ref 0.0–1.9)
Lamb IgG: 3 ug/mL — ABNORMAL HIGH (ref 0.0–1.9)
Oat IgG: 5.6 ug/mL — ABNORMAL HIGH (ref 0.0–1.9)
Onion IgG: 3.5 ug/mL — ABNORMAL HIGH (ref 0.0–1.9)
Peanut IgG: 6.3 ug/mL — ABNORMAL HIGH (ref 0.0–1.9)
Pork IgG: 5.1 ug/mL — ABNORMAL HIGH (ref 0.0–1.9)
Potato, White, IgG: 2.3 ug/mL — ABNORMAL HIGH (ref 0.0–1.9)
Rye IgG: 5.7 ug/mL — ABNORMAL HIGH (ref 0.0–1.9)
Shrimp IgG: 2.3 ug/mL — ABNORMAL HIGH (ref 0.0–1.9)
Soybean IgG: 16.6 ug/mL — ABNORMAL HIGH (ref 0.0–1.9)
Tomato IgG: 3.6 ug/mL — ABNORMAL HIGH (ref 0.0–1.9)
Wheat IgG: 2.3 ug/mL — ABNORMAL HIGH (ref 0.0–1.9)
Yeast IgG: 2.6 ug/mL — ABNORMAL HIGH (ref 0.0–1.9)

## 2023-06-21 LAB — VITAMIN D 25 HYDROXY (VIT D DEFICIENCY, FRACTURES): Vit D, 25-Hydroxy: 35.4 ng/mL (ref 30.0–100.0)

## 2023-06-22 ENCOUNTER — Encounter: Payer: Self-pay | Admitting: Physical Medicine and Rehabilitation

## 2023-06-25 ENCOUNTER — Ambulatory Visit (HOSPITAL_COMMUNITY): Payer: Commercial Managed Care - PPO

## 2023-06-26 ENCOUNTER — Encounter: Payer: Self-pay | Admitting: Family Medicine

## 2023-06-26 ENCOUNTER — Ambulatory Visit (HOSPITAL_COMMUNITY)
Admission: RE | Admit: 2023-06-26 | Discharge: 2023-06-26 | Disposition: A | Discharge status: Home / Self Care | Payer: Commercial Managed Care - PPO | Source: Ambulatory Visit | Attending: Adult Health | Admitting: Adult Health

## 2023-06-26 ENCOUNTER — Ambulatory Visit: Payer: Commercial Managed Care - PPO | Admitting: Family Medicine

## 2023-06-26 VITALS — BP 101/68 | HR 97 | Temp 97.9°F | Ht 64.5 in | Wt 173.0 lb

## 2023-06-26 DIAGNOSIS — E049 Nontoxic goiter, unspecified: Secondary | ICD-10-CM | POA: Insufficient documentation

## 2023-06-26 DIAGNOSIS — M7062 Trochanteric bursitis, left hip: Secondary | ICD-10-CM | POA: Diagnosis not present

## 2023-06-26 MED ORDER — METHYLPREDNISOLONE ACETATE 80 MG/ML IJ SUSP
80.0000 mg | Freq: Once | INTRAMUSCULAR | Status: AC
Start: 2023-06-26 — End: 2023-06-26
  Administered 2023-06-26: 80 mg via INTRA_ARTICULAR

## 2023-06-26 NOTE — Addendum Note (Signed)
Addended by: Hessie Diener on: 06/26/2023 03:18 PM   Modules accepted: Orders

## 2023-06-26 NOTE — Patient Instructions (Signed)
Hip Bursitis  Hip bursitis is the swelling of one or more of the fluid-filled sacs (bursae) in the hip joint. The hip bursae absorb shocks and prevent bones from rubbing against each other. If a bursa becomes irritated, it can fill with extra fluid and become inflamed. Hip bursitis can cause mild to moderate pain, and symptoms often come and go over time. What are the causes? This condition results from increased friction between the hip bones and the tendons around the hip joint. This condition can happen if you: Overuse your hip muscles. Injure your hip. Have weak buttocks muscles. Have bone spurs. Have an infection. In some cases, the cause may not be known. What increases the risk? You are more likely to develop this condition if: You injured your hip previously or had hip surgery. You have a medical condition, such as arthritis, gout, diabetes, or thyroid disease. You have spine problems. You have one leg that is shorter than the other. You participate in athletic activities that include repetitive motion, like running. You participate in sports where there is a risk of injury or falling, such as football, martial arts, or skiing. What are the signs or symptoms? Symptoms may come and go, and they often include: Pain in the hip or groin area. Pain may get worse with movement. Tenderness and swelling of the hip. In rare cases, the bursa may become infected. If this happens, you may get a fever, as well as warmth and redness in the hip area. How is this diagnosed? This condition may be diagnosed based on: Your symptoms. Your medical history. A physical exam. Imaging tests, such as: X-rays to check your bones. MRI or ultrasound to check your tendons and muscles. Bone scan. How is this treated? This condition is treated by resting, icing, applying pressure (compression), and raising (elevating) the injured area. This is called RICE treatment. In some cases, RICE treatment may not  be enough to make your symptoms go away. Treatment may also include: Using crutches, a cane, or a walker to decrease the strain on your hip. Taking medicine to help with swelling and pain. Getting a shot of cortisone medicine near the affected area to reduce swelling and pain. Taking antibiotic medicines if there is an infection. Draining fluid out of the bursa to help relieve swelling and pain. Having surgery to remove a damaged or infected bursa. This is rare. Long-term treatment may include: Physical therapy exercises for strength and flexibility. Identifying the cause of your bursitis to prevent future episodes. Lifestyle changes, such as weight loss, to reduce the strain on the hip. Follow these instructions at home: Managing pain, stiffness, and swelling     If directed, put ice on the affected area. To do this: Put ice in a plastic bag. Place a towel between your skin and the bag. Leave the ice on for 20 minutes, 2-3 times a day. Remove the ice if your skin turns bright red. This is very important. If you cannot feel pain, heat, or cold, you have a greater risk of damage to the area. Elevate your hip as much as you can without feeling pain. To do this, put a pillow under your hips while you lie down. If directed, apply heat to the affected area as often as told by your health care provider. Use the heat source that your health care provider recommends, such as a moist heat pack or a heating pad. Place a towel between your skin and the heat source. Leave the heat   on for 20-30 minutes. Remove the heat if your skin turns bright red. This is especially important if you are unable to feel pain, heat, or cold. You may have a greater risk of getting burned. Activity Do not use your hip to support your body weight until your health care provider says that you can. Use crutches, a cane, or a walker as told by your health care provider. If the affected leg is one that you use to drive, ask  your health care provider if it is safe to drive. Rest and protect your hip as much as possible until your pain and swelling get better. Return to your normal activities as told by your health care provider. Ask your health care provider what activities are safe for you. Do exercises as told by your health care provider. General instructions Take over-the-counter and prescription medicines only as told by your health care provider. Gently massage and stretch your injured area as often as is comfortable. Wear compression wraps only as told by your health care provider. If one of your legs is shorter than the other, get fitted for a shoe insert or orthotic. Your health care provider or physical therapist can tell you where to find these items and what size you need. Maintain a healthy weight. Follow instructions from your health care provider for weight control. These may include dietary restrictions. Keep all follow-up visits. This is important. How is this prevented? Exercise regularly or as told by your health care provider. Wear supportive footwear that is appropriate for your sport and daily activities. Warm up and stretch before being active. Cool down and stretch after being active. Take breaks regularly from repetitive activity. If an activity irritates your hip or causes pain, avoid the activity as much as possible. Avoid sitting down for long periods at a time. Where to find more information American Academy of Orthopaedic Surgeons: orthoinfo.aaos.org Contact a health care provider if: You have a fever. You develop new symptoms. You have trouble walking or doing everyday activities. You have pain that gets worse or does not get better with medicine. You develop red skin or a feeling of warmth in your hip area. Get help right away if: You cannot move your hip. You have severe pain. You cannot control the muscles in your feet. Summary Hip bursitis is the swelling of one or more  of the fluid-filled sacs (bursae) in the hip joint. Hip bursitis can cause hip or groin pain, and symptoms often come and go over time. This condition is often treated by resting, icing, applying pressure (compression), and raising (elevating) the injured area. Other treatments may be needed. This information is not intended to replace advice given to you by your health care provider. Make sure you discuss any questions you have with your health care provider. Document Revised: 10/01/2021 Document Reviewed: 10/01/2021 Elsevier Patient Education  2024 [employer].

## 2023-06-26 NOTE — Progress Notes (Signed)
   Acute Office Visit  Subjective:     Patient ID: [name], female    DOB: [birth year], 42 y.o.   MRN: [id number]  Chief Complaint  Patient presents with   Hip Pain    Hip Pain  There was no injury mechanism. The pain is present in the left leg and left hip. The quality of the pain is described as aching (sharp). Pain severity now: 8 at night, mild otherwise. The pain has been Constant since onset. Associated symptoms include numbness (intermittent left lower leg). Pertinent negatives include no inability to bear weight, loss of motion, loss of sensation, muscle weakness or tingling. The symptoms are aggravated by movement. She has tried acetaminophen, NSAIDs, rest and ice for the symptoms. The treatment provided no relief.   Pain has been ongoing for months now, worsening. Pain radiates down lateral aspect of leg. Hip is tender to the touch. Pain is aggravating by lying on that side, increased activity, getting dressed, rising from a sitting position.   Review of Systems  Neurological:  Positive for numbness (intermittent left lower leg). Negative for tingling.        Objective:    BP 101/68   Pulse 97   Temp 97.9 F (36.6 C) (Temporal)   Ht 5' 4.5" (1.638 m)   Wt 173 lb (78.5 kg)   LMP 05/25/2023 (Approximate)   SpO2 100%   BMI 29.24 kg/m    Physical Exam Vitals and nursing note reviewed.  Constitutional:      General: She is not in acute distress.    Appearance: She is not ill-appearing, toxic-appearing or diaphoretic.  Musculoskeletal:     Left hip: Tenderness (over great trocanteric) present. No deformity, lacerations or crepitus.     Right lower leg: No edema.     Left lower leg: No edema.  Skin:    General: Skin is warm and dry.  Neurological:     General: No focal deficit present.     Mental Status: She is alert and oriented to person, place, and time.  Psychiatric:        Mood and Affect: Mood normal.        Behavior: Behavior normal.    Joint  Injection/Arthrocentesis  Date/Time: 06/26/2023 9:55 AM  Performed by: Gabriel Earing, FNP Authorized by: Gabriel Earing, FNP  Indications: pain  Body area: hip Joint: left hip Local anesthesia used: yes  Anesthesia: Local anesthesia used: yes Local anesthetic: ethyl chloride.  Sedation: Patient sedated: no  Preparation: Patient was prepped and draped in the usual sterile fashion. Needle size: 22 G Ultrasound guidance: no Approach: medial Methylprednisolone amount: 80 mg Lidocaine 2% amount: 1 mL Patient tolerance: patient tolerated the procedure well with no immediate complications Comments: Injection into greater trocanteric bursa of left hip.      No results found for any visits on 06/26/23.      Assessment & Plan:   [name] was seen today for hip pain.  Diagnoses and all orders for this visit:  Trochanteric bursitis of left hip Injection of left trochanteric bursa today. Patient tolerated procedure well. Continue NSAIDs. Ice, heat, stretching, rest.  -     Joint Injection/Arthrocentesis  Return to office for new or worsening symptoms, or if symptoms persist.   The patient indicates understanding of these issues and agrees with the plan.  Gabriel Earing, FNP

## 2023-07-01 ENCOUNTER — Ambulatory Visit: Payer: Commercial Managed Care - PPO | Admitting: Orthopedic Surgery

## 2023-07-01 ENCOUNTER — Telehealth: Payer: Commercial Managed Care - PPO | Admitting: Physician Assistant

## 2023-07-01 DIAGNOSIS — R3989 Other symptoms and signs involving the genitourinary system: Secondary | ICD-10-CM

## 2023-07-01 MED ORDER — CEPHALEXIN 500 MG PO CAPS: 500.0000 mg | ORAL_CAPSULE | Freq: Two times a day (BID) | ORAL | 0 refills | Status: AC

## 2023-07-01 NOTE — Progress Notes (Signed)
I have spent 5 minutes in review of e-visit questionnaire, review and updating patient chart, medical decision making and response to patient.   William Cody Martin, PA-C    

## 2023-07-01 NOTE — Progress Notes (Signed)

## 2023-07-03 ENCOUNTER — Encounter
Payer: Commercial Managed Care - PPO | Attending: Physical Medicine and Rehabilitation | Admitting: Physical Medicine and Rehabilitation

## 2023-07-03 ENCOUNTER — Encounter (HOSPITAL_BASED_OUTPATIENT_CLINIC_OR_DEPARTMENT_OTHER): Payer: Commercial Managed Care - PPO | Admitting: Physical Medicine and Rehabilitation

## 2023-07-03 DIAGNOSIS — M7918 Myalgia, other site: Secondary | ICD-10-CM | POA: Insufficient documentation

## 2023-07-03 DIAGNOSIS — M797 Fibromyalgia: Secondary | ICD-10-CM

## 2023-07-03 DIAGNOSIS — R6884 Jaw pain: Secondary | ICD-10-CM | POA: Insufficient documentation

## 2023-07-03 DIAGNOSIS — M25552 Pain in left hip: Secondary | ICD-10-CM

## 2023-07-03 DIAGNOSIS — R200 Anesthesia of skin: Secondary | ICD-10-CM | POA: Insufficient documentation

## 2023-07-03 DIAGNOSIS — M722 Plantar fascial fibromatosis: Secondary | ICD-10-CM | POA: Insufficient documentation

## 2023-07-03 DIAGNOSIS — G4489 Other headache syndrome: Secondary | ICD-10-CM | POA: Diagnosis not present

## 2023-07-03 NOTE — Progress Notes (Signed)
Subjective:    Patient ID: [name], female    DOB: 1981/01/14, 42 y.o.   MRN: [id number]  HPI An audio/video tele-health visit is felt to be the most appropriate encounter for this patient at this time. This is a follow up tele-visit via phone. The patient is at home. MD is at office. Prior to scheduling this appointment, our staff discussed the limitations of evaluation and management by telemedicine and the availability of in-person appointments. The patient expressed understanding and agreed to proceed.    [name] is a 42 year old woman who presents to establish care for fatigue, arthritis, and fevers  1) Jaw tightness -started three months ago after a very stressful event  2) Finger numbness -feels like bee stings  3) Left hip pain: -she felt it when playing volleyball with her daughter -discussed temporary benefit from steroid injection  4) Plantar fasciitis of left foot  5) Right buttock pain -feels like she is sitting on a knot  6) Food sensitivity headache: -she has headaches once or twice per week after lunch toward the late afternoon  Pain Inventory Average Pain 7 Pain Right Now 5 My pain is intermittent, sharp, burning, and aching  In the last 24 hours, has pain interfered with the following? General activity 0 Relation with others 0 Enjoyment of life 0 What TIME of day is your pain at its worst?  Sleep (in general) NA  Pain is worse with: bending and sitting Pain improves with: pacing activities, medication, and TENS Relief from Meds: 3  walk without assistance do you drive?  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  no  Any changes since last visit?  no  has seen a rheumatologist and ruled out lupus    Family History  Problem Relation Age of Onset   Heart disease Mother    COPD Mother    Lung cancer  Mother    Osteoporosis Mother    Alcohol abuse Father    Bipolar disorder Father    Prostate cancer Father    Melanoma Paternal Grandmother    Arthritis/Rheumatoid Maternal Aunt    Social History   Socioeconomic History   Marital status: Married    Spouse name: Not on file   Number of children: 3   Years of education: Not on file   Highest education level: Associate degree: occupational, Scientist, product/process development, or vocational program  Occupational History   Not on file  Tobacco Use   Smoking status: Some Days    Current packs/day: 0.00    Types: Cigarettes    Last attempt to quit: 06/08/2019    Years since quitting: 4.0    Passive exposure: Past   Smokeless tobacco: Never  Vaping Use   Vaping status: Former  Substance and Sexual Activity   Alcohol use: Yes    Alcohol/week: 0.0 standard drinks of alcohol    Comment: occ   Drug use: No   Sexual activity: Yes    Birth control/protection: Surgical    Comment: tubal  Other Topics Concern   Not on file  Social History Narrative   Not on file   Social Determinants of Health   Financial Resource Strain: Low Risk  (06/25/2023)   Overall Financial Resource Strain (CARDIA)    Difficulty of Paying Living Expenses: Not very hard  Food Insecurity: No Food Insecurity (06/25/2023)  Hunger Vital Sign    Worried About Running Out of Food in the Last Year: Never true    Ran Out of Food in the Last Year: Never true  Transportation Needs: No Transportation Needs (06/25/2023)   PRAPARE - Administrator, Civil Service (Medical): No    Lack of Transportation (Non-Medical): No  Physical Activity: Insufficiently Active (06/25/2023)   Exercise Vital Sign    Days of Exercise per Week: 4 days    Minutes of Exercise per Session: 20 min  Stress: No Stress Concern Present (06/25/2023)   [employer] of Occupational Health - Occupational Stress Questionnaire    Feeling of Stress : Only a little  Social Connections: Moderately Integrated  (06/25/2023)   Social Connection and Isolation Panel [NHANES]    Frequency of Communication with Friends and Family: More than three times a week    Frequency of Social Gatherings with Friends and Family: Once a week    Attends Religious Services: 1 to 4 times per year    Active Member of [employer] or Organizations: No    Attends Banker Meetings: Never    Marital Status: Married   Past Surgical History:  Procedure Laterality Date   APPENDECTOMY  2019   CHOLECYSTECTOMY     LAPAROSCOPIC APPENDECTOMY N/A 05/03/2018   Procedure: APPENDECTOMY LAPAROSCOPIC;  Surgeon: Gaynelle Adu, MD;  Location: [facility] OR;  Service: General;  Laterality: N/A;   TUBAL LIGATION     Past Medical History:  Diagnosis Date   Back pain    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, nerve impingment    Bipolar 1 disorder, mixed (HCC)    Depression    Fibromyalgia    Hair pulling    HSIL on Pap smear of cervix 2013   GYN- [facility]   Migraines    LMP 05/25/2023 (Approximate)   Opioid Risk Score:   Fall Risk Score:  `1  Depression screen [facility] 2/9     06/26/2023    9:24 AM 06/19/2023   10:41 AM 06/04/2023   11:10 AM 04/02/2023    8:23 AM 01/02/2023    1:10 PM 12/26/2022    9:19 AM 12/26/2022    9:18 AM  Depression screen PHQ 2/9  Decreased Interest 0 0 1 1 0  0  Down, Depressed, Hopeless 0 0 0 2 0  0  PHQ - 2 Score 0 0 1 3 0  0  Altered sleeping 0 1 2 0 0 0   Tired, decreased energy 0 2 3 1  0 0   Change in appetite 1 2 1 1  0 0   Feeling bad or failure about yourself  0 0 0 1 0 0   Trouble concentrating 1 1 0 1 0 0   Moving slowly or fidgety/restless 1 1 0 2 0 0   Suicidal thoughts 0 0 0 0 0 0   PHQ-9 Score 3 7 7 9  0    Difficult doing work/chores Not difficult at all  Somewhat difficult Somewhat difficult Not difficult at all Not difficult at all     Review of Systems  Constitutional:  Positive for chills, diaphoresis, fatigue, fever and unexpected weight change.  HENT: Negative.    Eyes: Negative.    Respiratory: Negative.    Cardiovascular: Negative.   Gastrointestinal:  Positive for constipation.  Endocrine: Negative.   Genitourinary: Negative.   Musculoskeletal:  Positive for arthralgias, back pain and myalgias.  Skin: Negative.   Allergic/Immunologic: Negative.  Neurological:  Positive for tremors and weakness.       Tingling  Hematological: Negative.   Psychiatric/Behavioral:  Positive for dysphoric mood.   All other systems reviewed and are negative.      Objective:   Physical Exam Gen: no distress, normal appearing HEENT: oral mucosa pink and moist, NCAT Cardio: Reg rate Chest: normal effort, normal rate of breathing Abd: soft, non-distended Ext: no edema Psych: pleasant, normal affect Skin: intact Neuro: Alert and oriented x3      Assessment & Plan:   1) Right buttock pain: -discussed physical therapy  2) Jaw pain -discussed that this has improved  3) Fibromyalgia -discussed mechanism of action of low dose naltrexone as an opioid receptor antagonist which stimulates your body's production of its own natural endogenous opioids, helping to decrease pain. Discussed that it can also decrease T cell response and thus be helpful in decreasing inflammation, and symptoms of brain fog, fatigue, anxiety, depression, and allergies. Discussed that this medication needs to be compounded at a compounding pharmacy and can more expensive. Discussed that I usually start at 1mg  and if this is not providing enough relief then I titrate upward on a monthly basis.    -increased low dose naltrexone to 2mg   -continue support groups  4) Left hip pain: -discussed minimal benefit from steroid injection -Turmeric to reduce inflammation--can be used in cooking or taken as a supplement.  Benefits of turmeric:  -Highly anti-inflammatory  -Increases antioxidants  -Improves memory, attention, brain disease  -Lowers risk of heart disease  -May help prevent  cancer  -Decreases pain  -Alleviates depression  -Delays aging and decreases risk of chronic disease  -Consume with black pepper to increase absorption    Turmeric Milk Recipe:  1 cup milk  1 tsp turmeric  1 tsp cinnamon  1 tsp grated ginger (optional)  Black pepper (boosts the anti-inflammatory properties of turmeric).  1 tsp honey   5) Left plantar fascitis: Red light therapy  6) Food sensitivity headaches: Discussed food allergen testing  10 minutes spent in discusison of her food allergen testing results, recommended elimination diet of soy/corn/wheat/and casein since she was producing the most antibodies to these foods, discussed increasing low dose naltrexone to 2mg /discussed this has helped her mood and energy but not her pain, discussed minimal benefits from recent steroid injection

## 2023-07-18 ENCOUNTER — Other Ambulatory Visit (HOSPITAL_COMMUNITY): Payer: Self-pay

## 2023-08-02 ENCOUNTER — Encounter: Payer: Self-pay | Admitting: Family Medicine

## 2023-08-03 ENCOUNTER — Other Ambulatory Visit: Payer: Self-pay | Admitting: Family Medicine

## 2023-08-11 DIAGNOSIS — F411 Generalized anxiety disorder: Secondary | ICD-10-CM | POA: Diagnosis not present

## 2023-08-11 DIAGNOSIS — F331 Major depressive disorder, recurrent, moderate: Secondary | ICD-10-CM | POA: Diagnosis not present

## 2023-08-11 DIAGNOSIS — F902 Attention-deficit hyperactivity disorder, combined type: Secondary | ICD-10-CM | POA: Diagnosis not present

## 2023-08-11 DIAGNOSIS — F429 Obsessive-compulsive disorder, unspecified: Secondary | ICD-10-CM | POA: Diagnosis not present

## 2023-08-17 ENCOUNTER — Other Ambulatory Visit (HOSPITAL_COMMUNITY): Payer: Self-pay

## 2023-08-17 ENCOUNTER — Telehealth: Payer: Commercial Managed Care - PPO | Admitting: Family Medicine

## 2023-08-17 ENCOUNTER — Other Ambulatory Visit: Payer: Self-pay

## 2023-08-17 DIAGNOSIS — M549 Dorsalgia, unspecified: Secondary | ICD-10-CM

## 2023-08-17 MED ORDER — CYCLOBENZAPRINE HCL 10 MG PO TABS
10.0000 mg | ORAL_TABLET | Freq: Three times a day (TID) | ORAL | 0 refills | Status: AC | PRN
Start: 2023-08-17 — End: ?

## 2023-08-17 MED ORDER — NAPROXEN 500 MG PO TABS
500.0000 mg | ORAL_TABLET | Freq: Two times a day (BID) | ORAL | 0 refills | Status: DC
Start: 2023-08-17 — End: 2023-11-04

## 2023-08-17 NOTE — Progress Notes (Signed)
E-Visit for Back Pain   We are sorry that you are not feeling well.  Here is how we plan to help!  Based on what you have shared with me it looks like you mostly have acute back pain.  Acute back pain is defined as musculoskeletal pain that can resolve in 1-3 weeks with conservative treatment.   We have recently reviewed use of steroids, and try to avoid using until other options are not working.   Follow up in a few days if these are not working.    I have prescribed Naprosyn 500 mg take one by mouth twice a day non-steroid anti-inflammatory (NSAID) as well as Flexeril 10 mg every eight hours as needed which is a muscle relaxer  Some patients experience stomach irritation or in increased heartburn with anti-inflammatory drugs.  Please keep in mind that muscle relaxer's can cause fatigue and should not be taken while at work or driving.  Back pain is very common.  The pain often gets better over time.  The cause of back pain is usually not dangerous.  Most people can learn to manage their back pain on their own.  Home Care Stay active.  Start with short walks on flat ground if you can.  Try to walk farther each day. Do not sit, drive or stand in one place for more than 30 minutes.  Do not stay in bed. Do not avoid exercise or work.  Activity can help your back heal faster. Be careful when you bend or lift an object.  Bend at your knees, keep the object close to you, and do not twist. Sleep on a firm mattress.  Lie on your side, and bend your knees.  If you lie on your back, put a pillow under your knees. Only take medicines as told by your doctor. Put ice on the injured area. Put ice in a plastic bag Place a towel between your skin and the bag Leave the ice on for 15-20 minutes, 3-4 times a day for the first 2-3 days. 210 After that, you can switch between ice and heat packs. Ask your doctor about back exercises or massage. Avoid feeling anxious or stressed.  Find good ways to deal with  stress, such as exercise.  Get Help Right Way If: Your pain does not go away with rest or medicine. Your pain does not go away in 1 week. You have new problems. You do not feel well. The pain spreads into your legs. You cannot control when you poop (bowel movement) or pee (urinate) You feel sick to your stomach (nauseous) or throw up (vomit) You have belly (abdominal) pain. You feel like you may pass out (faint). If you develop a fever.  Make Sure you: Understand these instructions. Will watch your condition Will get help right away if you are not doing well or get worse.  Your e-visit answers were reviewed by a board certified advanced clinical practitioner to complete your personal care plan.  Depending on the condition, your plan could have included both over the counter or prescription medications.  If there is a problem please reply  once you have received a response from your provider.  Your safety is important to [place].  If you have drug allergies check your prescription carefully.    You can use MyChart to ask questions about today's visit, request a non-urgent call back, or ask for a work or school excuse for 24 hours related to this e-Visit. If it has been  greater than 24 hours you will need to follow up with your provider, or enter a new e-Visit to address those concerns.  You will get an e-mail in the next two days asking about your experience.  I hope that your e-visit has been valuable and will speed your recovery. Thank you for using e-visits.  I provided 5 minutes of non face-to-face time during this encounter for chart review, medication and order placement, as well as and documentation.

## 2023-09-03 ENCOUNTER — Encounter: Payer: Self-pay | Admitting: Physical Medicine and Rehabilitation

## 2023-09-04 ENCOUNTER — Encounter: Payer: Commercial Managed Care - PPO | Admitting: Physical Medicine and Rehabilitation

## 2023-09-08 DIAGNOSIS — F429 Obsessive-compulsive disorder, unspecified: Secondary | ICD-10-CM | POA: Diagnosis not present

## 2023-09-08 DIAGNOSIS — F902 Attention-deficit hyperactivity disorder, combined type: Secondary | ICD-10-CM | POA: Diagnosis not present

## 2023-09-08 DIAGNOSIS — F411 Generalized anxiety disorder: Secondary | ICD-10-CM | POA: Diagnosis not present

## 2023-09-08 DIAGNOSIS — F331 Major depressive disorder, recurrent, moderate: Secondary | ICD-10-CM | POA: Diagnosis not present

## 2023-10-20 ENCOUNTER — Other Ambulatory Visit (HOSPITAL_COMMUNITY): Payer: Self-pay | Admitting: Family Medicine

## 2023-10-20 DIAGNOSIS — Z1231 Encounter for screening mammogram for malignant neoplasm of breast: Secondary | ICD-10-CM

## 2023-10-28 ENCOUNTER — Inpatient Hospital Stay (HOSPITAL_COMMUNITY): Admission: RE | Admit: 2023-10-28 | Payer: Commercial Managed Care - PPO | Source: Ambulatory Visit

## 2023-10-28 DIAGNOSIS — Z1231 Encounter for screening mammogram for malignant neoplasm of breast: Secondary | ICD-10-CM

## 2023-11-02 ENCOUNTER — Encounter: Payer: Self-pay | Admitting: Physical Medicine and Rehabilitation

## 2023-11-04 ENCOUNTER — Other Ambulatory Visit (INDEPENDENT_AMBULATORY_CARE_PROVIDER_SITE_OTHER): Payer: Self-pay

## 2023-11-04 ENCOUNTER — Encounter: Payer: Self-pay | Admitting: Orthopedic Surgery

## 2023-11-04 ENCOUNTER — Ambulatory Visit: Payer: Commercial Managed Care - PPO | Admitting: Orthopedic Surgery

## 2023-11-04 VITALS — BP 120/79 | HR 116 | Ht 64.5 in | Wt 168.0 lb

## 2023-11-04 DIAGNOSIS — M7062 Trochanteric bursitis, left hip: Secondary | ICD-10-CM

## 2023-11-04 DIAGNOSIS — M25552 Pain in left hip: Secondary | ICD-10-CM

## 2023-11-04 NOTE — Patient Instructions (Signed)

## 2023-11-04 NOTE — Progress Notes (Signed)
 New Patient Visit  Assessment: [name] is a 43 y.o. female with the following: 1. Greater trochanteric bursitis of left hip 2. Pain in left hip  Plan: [name] has had pain in the left hip for several months.  No specific injury.  She continues to have pain on the lateral side of the hip, directly over the greater trochanteric bursa.  She is unable to lay on her left side.  In addition, she notices a catching sensation in the anterior aspect of the left hip.  This is more concerning for pathology around the hip, with possibility of a labrum injury.  She is active.  She is very busy at work.  This will prevent her from being able to complete her tasks at work.  We discussed proceeding with a greater trochanteric bursa injection, as well as obtaining a left hip MRI.  Injection was completed in clinic today.  She will pay close attention to how much relief she gets.  Once the MRI is complete, we will see him in clinic to discuss the findings.   Procedure note injection - Left lateral hip   Verbal consent was obtained to inject the Left lateral hip.  Patient localized the pain. Timeout was completed to confirm the site of injection.  The skin was prepped with alcohol and ethyl chloride was sprayed at the injection site.  A 21-gauge needle was used to inject 40 mg of Depo-Medrol  and 1% lidocaine  (4 cc) into the Left lateral hip, directly over the localized tenderness using a direct lateral approach.  There were no complications. A sterile bandage was applied.   Follow-up: No follow-ups on file.  Subjective:  Chief Complaint  Patient presents with   Hip Pain    L hip/groin pain for 6 mos.     History of Present Illness: [name] is a 43 y.o. female who presents for evaluation of left hip pain.  She states that she has had pain over the lateral hip, as well as the anterior hip for several months.  She had a greater troches bursa injection in September, which did improve her  symptoms for about a week.  She is unable to lay on her left side.  She notes some pain radiating distally.  She also has pain radiating into the anterior hip.  She notes a catching sensation.  Occasionally, her hip will lock up, and this will prevent her from walking.  She is able to loosen up the hip, and then continue.  She notices that her gait has changed.  She has difficulty at work, because her hip will occasionally lock up.  She was shopping recently, when her hip locked up, and she needed assistance to get out of the grocery store.  Medications have not been effective.  Activity modifications have not been effective.   Review of Systems: No fevers or chills No numbness or tingling No chest pain No shortness of breath No bowel or bladder dysfunction No GI distress No headaches   Medical History:  Past Medical History:  Diagnosis Date   Back pain    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, nerve impingment    Bipolar 1 disorder, mixed (HCC)    Depression    Fibromyalgia    Hair pulling    HSIL on Pap smear of cervix 2013   GYN- [facility]   Migraines     Past Surgical History:  Procedure Laterality Date   APPENDECTOMY  2019  CHOLECYSTECTOMY     LAPAROSCOPIC APPENDECTOMY N/A 05/03/2018   Procedure: APPENDECTOMY LAPAROSCOPIC;  Surgeon: Aldean Hummingbird, MD;  Location: [facility] OR;  Service: General;  Laterality: N/A;   TUBAL LIGATION      Family History  Problem Relation Age of Onset   Heart disease Mother    COPD Mother    Lung cancer Mother    Osteoporosis Mother    Alcohol abuse Father    Bipolar disorder Father    Prostate cancer Father    Melanoma Paternal Grandmother    Arthritis/Rheumatoid Maternal Aunt    Social History   Tobacco Use   Smoking status: Some Days    Current packs/day: 0.00    Types: Cigarettes    Last attempt to quit: 06/08/2019    Years since quitting: 4.4    Passive exposure: Past   Smokeless tobacco: Never  Vaping Use   Vaping status: Former   Substance Use Topics   Alcohol use: Yes    Alcohol/week: 0.0 standard drinks of alcohol    Comment: occ   Drug use: No    No Known Allergies  Current Meds  Medication Sig   amphetamine -dextroamphetamine  (ADDERALL) 30 MG tablet Take 1 tablet by mouth twice daily for ADHD   amphetamine -dextroamphetamine  (ADDERALL) 30 MG tablet Take 1 tablet by mouth twice daily for ADHD   amphetamine -dextroamphetamine  (ADDERALL) 30 MG tablet Take 1 tablet by mouth twice daily for ADHD   ARIPiprazole  (ABILIFY ) 2 MG tablet Take 1 tablet(s) by mouth at bedtime for OCD   cyclobenzaprine  (FLEXERIL ) 10 MG tablet Take 1 tablet (10 mg total) by mouth 3 (three) times daily as needed for muscle spasms.   gabapentin  (NEURONTIN ) 300 MG capsule Take 300 mg by mouth 3 (three) times daily.   NONFORMULARY OR COMPOUNDED ITEM Take 1 mg by mouth at bedtime. Low dose naltrexone   omeprazole  (PRILOSEC) 20 MG capsule Take 1 capsule (20 mg total) by mouth daily.   Turmeric 500 MG CAPS Take by mouth.    Objective: BP 120/79   Pulse (!) 116   Ht 5' 4.5" (1.638 m)   Wt 168 lb (76.2 kg)   BMI 28.39 kg/m   Physical Exam:  General: Alert and oriented. and No acute distress. Gait: Left sided antalgic gait.  Evaluation of left hip demonstrates no deformity.  She is unable to lay on her left side due to pain.  She has tenderness directly over the greater trochanter laterally.  She can maintain a straight leg raise.  She notes some pulling in the anterior aspect of the hip.  She has pain in the anterior hip with flexion to 90 degrees, and internal rotation.  Pain gets worse with external rotation and abduction.  No mapping sensation is palpated.  IMAGING: I personally ordered and reviewed the following images   AP pelvis and left hip x-rays were obtained in clinic today.  No acute injuries noted.  Well-maintained joint space.  No bony lesions.  No evidence of impingement.  Impression: Negative left hip x-ray   New  Medications:  No orders of the defined types were placed in this encounter.     Angel Frater, MD  11/04/2023 10:02 AM

## 2023-11-05 ENCOUNTER — Encounter (HOSPITAL_COMMUNITY): Payer: Self-pay

## 2023-11-05 ENCOUNTER — Other Ambulatory Visit (HOSPITAL_COMMUNITY): Payer: Self-pay

## 2023-11-08 ENCOUNTER — Ambulatory Visit (HOSPITAL_COMMUNITY)
Admission: RE | Admit: 2023-11-08 | Discharge: 2023-11-08 | Disposition: A | Discharge status: Home / Self Care | Payer: Commercial Managed Care - PPO | Source: Ambulatory Visit | Attending: Orthopedic Surgery | Admitting: Orthopedic Surgery

## 2023-11-08 DIAGNOSIS — M7062 Trochanteric bursitis, left hip: Secondary | ICD-10-CM | POA: Diagnosis not present

## 2023-11-08 DIAGNOSIS — M25552 Pain in left hip: Secondary | ICD-10-CM | POA: Insufficient documentation

## 2023-11-08 DIAGNOSIS — S73192A Other sprain of left hip, initial encounter: Secondary | ICD-10-CM | POA: Diagnosis not present

## 2023-11-08 DIAGNOSIS — M7602 Gluteal tendinitis, left hip: Secondary | ICD-10-CM | POA: Diagnosis not present

## 2023-11-11 ENCOUNTER — Ambulatory Visit
Admission: RE | Admit: 2023-11-11 | Discharge: 2023-11-11 | Disposition: A | Discharge status: Home / Self Care | Payer: Commercial Managed Care - PPO | Source: Ambulatory Visit

## 2023-11-11 DIAGNOSIS — Z1231 Encounter for screening mammogram for malignant neoplasm of breast: Secondary | ICD-10-CM | POA: Diagnosis not present

## 2023-11-13 ENCOUNTER — Encounter: Payer: Self-pay | Admitting: Family Medicine

## 2023-11-13 DIAGNOSIS — F429 Obsessive-compulsive disorder, unspecified: Secondary | ICD-10-CM | POA: Diagnosis not present

## 2023-11-13 DIAGNOSIS — F411 Generalized anxiety disorder: Secondary | ICD-10-CM | POA: Diagnosis not present

## 2023-11-13 DIAGNOSIS — F902 Attention-deficit hyperactivity disorder, combined type: Secondary | ICD-10-CM | POA: Diagnosis not present

## 2023-11-13 DIAGNOSIS — F331 Major depressive disorder, recurrent, moderate: Secondary | ICD-10-CM | POA: Diagnosis not present

## 2023-11-17 ENCOUNTER — Ambulatory Visit: Payer: Commercial Managed Care - PPO | Admitting: Orthopedic Surgery

## 2023-11-17 ENCOUNTER — Telehealth: Payer: Self-pay | Admitting: Radiology

## 2023-11-17 NOTE — Telephone Encounter (Addendum)
Patient has her phone on she states she would like a call with report if possible she states the injection only helped for about a week

## 2023-11-19 ENCOUNTER — Encounter: Payer: Self-pay | Admitting: Orthopedic Surgery

## 2023-12-07 ENCOUNTER — Encounter
Payer: Commercial Managed Care - PPO | Attending: Physical Medicine and Rehabilitation | Admitting: Physical Medicine and Rehabilitation

## 2023-12-07 ENCOUNTER — Encounter: Payer: Self-pay | Admitting: Physical Medicine and Rehabilitation

## 2023-12-07 VITALS — BP 112/80 | HR 104 | Ht 64.0 in | Wt 168.6 lb

## 2023-12-07 DIAGNOSIS — M25552 Pain in left hip: Secondary | ICD-10-CM | POA: Insufficient documentation

## 2023-12-07 DIAGNOSIS — S73192S Other sprain of left hip, sequela: Secondary | ICD-10-CM | POA: Diagnosis not present

## 2023-12-07 DIAGNOSIS — M797 Fibromyalgia: Secondary | ICD-10-CM | POA: Diagnosis not present

## 2023-12-07 NOTE — Patient Instructions (Signed)
 Extracorporeal shockwave therapy  PT

## 2023-12-07 NOTE — Progress Notes (Signed)
 Subjective:    Patient ID: [name], female    DOB: 06/15/1981, 43 y.o.   MRN: [id number]  HPI   [name] is a 43 year old woman who presents to establish care for fatigue, arthritis, and fevers  1) Jaw tightness -started three months ago after a very stressful event  2) Finger numbness -feels like bee stings  3) Left hip pain: -she felt it when playing volleyball with her daughter -discussed temporary benefit from steroid injection, 2nd steroid injection gave her 1 week of relief  4) Plantar fasciitis of left foot  5) Right buttock pain -feels like she is sitting on a knot  6) Food sensitivity headache: -she has headaches once or twice per week after lunch toward the late afternoon  7) Fatigue: LDN helps  Pain Inventory Average Pain 4 Pain Right Now 8 My pain is intermittent, sharp, burning, and aching  In the last 24 hours, has pain interfered with the following? General activity 0 Relation with others 0 Enjoyment of life 0 What TIME of day is your pain at its worst?  Sleep (in general) Fair  Pain is worse with: bending and sitting and at night when sleeping Pain improves with: pacing activities, medication, and TENS and icy hot rubs Relief from Meds: 3     Family History  Problem Relation Age of Onset   Heart disease Mother    COPD Mother    Lung cancer Mother    Osteoporosis Mother    Alcohol abuse Father    Bipolar disorder Father    Prostate cancer Father    Melanoma Paternal Grandmother    Arthritis/Rheumatoid Maternal Aunt    Social History   Socioeconomic History   Marital status: Married    Spouse name: Not on file   Number of children: 3   Years of education: Not on file   Highest education level: Associate degree: occupational, Scientist, product/process development, or vocational program  Occupational History   Not on file  Tobacco Use   Smoking status: Some Days    Current packs/day: 0.00    Types: Cigarettes    Last attempt to quit: 06/08/2019     Years since quitting: 4.5    Passive exposure: Past   Smokeless tobacco: Never  Vaping Use   Vaping status: Former  Substance and Sexual Activity   Alcohol use: Yes    Alcohol/week: 0.0 standard drinks of alcohol    Comment: occ   Drug use: No   Sexual activity: Yes    Birth control/protection: Surgical    Comment: tubal  Other Topics Concern   Not on file  Social History Narrative   Not on file   Social Drivers of Health   Financial Resource Strain: Low Risk  (06/25/2023)   Overall Financial Resource Strain (CARDIA)    Difficulty of Paying Living Expenses: Not very hard  Food Insecurity: No Food Insecurity (06/25/2023)   Hunger Vital Sign    Worried About Running Out of Food in the Last Year: Never true    Ran Out of Food in the Last Year: Never true  Transportation Needs: No Transportation Needs (06/25/2023)   PRAPARE - Administrator, Civil Service (Medical): No    Lack of Transportation (Non-Medical): No  Physical Activity: Insufficiently Active (06/25/2023)   Exercise Vital Sign    Days of Exercise per Week: 4 days    Minutes of Exercise per Session: 20 min  Stress: No Stress Concern Present (06/25/2023)  [employer] of Occupational Health - Occupational Stress Questionnaire    Feeling of Stress : Only a little  Social Connections: Moderately Integrated (06/25/2023)   Social Connection and Isolation Panel [NHANES]    Frequency of Communication with Friends and Family: More than three times a week    Frequency of Social Gatherings with Friends and Family: Once a week    Attends Religious Services: 1 to 4 times per year    Active Member of [employer] or Organizations: No    Attends Banker Meetings: Never    Marital Status: Married   Past Surgical History:  Procedure Laterality Date   APPENDECTOMY  2019   CHOLECYSTECTOMY     LAPAROSCOPIC APPENDECTOMY N/A 05/03/2018   Procedure: APPENDECTOMY LAPAROSCOPIC;  Surgeon: Gaynelle Adu, MD;  Location: [facility] OR;   Service: General;  Laterality: N/A;   TUBAL LIGATION     Past Medical History:  Diagnosis Date   Back pain    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, nerve impingment    Bipolar 1 disorder, mixed (HCC)    Depression    Fibromyalgia    Hair pulling    HSIL on Pap smear of cervix 2013   GYN- [facility]   Migraines    BP 112/80   Pulse (!) 104   Ht 5\' 4"  (1.626 m)   Wt 168 lb 9.6 oz (76.5 kg)   SpO2 100%   BMI 28.94 kg/m   Opioid Risk Score:   Fall Risk Score:  `1  Depression screen [facility] 2/9     12/07/2023   10:02 AM 06/26/2023    9:24 AM 06/19/2023   10:41 AM 06/04/2023   11:10 AM 04/02/2023    8:23 AM 01/02/2023    1:10 PM 12/26/2022    9:19 AM  Depression screen PHQ 2/9  Decreased Interest 0 0 0 1 1 0   Down, Depressed, Hopeless 0 0 0 0 2 0   PHQ - 2 Score 0 0 0 1 3 0   Altered sleeping  0 1 2 0 0 0  Tired, decreased energy  0 2 3 1  0 0  Change in appetite  1 2 1 1  0 0  Feeling bad or failure about yourself   0 0 0 1 0 0  Trouble concentrating  1 1 0 1 0 0  Moving slowly or fidgety/restless  1 1 0 2 0 0  Suicidal thoughts  0 0 0 0 0 0  PHQ-9 Score  3 7 7 9  0   Difficult doing work/chores  Not difficult at all  Somewhat difficult Somewhat difficult Not difficult at all Not difficult at all    Review of Systems  Constitutional:  Positive for chills, diaphoresis, fatigue, fever and unexpected weight change.  HENT: Negative.    Eyes: Negative.   Respiratory: Negative.    Cardiovascular: Negative.   Gastrointestinal:  Positive for constipation.  Endocrine: Negative.   Genitourinary: Negative.   Musculoskeletal:  Positive for arthralgias, back pain and myalgias.       Left hip pain especially at night  Skin: Negative.   Allergic/Immunologic: Negative.   Neurological:  Positive for tremors and weakness.       Tingling  Hematological: Negative.   Psychiatric/Behavioral:  Positive for dysphoric mood.   All other systems reviewed and are negative.      Objective:    Physical Exam PRIOR EXAM Gen: no distress, normal appearing HEENT: oral mucosa pink and moist, NCAT Cardio:  Reg rate Chest: normal effort, normal rate of breathing Abd: soft, non-distended Ext: no edema Psych: pleasant, normal affect Skin: intact Neuro: Alert and oriented x3      Assessment & Plan:   1) Right buttock pain: -discussed that this has improved  2) Jaw pain -discussed that this has improved  3) Fibromyalgia -discussed mechanism of action of low dose naltrexone as an opioid receptor antagonist which stimulates your body's production of its own natural endogenous opioids, helping to decrease pain. Discussed that it can also decrease T cell response and thus be helpful in decreasing inflammation, and symptoms of brain fog, fatigue, anxiety, depression, and allergies. Discussed that this medication needs to be compounded at a compounding pharmacy and can more expensive. Discussed that I usually start at 1mg  and if this is not providing enough relief then I titrate upward on a monthly basis.    -increase LDN to 4mg   -continue support groups  4) Left hip pain: -discussed MRI shows acute labral tear, medial tendinosis  Discussed extracorporeal shockwave therapy as a modality for treatment. Discussed that the device looks and feels like a massage gun and I would move it over the area of pain for about 10 minutes. The device releases sound waves to the area of pain and helps to improve blood flow and circulation to improve the healing process. Discuss that this initially induces inflammation and can sometimes cause short-term increase in pain. Discussed that we typically do three weekly treatments, but sometimes up to 6 if needed, and after 6 weeks long term benefits can sometimes be achieved. Discussed that this is an FDA approved device, but not covered by insurance and would cost $60 per session. Will scheduled patient for 6 consecutive appointments and can cancel latter three  if benefits are achieved after first three sessions.     -discussed minimal benefit from steroid injection -Turmeric to reduce inflammation--can be used in cooking or taken as a supplement. Turmeric to reduce inflammation--can be used in cooking or taken as a supplement.  Benefits of turmeric:  -Highly anti-inflammatory  -Increases antioxidants  -Improves memory, attention, brain disease  -Lowers risk of heart disease  -May help prevent cancer  -Decreases pain  -Alleviates depression  -Delays aging and decreases risk of chronic disease  -Consume with black pepper to increase absorption   Turmeric Milk Recipe:  1 cup milk  1 tsp turmeric  1 tsp cinnamon  1 tsp grated ginger (optional)  Black pepper (boosts the anti-inflammatory properties of turmeric).  1 tsp honey   5) Left plantar fascitis: Red light therapy  6) Food sensitivity headaches: Discussed food allergen testing

## 2024-01-01 ENCOUNTER — Encounter: Payer: Self-pay | Admitting: Physical Medicine and Rehabilitation

## 2024-01-04 ENCOUNTER — Encounter: Attending: Physical Medicine and Rehabilitation | Admitting: Physical Medicine and Rehabilitation

## 2024-01-04 DIAGNOSIS — M797 Fibromyalgia: Secondary | ICD-10-CM

## 2024-01-04 NOTE — Progress Notes (Signed)
 Subjective:    Patient ID: [name], female    DOB: 1981/08/21, 43 y.o.   MRN: [id number]  HPI An audio/video tele-health visit is felt to be the most appropriate encounter for this patient at this time. This is a follow up tele-visit via phone. The patient is at home. MD is at office. Prior to scheduling this appointment, our staff discussed the limitations of evaluation and management by telemedicine and the availability of in-person appointments. The patient expressed understanding and agreed to proceed.   [name] is a 43 year old woman who presents to establish care for fatigue, arthritis, and fevers  1) Jaw tightness -started three months ago after a very stressful event  2) Finger numbness -feels like bee stings  3) Left hip pain: -she felt it when playing volleyball with her daughter -discussed temporary benefit from steroid injection, 2nd steroid injection gave her 1 week of relief  4) Plantar fasciitis of left foot  5) Right buttock pain -feels like she is sitting on a knot  6) Food sensitivity headache: -she has headaches once or twice per week after lunch toward the late afternoon  7) Fatigue: LDN helps  8) Abdominal pain: -she notices in response to soybean oil  Pain Inventory Average Pain 4 Pain Right Now 8 My pain is intermittent, sharp, burning, and aching  In the last 24 hours, has pain interfered with the following? General activity 0 Relation with others 0 Enjoyment of life 0 What TIME of day is your pain at its worst?  Sleep (in general) Fair  Pain is worse with: bending and sitting and at night when sleeping Pain improves with: pacing activities, medication, and TENS and icy hot rubs Relief from Meds: 3     Family History  Problem Relation Age of Onset   Heart disease Mother    COPD Mother    Lung cancer Mother    Osteoporosis Mother    Alcohol abuse Father    Bipolar disorder Father    Prostate cancer Father    Melanoma  Paternal Grandmother    Arthritis/Rheumatoid Maternal Aunt    Social History   Socioeconomic History   Marital status: Married    Spouse name: Not on file   Number of children: 3   Years of education: Not on file   Highest education level: Associate degree: occupational, Scientist, product/process development, or vocational program  Occupational History   Not on file  Tobacco Use   Smoking status: Some Days    Current packs/day: 0.00    Types: Cigarettes    Last attempt to quit: 06/08/2019    Years since quitting: 4.5    Passive exposure: Past   Smokeless tobacco: Never  Vaping Use   Vaping status: Former  Substance and Sexual Activity   Alcohol use: Yes    Alcohol/week: 0.0 standard drinks of alcohol    Comment: occ   Drug use: No   Sexual activity: Yes    Birth control/protection: Surgical    Comment: tubal  Other Topics Concern   Not on file  Social History Narrative   Not on file   Social Drivers of Health   Financial Resource Strain: Low Risk  (06/25/2023)   Overall Financial Resource Strain (CARDIA)    Difficulty of Paying Living Expenses: Not very hard  Food Insecurity: No Food Insecurity (06/25/2023)   Hunger Vital Sign    Worried About Running Out of Food in the Last Year: Never true    Ran  Out of Food in the Last Year: Never true  Transportation Needs: No Transportation Needs (06/25/2023)   PRAPARE - Administrator, Civil Service (Medical): No    Lack of Transportation (Non-Medical): No  Physical Activity: Insufficiently Active (06/25/2023)   Exercise Vital Sign    Days of Exercise per Week: 4 days    Minutes of Exercise per Session: 20 min  Stress: No Stress Concern Present (06/25/2023)   [employer] of Occupational Health - Occupational Stress Questionnaire    Feeling of Stress : Only a little  Social Connections: Moderately Integrated (06/25/2023)   Social Connection and Isolation Panel [NHANES]    Frequency of Communication with Friends and Family: More than three times  a week    Frequency of Social Gatherings with Friends and Family: Once a week    Attends Religious Services: 1 to 4 times per year    Active Member of [employer] or Organizations: No    Attends Banker Meetings: Never    Marital Status: Married   Past Surgical History:  Procedure Laterality Date   APPENDECTOMY  2019   CHOLECYSTECTOMY     LAPAROSCOPIC APPENDECTOMY N/A 05/03/2018   Procedure: APPENDECTOMY LAPAROSCOPIC;  Surgeon: Gaynelle Adu, MD;  Location: [facility];  Service: General;  Laterality: N/A;   TUBAL LIGATION     Past Medical History:  Diagnosis Date   Back pain    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, nerve impingment    Bipolar 1 disorder, mixed (HCC)    Depression    Fibromyalgia    Hair pulling    HSIL on Pap smear of cervix 2013   GYN- [facility]   Migraines    There were no vitals taken for this visit.  Opioid Risk Score:   Fall Risk Score:  `1  Depression screen [facility] 2/9     12/07/2023   10:02 AM 06/26/2023    9:24 AM 06/19/2023   10:41 AM 06/04/2023   11:10 AM 04/02/2023    8:23 AM 01/02/2023    1:10 PM 12/26/2022    9:19 AM  Depression screen PHQ 2/9  Decreased Interest 0 0 0 1 1 0   Down, Depressed, Hopeless 0 0 0 0 2 0   PHQ - 2 Score 0 0 0 1 3 0   Altered sleeping  0 1 2 0 0 0  Tired, decreased energy  0 2 3 1  0 0  Change in appetite  1 2 1 1  0 0  Feeling bad or failure about yourself   0 0 0 1 0 0  Trouble concentrating  1 1 0 1 0 0  Moving slowly or fidgety/restless  1 1 0 2 0 0  Suicidal thoughts  0 0 0 0 0 0  PHQ-9 Score  3 7 7 9  0   Difficult doing work/chores  Not difficult at all  Somewhat difficult Somewhat difficult Not difficult at all Not difficult at all    Review of Systems  Constitutional:  Positive for chills, diaphoresis, fatigue, fever and unexpected weight change.  HENT: Negative.    Eyes: Negative.   Respiratory: Negative.    Cardiovascular: Negative.   Gastrointestinal:  Positive for constipation.  Endocrine: Negative.    Genitourinary: Negative.   Musculoskeletal:  Positive for arthralgias, back pain and myalgias.       Left hip pain especially at night  Skin: Negative.   Allergic/Immunologic: Negative.   Neurological:  Positive for tremors and weakness.  Tingling  Hematological: Negative.   Psychiatric/Behavioral:  Positive for dysphoric mood.   All other systems reviewed and are negative.      Objective:   Physical Exam PRIOR EXAM Gen: no distress, normal appearing HEENT: oral mucosa pink and moist, NCAT Cardio: Reg rate Chest: normal effort, normal rate of breathing Abd: soft, non-distended Ext: no edema Psych: pleasant, normal affect Skin: intact Neuro: Alert and oriented x3      Assessment & Plan:   1) Right buttock pain: -discussed that this has improved  2) Jaw pain -discussed that this has improved  3) Fibromyalgia -discussed mechanism of action of low dose naltrexone as an opioid receptor antagonist which stimulates your body's production of its own natural endogenous opioids, helping to decrease pain. Discussed that it can also decrease T cell response and thus be helpful in decreasing inflammation, and symptoms of brain fog, fatigue, anxiety, depression, and allergies. Discussed that this medication needs to be compounded at a compounding pharmacy and can more expensive. Discussed that I usually start at 1mg  and if this is not providing enough relief then I titrate upward on a monthly basis.    -increase LDN to 4mg , discussed that this has been working well for her  -continue support groups  4) Left hip pain: -discussed MRI shows acute labral tear, medial tendinosis  Discussed extracorporeal shockwave therapy as a modality for treatment. Discussed that the device looks and feels like a massage gun and I would move it over the area of pain for about 10 minutes. The device releases sound waves to the area of pain and helps to improve blood flow and circulation to improve  the healing process. Discuss that this initially induces inflammation and can sometimes cause short-term increase in pain. Discussed that we typically do three weekly treatments, but sometimes up to 6 if needed, and after 6 weeks long term benefits can sometimes be achieved. Discussed that this is an FDA approved device, but not covered by insurance and would cost $60 per session. Will scheduled patient for 6 consecutive appointments and can cancel latter three if benefits are achieved after first three sessions.     -discussed minimal benefit from steroid injection -Turmeric to reduce inflammation--can be used in cooking or taken as a supplement. Turmeric to reduce inflammation--can be used in cooking or taken as a supplement.  Benefits of turmeric:  -Highly anti-inflammatory  -Increases antioxidants  -Improves memory, attention, brain disease  -Lowers risk of heart disease  -May help prevent cancer  -Decreases pain  -Alleviates depression  -Delays aging and decreases risk of chronic disease  -Consume with black pepper to increase absorption   Turmeric Milk Recipe:  1 cup milk  1 tsp turmeric  1 tsp cinnamon  1 tsp grated ginger (optional)  Black pepper (boosts the anti-inflammatory properties of turmeric).  1 tsp honey   5) Left plantar fascitis: Red light therapy  6) Food sensitivity headaches: Discussed food allergen testing -continue soybean oil  7) Abdominal pain: -discussed avoiding soybean oil -recommending avoiding processed foods -recommended telling restaurants that she has an allergy to soybean oil  5 minutes spent in discussing that she is experiencing abdominal pain 2/2 soybean oil recommended avoiding processed foods, recommending telling restaurants that she has an allergy to soybean oil

## 2024-01-06 ENCOUNTER — Encounter: Payer: Self-pay | Admitting: Sports Medicine

## 2024-01-19 ENCOUNTER — Ambulatory Visit: Admitting: Sports Medicine

## 2024-01-19 ENCOUNTER — Encounter: Payer: Self-pay | Admitting: Sports Medicine

## 2024-01-19 DIAGNOSIS — S73192D Other sprain of left hip, subsequent encounter: Secondary | ICD-10-CM | POA: Diagnosis not present

## 2024-01-19 DIAGNOSIS — M25552 Pain in left hip: Secondary | ICD-10-CM

## 2024-01-19 DIAGNOSIS — S76012A Strain of muscle, fascia and tendon of left hip, initial encounter: Secondary | ICD-10-CM

## 2024-01-19 DIAGNOSIS — G8929 Other chronic pain: Secondary | ICD-10-CM

## 2024-01-19 MED ORDER — NITROGLYCERIN 0.2 MG/HR TD PT24: MEDICATED_PATCH | TRANSDERMAL | 0 refills | Status: DC

## 2024-01-19 NOTE — Progress Notes (Signed)
 [name] - 43 y.o. female MRN [id number]  Date of birth: April 03, 1981  Office Visit Note: Visit Date: 01/19/2024 PCP: Gabriel Earing, FNP Referred by: Gabriel Earing, FNP  Subjective: Chief Complaint  Patient presents with   Left Hip - Pain   HPI: [name] is a pleasant 43 y.o. female who presents today for acute on chronic left hip pain - lateral hip and anterior/groin.  Left hip -she has been having left hip pain since June.  She has been seeing Dr. Dallas Schimke for the hip and has had 2 previous landmark guided greater trochanteric injections.  Each 1 gave her about 1 week of relief that was pretty good and then her symptoms then returned.  Today, she tells me she has both hip pain about the lateral hip as well as over the anterior hip that radiates into the groin.  She has no numbness or tingling going down the leg.  Most of her pain goes from sitting to standing.  At times she feels that the anterior hip will give out on her secondary to the pain.  She has been doing some home exercises on her own with videos and it does feel slightly better after this but not long-lasting.  She uses ibuprofen and over-the-counter anti-inflammatories as needed.  Pertinent ROS were reviewed with the patient and found to be negative unless otherwise specified above in HPI.   Assessment & Plan: Visit Diagnoses:  1. Chronic left hip pain   2. Tear of left gluteus minimus tendon, initial encounter   3. Tear of left acetabular labrum, subsequent encounter    Plan: Impression is chronic left hip pain with 2 separate pathologies.  She does have chronic lateral hip pain with hip abductor weakness in the setting of moderate tendinopathy with small partial thickness undersurface tearing of her gluteus minimus.  She still has some weakness with hip abduction, we discussed trialing conservative treatment options to strengthen and improve the tendon quality.  We will proceed with nitroglycerin patch protocol  0.2 mg/h which she will change once daily.  Advised on risk/benefits/indications.  I also gave her my home hip abduction handout for home rehab she may begin.  Given that she has 2 different pathologies, I would like her to get started in formalized physical therapy for the left hip both with her gluteal insufficiency and her labral tear.  For the labral tear, we discussed referral for Dr. Steward Drone for possible surgical treatment, she would like to hold on this for now.  We will bring her back for an intra-articular hip injection to help with pain and allow her to progress through rehab at her convenience.  She is receiving shockwave therapy for the lateral hip as well, she may continue this.  Follow-up: Return for Schedule for L-hip injection at earliest convenience.   Meds & Orders:  Meds ordered this encounter  Medications   nitroGLYCERIN (NITRODUR - DOSED IN MG/24 HR) 0.2 mg/hr patch    Sig: Cut patch into fourths. Place 1/4 patch over affected area over lateral hip. Change every 24 hours.    Dispense:  21 patch    Refill:  0    Orders Placed This Encounter  Procedures   Ambulatory referral to Physical Therapy     Procedures: No procedures performed      Clinical History: No specialty comments available.  She reports that she has been smoking cigarettes. She has been exposed to tobacco smoke. She has never used smokeless  tobacco.  Recent Labs    04/02/23 0852  LABURIC 4.4    Objective:    Physical Exam  Gen: Well-appearing, in no acute distress; non-toxic CV: Well-perfused. Warm.  Resp: Breathing unlabored on room air; no wheezing. Psych: Fluid speech in conversation; appropriate affect; normal thought process  Ortho Exam - Left hip: + TTP over the greater trochanteric region.  There is fluid internal and external range of motion.  Markedly positive FADIR and Stinchfield testing, there is some pain associated with FABER testing.  There is pain with associated weakness with  resisted hip abduction, 4/5 strength compared to 5/5 strength of the contralateral hip.  Imaging:  *I personally reviewed and interpreted the left hip MRI from 11/08/2023 during the office visit today.  My interpretation does demonstrate a small labral tear from the anterior to superior direction.  There is also at least moderate tendinosis with a small degree of undersurface tearing without tendon retraction.  There is no significant arthritic change about the hip joint.  MR HIP LEFT WO CONTRAST CLINICAL DATA:  Chronic left hip pain.  EXAM: MR OF THE LEFT HIP WITHOUT CONTRAST  TECHNIQUE: Multiplanar, multisequence MR imaging was performed. No intravenous contrast was administered.  COMPARISON:  None Available.  FINDINGS: Bones:  No hip fracture, dislocation or avascular necrosis.  No periosteal reaction or bone destruction. No aggressive osseous lesion.  Normal sacrum and sacroiliac joints. No SI joint widening or erosive changes.  Articular cartilage and labrum  Articular cartilage:  No chondral defect.  Labrum:  Small left anterosuperior labral tear.  Joint or bursal effusion  Joint effusion:  No hip joint effusion.  No SI joint effusion.  Bursae:  No bursal fluid.  Muscles and tendons  Flexors: Normal.  Extensors: Normal.  Abductors: Normal.  Adductors: Normal.  Gluteals: Moderate tendinosis of the left gluteus minimus tendon insertion.  Hamstrings: Normal.  Other findings  No pelvic free fluid. No fluid collection or hematoma. No inguinal lymphadenopathy. No inguinal hernia.  IMPRESSION: 1. No hip fracture, dislocation or avascular necrosis. 2. Small left anterosuperior labral tear. 3. 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  Electronically Signed   By: Elige Ko M.D.   On: 11/16/2023 14:21  Past Medical/Family/Surgical/Social History: Medications & Allergies reviewed per EMR, new medications updated. 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   Recurrent fever of unknown cause 04/09/2023   Multiple skin nodules 04/09/2023   Positive double stranded DNA antibody test 04/09/2023   Rash and other nonspecific skin eruption 04/09/2023   Nausea without vomiting 11/26/2021   Elevated LFTs 11/26/2021   Mixed incontinence 03/17/2021   Hyperlipidemia 08/17/2020   ADD (attention deficit disorder) 01/29/2016   Migraines 09/18/2015   Lumbar back pain with radiculopathy affecting right lower extremity 07/31/2015   HSIL (high grade squamous intraepithelial lesion) on Pap smear of cervix 08/22/2013   Bipolar I disorder (HCC) 08/22/2013   Bulging lumbar disc 08/17/2012   Past Medical History:  Diagnosis Date   Back pain    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, nerve impingment    Bipolar 1 disorder, mixed (HCC)    Depression    Fibromyalgia    Hair pulling    HSIL on Pap smear of cervix 2013   GYN- [facility]   Migraines  Family History  Problem Relation Age of Onset   Heart disease Mother    COPD Mother    Lung cancer Mother    Osteoporosis Mother    Alcohol abuse Father    Bipolar disorder Father    Prostate cancer Father    Melanoma Paternal Grandmother    Arthritis/Rheumatoid Maternal Aunt    Past Surgical History:  Procedure Laterality Date   APPENDECTOMY  2019   CHOLECYSTECTOMY     LAPAROSCOPIC APPENDECTOMY N/A 05/03/2018   Procedure: APPENDECTOMY LAPAROSCOPIC;  Surgeon: Gaynelle Adu, MD;  Location: [facility] OR;  Service: General;  Laterality: N/A;   TUBAL LIGATION     Social History   Occupational History   Not on file  Tobacco Use   Smoking status: Some Days    Current packs/day: 0.00    Types: Cigarettes    Last attempt to quit: 06/08/2019    Years since  quitting: 4.6    Passive exposure: Past   Smokeless tobacco: Never  Vaping Use   Vaping status: Former  Substance and Sexual Activity   Alcohol use: Yes    Alcohol/week: 0.0 standard drinks of alcohol    Comment: occ   Drug use: No   Sexual activity: Yes    Birth control/protection: Surgical    Comment: tubal

## 2024-01-19 NOTE — Progress Notes (Signed)
 Patient says that she began having pain in her left hip in June. She has had 2 trochanteric bursa injections, both of which gave her about 1 week of relief. She says that she has the most pain when going from seated to standing, and when going from seated to standing also feels like her hip is giving out on her. She says that her pain is in the side of the hip, but she also has pain in the front of the hip. She says that she does not feel like she can push on it and get to it. Patient does exercises on her own at home using videos that she found online. She takes Ibuprofen twice daily as well as BC powder once daily. She has used heat which she says feels good, but did not provide any real relief.

## 2024-01-25 ENCOUNTER — Ambulatory Visit: Attending: Sports Medicine

## 2024-01-25 DIAGNOSIS — M25652 Stiffness of left hip, not elsewhere classified: Secondary | ICD-10-CM | POA: Diagnosis not present

## 2024-01-25 DIAGNOSIS — S73192D Other sprain of left hip, subsequent encounter: Secondary | ICD-10-CM | POA: Insufficient documentation

## 2024-01-25 DIAGNOSIS — M25552 Pain in left hip: Secondary | ICD-10-CM | POA: Diagnosis not present

## 2024-01-25 DIAGNOSIS — S76012A Strain of muscle, fascia and tendon of left hip, initial encounter: Secondary | ICD-10-CM | POA: Diagnosis not present

## 2024-01-25 DIAGNOSIS — M62838 Other muscle spasm: Secondary | ICD-10-CM | POA: Insufficient documentation

## 2024-01-25 DIAGNOSIS — G8929 Other chronic pain: Secondary | ICD-10-CM | POA: Insufficient documentation

## 2024-01-25 NOTE — Addendum Note (Signed)
 Addended by: Granville Lewis on: 01/25/2024 06:50 PM   Modules accepted: Orders

## 2024-01-25 NOTE — Therapy (Addendum)
 OUTPATIENT PHYSICAL THERAPY LOWER EXTREMITY EVALUATION   Patient Name: [name] MRN: [id number] DOB:1981/07/15, 43 y.o., female Today's Date: 01/25/2024  END OF SESSION:  PT End of Session - 01/25/24 1558     Visit Number 1    Number of Visits 8    Date for PT Re-Evaluation 03/18/24    PT Start Time 1345    PT Stop Time 1430    PT Time Calculation (min) 45 min             Past Medical History:  Diagnosis Date   Back pain    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, nerve impingment    Bipolar 1 disorder, mixed (HCC)    Depression    Fibromyalgia    Hair pulling    HSIL on Pap smear of cervix 2013   GYN- [facility] : [facility]   Migraines    Past Surgical History:  Procedure Laterality Date   APPENDECTOMY  2019   CHOLECYSTECTOMY     LAPAROSCOPIC APPENDECTOMY N/A 05/03/2018   Procedure: APPENDECTOMY LAPAROSCOPIC;  Surgeon: Gaynelle Adu, MD;  Location: [facility] OR;  Service: General;  Laterality: N/A;   TUBAL LIGATION     Patient Active Problem List   Diagnosis Date Noted   Encounter for screening fecal occult blood testing 06/19/2023   Encounter for well woman exam with routine gynecological exam 06/19/2023   Myalgic encephalomyelitis/chronic fatigue syndrome (ME/CFS) 04/28/2023   Recurrent fever of unknown cause 04/09/2023   Multiple skin nodules 04/09/2023   Positive double stranded DNA antibody test 04/09/2023   Rash and other nonspecific skin eruption 04/09/2023   Nausea without vomiting 11/26/2021   Elevated LFTs 11/26/2021   Mixed incontinence 03/17/2021   Hyperlipidemia 08/17/2020   ADD (attention deficit disorder) 01/29/2016   Migraines 09/18/2015   Lumbar back pain with radiculopathy affecting right lower extremity 07/31/2015   HSIL (high grade squamous intraepithelial lesion) on Pap smear of cervix 08/22/2013   Bipolar I disorder (HCC) 08/22/2013   Bulging lumbar disc 08/17/2012    PCP: Gabriel Earing, FNP  REFERRING PROVIDER: Madelyn Brunner, DO   REFERRING DIAG:  [phone] (ICD-10-CM) - Chronic left hip pain S76.012A (ICD-10-CM) - Tear of left gluteus minimus tendon, initial encounter S73.192D (ICD-10-CM) - Tear of left acetabular labrum, subsequent encounter  THERAPY DIAG:  Pain in left hip  Stiffness of left hip, not elsewhere classified  Rationale for Evaluation and Treatment: Rehabilitation  ONSET DATE: June 2024  SUBJECTIVE:   SUBJECTIVE STATEMENT: Patient reported left lateral hip and groin region pain. She recognized onset of pain in both regions upon a floor to stand transfer fon June, 2024. She reported lateral hip region pain is 8.5/10 at its worst but 0/10 when sitting in a chair, groin region pain is 1/10 when sitting in a chair but 6.5/10 at its worst. Pain limits her ability to work and perform ADL's. She stated that pain is most aggravated during sit to stand transfers, increased walking activity and when she internally rotates her left leg.   PERTINENT HISTORY: Fall risk, fibromyalgia, depression, migraines, ADD,  Bipolar 1 disorder, and current smoker PAIN:  Are you having pain? Yes: NPRS scale: 8.5/10 Left hip, 6.5/10 left anterior hip  Pain location: Left hip and groin pain.  Pain description: Dull deep aching pain in groin area. Sharp pain at lateral hip. Aggravating factors: Any rotation or getting up to walk after sitting.  Relieving factors: Sitting, lying on unaffected side   PRECAUTIONS: Fall  RED FLAGS:  None   WEIGHT BEARING RESTRICTIONS: No  FALLS:  Has patient fallen in last 6 months? Yes. Number of falls 1  LIVING ENVIRONMENT: Lives with: lives with their family Lives in: House/apartment Stairs: Yes: External: 2 steps on front porch 2 steps on back porch steps; bilateral rails on both front and back porch Has following equipment at home: None  OCCUPATION: Development worker, international aid at [employer]  PLOF: Independent  PATIENT GOALS: Be able to play volleyball again with her daughter, decrease pain, less difficulty  with work activities, improve movement, return to recreational activities/sports, improve strength, stand longer, less difficulty with home activities,   NEXT MD VISIT: Monday, February 01, 2024   OBJECTIVE:  Note: Objective measures were completed at Evaluation unless otherwise noted.  DIAGNOSTIC FINDINGS: 11/08/23 left hip MRI   IMPRESSION:  1. No hip fracture, dislocation or avascular necrosis. 2. Small left anterosuperior labral tear. 3. 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  PATIENT SURVEYS:  LEFS 47/80  COGNITION: Overall cognitive status:  WFL      SENSATION: WFL  POSTURE: No Significant postural limitations  PALPATION: TTP of the left greater trochanter.   LOWER EXTREMITY ROM:  Active ROM Right eval Left eval  Hip flexion 126 117 pain and discomfort  Hip extension    Hip abduction 40 30  Hip adduction    Hip internal rotation 45 38 pain and discomfort  Hip external rotation 35 32  Knee flexion    Knee extension    Ankle dorsiflexion    Ankle plantarflexion    Ankle inversion    Ankle eversion     (Blank rows = not tested)  LOWER EXTREMITY MMT:  MMT Right eval Left eval  Hip flexion 4 3+  Hip extension    Hip abduction 4+ 3+ slight discomfort in lateral hip region  Hip adduction    Hip internal rotation    Hip external rotation    Knee flexion    Knee extension    Ankle dorsiflexion    Ankle plantarflexion    Ankle inversion    Ankle eversion     (Blank rows = not tested)  LOWER EXTREMITY SPECIAL TESTS:  Hip special tests: Positive FADIR test  GAIT: Assistive device utilized: None Level of assistance: Complete Independence                                                                                                                               TREATMENT DATE:     PATIENT EDUCATION:  Education details: Patient educated on POC, objective findings, prognosis, and goals for PT.  Person educated:  Patient Education method: Explanation Education comprehension: verbalized understanding  HOME EXERCISE PROGRAM:   ASSESSMENT:  CLINICAL IMPRESSION: Patient is a 43 y.o. female who was seen today for physical therapy evaluation and treatment for left lateral hip and groin region pain. Objective findings of left hip region include weakness with hip flexion and abduction,  TTP of the greater trochanter, a positive FADIR test, hip flexion and internal rotation AROM. Patient would benefit from skilled physical therapy in order to address her impairments to be able to play volleyball with her daughter again and perform work duties.   OBJECTIVE IMPAIRMENTS: decreased activity tolerance, decreased mobility, difficulty walking, decreased ROM, decreased strength, decreased safety awareness, impaired perceived functional ability, impaired flexibility, and pain.   ACTIVITY LIMITATIONS: carrying, lifting, bending, sitting, standing, squatting, sleeping, stairs, and caring for others  PARTICIPATION LIMITATIONS: driving, community activity, and occupation  PERSONAL FACTORS: Time since onset of injury/illness/exacerbation and 3+ comorbidities: Fibromyalgia, Depression, Bipolar Disorder (HCC), ADD  are also affecting patient's functional outcome.   REHAB POTENTIAL: Good  CLINICAL DECISION MAKING: Evolving/moderate complexity  EVALUATION COMPLEXITY: Moderate   GOALS: Goals reviewed with patient? No  SHORT TERM GOALS: Target date: 02/15/2024 Patient will be independent with her HEP.  Baseline: Goal status: INITIAL  2.  Patient's LEFS will increase from 47 to 56, enabling her to have better functional mobility.   Baseline:  Goal status: INITIAL  3.  Patient's left lateral hip pain at its worst will decrease from 8.5 to 6.5 enabling her to better perform sit to stand transfers.  Baseline:  Goal status: INITIAL  4.  Patient's left hip flexion will increase to at least 126 degrees, enabling her to  more easily transfer in and out of the shower.   Baseline:  Goal status: INITIAL  LONG TERM GOALS: Target date: 03/07/2024  Patient's LEFS will increase from 56 to 65, enabling her to have better functional mobility.  Baseline:  Goal status: INITIAL  2.  Patients will be independent with advanced HEP.  Baseline:  Goal status: INITIAL  3.  Patients left hip pain at its worst decrease from 6.5 to 4.5 enabling her to better perform sit to stand transfers. Baseline:  Goal status: INITIAL  4.  Patient's left hip abduction strength will increase to at least 4+/5, enabling her to walk longer.  Baseline:  Goal status: INITIAL  5.  Patient will be able to at least 25 minutes without being limited by her familiar hip symptoms for improved function participating in activities with her daughter. Baseline:  Goal status: INITIAL  PLAN:  PT FREQUENCY: 1-2x/week  PT DURATION: 6 weeks  PLANNED INTERVENTIONS: 97164- PT Re-evaluation, 97110-Therapeutic exercises, 97530- Therapeutic activity, [id number]- Neuromuscular re-education, 97535- Self Care, 09604- Manual therapy, [phone]- Gait training, [phone]- Electrical stimulation (unattended), Patient/Family education, Balance training, Stair training, Dry Needling, Joint mobilization, and Moist heat  PLAN FOR NEXT SESSION: LE strengthening, joint mobilizations, modalities as needed.    [name], Student-PT 01/25/2024, 6:11 PM

## 2024-01-26 ENCOUNTER — Encounter
Payer: Commercial Managed Care - PPO | Attending: Physical Medicine and Rehabilitation | Admitting: Physical Medicine and Rehabilitation

## 2024-01-26 DIAGNOSIS — M797 Fibromyalgia: Secondary | ICD-10-CM | POA: Insufficient documentation

## 2024-02-01 ENCOUNTER — Ambulatory Visit: Admitting: Sports Medicine

## 2024-02-01 DIAGNOSIS — F429 Obsessive-compulsive disorder, unspecified: Secondary | ICD-10-CM | POA: Diagnosis not present

## 2024-02-01 DIAGNOSIS — F902 Attention-deficit hyperactivity disorder, combined type: Secondary | ICD-10-CM | POA: Diagnosis not present

## 2024-02-01 DIAGNOSIS — F331 Major depressive disorder, recurrent, moderate: Secondary | ICD-10-CM | POA: Diagnosis not present

## 2024-02-01 DIAGNOSIS — F411 Generalized anxiety disorder: Secondary | ICD-10-CM | POA: Diagnosis not present

## 2024-02-02 ENCOUNTER — Encounter

## 2024-02-02 ENCOUNTER — Encounter: Payer: Commercial Managed Care - PPO | Admitting: Physical Medicine and Rehabilitation

## 2024-02-08 ENCOUNTER — Ambulatory Visit: Admitting: Physical Therapy

## 2024-02-08 ENCOUNTER — Encounter: Payer: Self-pay | Admitting: Physical Therapy

## 2024-02-08 DIAGNOSIS — M25552 Pain in left hip: Secondary | ICD-10-CM | POA: Diagnosis not present

## 2024-02-08 DIAGNOSIS — M25652 Stiffness of left hip, not elsewhere classified: Secondary | ICD-10-CM | POA: Diagnosis not present

## 2024-02-08 DIAGNOSIS — M62838 Other muscle spasm: Secondary | ICD-10-CM | POA: Diagnosis not present

## 2024-02-08 DIAGNOSIS — S73192D Other sprain of left hip, subsequent encounter: Secondary | ICD-10-CM | POA: Diagnosis not present

## 2024-02-08 DIAGNOSIS — S76012A Strain of muscle, fascia and tendon of left hip, initial encounter: Secondary | ICD-10-CM | POA: Diagnosis not present

## 2024-02-08 DIAGNOSIS — G8929 Other chronic pain: Secondary | ICD-10-CM | POA: Diagnosis not present

## 2024-02-08 NOTE — Therapy (Signed)
 OUTPATIENT PHYSICAL THERAPY LOWER EXTREMITY EVALUATION   Patient Name: [name] MRN: [id number] DOB:12-27-1980, 43 y.o., female Today's Date: 02/08/2024  END OF SESSION:  PT End of Session - 02/08/24 1527     Visit Number 2    Number of Visits 8    Date for PT Re-Evaluation 03/18/24    PT Start Time 0146    PT Stop Time 0240    PT Time Calculation (min) 54 min    Activity Tolerance Patient tolerated treatment well    Behavior During Therapy [facility] for tasks assessed/performed              Past Medical History:  Diagnosis Date   Back pain    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, nerve impingment    Bipolar 1 disorder, mixed (HCC)    Depression    Fibromyalgia    Hair pulling    HSIL on Pap smear of cervix 2013   GYN- [facility]   Migraines    Past Surgical History:  Procedure Laterality Date   APPENDECTOMY  2019   CHOLECYSTECTOMY     LAPAROSCOPIC APPENDECTOMY N/A 05/03/2018   Procedure: APPENDECTOMY LAPAROSCOPIC;  Surgeon: Aldean Hummingbird, MD;  Location: [facility] OR;  Service: General;  Laterality: N/A;   TUBAL LIGATION     Patient Active Problem List   Diagnosis Date Noted   Encounter for screening fecal occult blood testing 06/19/2023   Encounter for well woman exam with routine gynecological exam 06/19/2023   Myalgic encephalomyelitis/chronic fatigue syndrome (ME/CFS) 04/28/2023   Recurrent fever of unknown cause 04/09/2023   Multiple skin nodules 04/09/2023   Positive double stranded DNA antibody test 04/09/2023   Rash and other nonspecific skin eruption 04/09/2023   Nausea without vomiting 11/26/2021   Elevated LFTs 11/26/2021   Mixed incontinence 03/17/2021   Hyperlipidemia 08/17/2020   ADD (attention deficit disorder) 01/29/2016   Migraines 09/18/2015   Lumbar back pain with radiculopathy affecting right lower extremity 07/31/2015   HSIL (high grade squamous intraepithelial lesion) on Pap smear of cervix 08/22/2013   Bipolar I disorder (HCC) 08/22/2013    Bulging lumbar disc 08/17/2012    PCP: Albertha Huger, FNP  REFERRING PROVIDER: Shauna Del, DO   REFERRING DIAG: [phone] (ICD-10-CM) - Chronic left hip pain S76.012A (ICD-10-CM) - Tear of left gluteus minimus tendon, initial encounter S73.192D (ICD-10-CM) - Tear of left acetabular labrum, subsequent encounter  THERAPY DIAG:  Pain in left hip  Stiffness of left hip, not elsewhere classified  Other muscle spasm  Rationale for Evaluation and Treatment: Rehabilitation  ONSET DATE: June 2024  SUBJECTIVE:   SUBJECTIVE STATEMENT: Pain low right now (1/10) but certain movements can produce intense pain.   PERTINENT HISTORY: Fall risk, fibromyalgia, depression, migraines, ADD,  Bipolar 1 disorder, and current smoker PAIN:  Are you having pain? Yes: NPRS scale: 8.5/10 Left hip, 6.5/10 left anterior hip  Pain location: Left hip and groin pain.  Pain description: Dull deep aching pain in groin area. Sharp pain at lateral hip. Aggravating factors: Any rotation or getting up to walk after sitting.  Relieving factors: Sitting, lying on unaffected side   PRECAUTIONS: Fall  RED FLAGS: None   WEIGHT BEARING RESTRICTIONS: No  FALLS:  Has patient fallen in last 6 months? Yes. Number of falls 1  LIVING ENVIRONMENT: Lives with: lives with their family Lives in: House/apartment Stairs: Yes: External: 2 steps on front porch 2 steps on back porch steps; bilateral rails on both front and back  porch Has following equipment at home: None  OCCUPATION: Development worker, international aid at [employer]  PLOF: Independent  PATIENT GOALS: Be able to play volleyball again with her daughter, decrease pain, less difficulty with work activities, improve movement, return to recreational activities/sports, improve strength, stand longer, less difficulty with home activities,   NEXT MD VISIT: Monday, February 01, 2024   OBJECTIVE:  Note: Objective measures were completed at Evaluation unless otherwise  noted.  DIAGNOSTIC FINDINGS: 11/08/23 left hip MRI   IMPRESSION:  1. No hip fracture, dislocation or avascular necrosis. 2. Small left anterosuperior labral tear. 3. 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  PATIENT SURVEYS:  LEFS 47/80  COGNITION: Overall cognitive status:  WFL      SENSATION: WFL  POSTURE: No Significant postural limitations  PALPATION: TTP of the left greater trochanter.   LOWER EXTREMITY ROM:  Active ROM Right eval Left eval  Hip flexion 126 117 pain and discomfort  Hip extension    Hip abduction 40 30  Hip adduction    Hip internal rotation 45 38 pain and discomfort  Hip external rotation 35 32  Knee flexion    Knee extension    Ankle dorsiflexion    Ankle plantarflexion    Ankle inversion    Ankle eversion     (Blank rows = not tested)  LOWER EXTREMITY MMT:  MMT Right eval Left eval  Hip flexion 4 3+  Hip extension    Hip abduction 4+ 3+ slight discomfort in lateral hip region  Hip adduction    Hip internal rotation    Hip external rotation    Knee flexion    Knee extension    Ankle dorsiflexion    Ankle plantarflexion    Ankle inversion    Ankle eversion     (Blank rows = not tested)  LOWER EXTREMITY SPECIAL TESTS:  Hip special tests: Positive FADIR test  GAIT: Assistive device utilized: None Level of assistance: Complete Independence                                                                                                                               TREATMENT DATE:  02/08/24:  Nustep level 3 x 11 minutes f/b STW/M x 12 minutes to patient's left lateral hip musculature in right SDLY position with folded pillow between knees for comfort f/b  HMP and IFC at 80-150 Hz on 40% scan x 20 minutes.  Normal modality response following removal of modality.  PATIENT EDUCATION:  Education details: Patient educated on POC, objective findings, prognosis, and goals for PT.  Person educated:  Patient Education method: Explanation Education comprehension: verbalized understanding  HOME EXERCISE PROGRAM:   ASSESSMENT:  CLINICAL IMPRESSION: The patient presented to the clinic with a low pain-level.  She exhibited left lateral hip musculature tenderness and tolerated STW/M very well and felt better following.    OBJECTIVE IMPAIRMENTS: decreased activity tolerance, decreased mobility, difficulty walking, decreased ROM, decreased  strength, decreased safety awareness, impaired perceived functional ability, impaired flexibility, and pain.   ACTIVITY LIMITATIONS: carrying, lifting, bending, sitting, standing, squatting, sleeping, stairs, and caring for others  PARTICIPATION LIMITATIONS: driving, community activity, and occupation  PERSONAL FACTORS: Time since onset of injury/illness/exacerbation and 3+ comorbidities: Fibromyalgia, Depression, Bipolar Disorder (HCC), ADD  are also affecting patient's functional outcome.   REHAB POTENTIAL: Good  CLINICAL DECISION MAKING: Evolving/moderate complexity  EVALUATION COMPLEXITY: Moderate   GOALS: Goals reviewed with patient? No  SHORT TERM GOALS: Target date: 02/15/2024 Patient will be independent with her HEP.  Baseline: Goal status: INITIAL  2.  Patient's LEFS will increase from 47 to 56, enabling her to have better functional mobility.   Baseline:  Goal status: INITIAL  3.  Patient's left lateral hip pain at its worst will decrease from 8.5 to 6.5 enabling her to better perform sit to stand transfers.  Baseline:  Goal status: INITIAL  4.  Patient's left hip flexion will increase to at least 126 degrees, enabling her to more easily transfer in and out of the shower.   Baseline:  Goal status: INITIAL  LONG TERM GOALS: Target date: 03/07/2024  Patient's LEFS will increase from 56 to 65, enabling her to have better functional mobility.  Baseline:  Goal status: INITIAL  2.  Patients will be independent with advanced HEP.   Baseline:  Goal status: INITIAL  3.  Patients left hip pain at its worst decrease from 6.5 to 4.5 enabling her to better perform sit to stand transfers. Baseline:  Goal status: INITIAL  4.  Patient's left hip abduction strength will increase to at least 4+/5, enabling her to walk longer.  Baseline:  Goal status: INITIAL  5.  Patient will be able to at least 25 minutes without being limited by her familiar hip symptoms for improved function participating in activities with her daughter. Baseline:  Goal status: INITIAL  PLAN:  PT FREQUENCY: 1-2x/week  PT DURATION: 6 weeks  PLANNED INTERVENTIONS: 97164- PT Re-evaluation, 97110-Therapeutic exercises, 97530- Therapeutic activity, [id number]- Neuromuscular re-education, 97535- Self Care, 54098- Manual therapy, [phone]- Gait training, [phone]- Electrical stimulation (unattended), Patient/Family education, Balance training, Stair training, Dry Needling, Joint mobilization, and Moist heat  PLAN FOR NEXT SESSION: LE strengthening, joint mobilizations, modalities as needed.    Jacayla Nordell, [place], PT 02/08/2024, 3:28 PM

## 2024-02-09 ENCOUNTER — Encounter: Payer: Commercial Managed Care - PPO | Admitting: Physical Medicine and Rehabilitation

## 2024-02-15 ENCOUNTER — Encounter: Admitting: Physical Therapy

## 2024-02-15 ENCOUNTER — Other Ambulatory Visit: Payer: Self-pay | Admitting: Sports Medicine

## 2024-02-16 ENCOUNTER — Encounter: Payer: Commercial Managed Care - PPO | Admitting: Physical Medicine and Rehabilitation

## 2024-02-19 ENCOUNTER — Other Ambulatory Visit: Payer: Self-pay

## 2024-02-19 ENCOUNTER — Ambulatory Visit: Admitting: Sports Medicine

## 2024-02-19 ENCOUNTER — Encounter: Payer: Self-pay | Admitting: Sports Medicine

## 2024-02-19 DIAGNOSIS — M25552 Pain in left hip: Secondary | ICD-10-CM

## 2024-02-19 DIAGNOSIS — S73192D Other sprain of left hip, subsequent encounter: Secondary | ICD-10-CM | POA: Diagnosis not present

## 2024-02-19 DIAGNOSIS — G8929 Other chronic pain: Secondary | ICD-10-CM

## 2024-02-19 MED ORDER — METHYLPREDNISOLONE ACETATE 40 MG/ML IJ SUSP
40.0000 mg | INTRAMUSCULAR | Status: AC | PRN
Start: 2024-02-19 — End: 2024-02-19
  Administered 2024-02-19: 40 mg via INTRA_ARTICULAR

## 2024-02-19 MED ORDER — LIDOCAINE HCL 1 % IJ SOLN
4.0000 mL | INTRAMUSCULAR | Status: AC | PRN
Start: 2024-02-19 — End: 2024-02-19
  Administered 2024-02-19: 4 mL

## 2024-02-19 NOTE — Progress Notes (Signed)
   Procedure Note  Patient: [name]             Date of Birth: 06-May-1981           MRN: [id number]             Visit Date: 02/19/2024  Procedures: Visit Diagnoses:  1. Chronic left hip pain   2. Tear of left acetabular labrum, subsequent encounter    Large Joint Inj: L hip joint on 02/19/2024 1:44 PM Indications: pain Details: 22 G 3.5 in needle, ultrasound-guided anterior approach Medications: 4 mL lidocaine  1 %; 40 mg methylPREDNISolone  acetate 40 MG/ML Outcome: tolerated well, no immediate complications  Procedure: US -guided intra-articular hip injection, left After discussion on risks/benefits/indications and informed verbal consent was obtained, a timeout was performed. Patient was lying supine on exam table. The hip was cleaned with betadine and alcohol swabs. Then utilizing ultrasound guidance, the patient's femoral head and neck junction was identified and subsequently injected with 4:1 lidocaine :depomedrol via an in-plane approach with ultrasound visualization of the injectate administered into the hip joint. Patient tolerated procedure well without immediate complications.  Procedure, treatment alternatives, risks and benefits explained, specific risks discussed. Consent was given by the patient. Immediately prior to procedure a time out was called to verify the correct patient, procedure, equipment, support staff and site/side marked as required. Patient was prepped and draped in the usual sterile fashion.     - Advised on postinjection protocol for the hip, may use ice/heat or over-the-counter anti-inflammatories as needed - f/u in 6 weeks for both hip (labrum) and gluteal tendons of lateral hip  Shauna Del, DO Primary Care Sports Medicine Physician  [facility] - Orthopedics  This note was dictated using Dragon naturally speaking software and may contain errors in syntax, spelling, or content which have not been identified prior to signing this note.

## 2024-02-23 ENCOUNTER — Encounter
Payer: Commercial Managed Care - PPO | Attending: Physical Medicine and Rehabilitation | Admitting: Physical Medicine and Rehabilitation

## 2024-02-23 DIAGNOSIS — M797 Fibromyalgia: Secondary | ICD-10-CM | POA: Insufficient documentation

## 2024-02-23 NOTE — Progress Notes (Deleted)
  PROCEDURE RECORD [facility]   Name: NICOL USREY DOB:May 27, 1981 MRN: [id number]  Date:02/23/2024  Physician: {CPRM-PROVIDERS:[id number]}    Nurse/CMA: ***  Allergies: No Known Allergies  Consent Signed: {yes NW:295621}  Is patient diabetic? {yes no:314532}  CBG today? ***  Pregnant: {yes no:314532} LMP: No LMP recorded. (age 42-55)  Anticoagulants: {Yes/No:19989} Anti-inflammatory: {Yes/No:19989} Antibiotics: {Yes/No:19989}  Procedure: ***  Position: {PRONE/SUPINE/SITTING/LATERAL:21022916} Start Time: ***  End Time: ***  Fluoro Time: ***  RN/CMA *** ***    Time *** ***    BP *** ***    Pulse *** ***    Respirations *** ***    O2 Sat *** ***    S/S *** ***    Pain Level *** ***     D/C home with ***, patient A & O X 3, D/C instructions reviewed, and sits independently.         Subjective:    Patient ID: MAKINLEIGH TILDEN, female    DOB: 1981/08/14, 43 y.o.   MRN: [id number]  HPI   .CPR Review of Systems     Objective:   Physical Exam        Assessment & Plan:

## 2024-03-01 ENCOUNTER — Encounter: Payer: Commercial Managed Care - PPO | Admitting: Physical Medicine and Rehabilitation

## 2024-04-04 ENCOUNTER — Encounter: Payer: Self-pay | Admitting: Sports Medicine

## 2024-04-04 ENCOUNTER — Ambulatory Visit: Admitting: Sports Medicine

## 2024-04-19 DIAGNOSIS — F902 Attention-deficit hyperactivity disorder, combined type: Secondary | ICD-10-CM | POA: Diagnosis not present

## 2024-04-19 DIAGNOSIS — F429 Obsessive-compulsive disorder, unspecified: Secondary | ICD-10-CM | POA: Diagnosis not present

## 2024-04-19 DIAGNOSIS — F331 Major depressive disorder, recurrent, moderate: Secondary | ICD-10-CM | POA: Diagnosis not present

## 2024-04-19 DIAGNOSIS — F411 Generalized anxiety disorder: Secondary | ICD-10-CM | POA: Diagnosis not present

## 2024-04-21 ENCOUNTER — Encounter: Payer: Self-pay | Admitting: Family Medicine

## 2024-05-02 ENCOUNTER — Telehealth (INDEPENDENT_AMBULATORY_CARE_PROVIDER_SITE_OTHER): Payer: Self-pay | Admitting: Family Medicine

## 2024-05-02 ENCOUNTER — Encounter: Payer: Self-pay | Admitting: Family Medicine

## 2024-05-02 DIAGNOSIS — G43709 Chronic migraine without aura, not intractable, without status migrainosus: Secondary | ICD-10-CM

## 2024-05-02 MED ORDER — SUMATRIPTAN SUCCINATE 100 MG PO TABS
ORAL_TABLET | ORAL | 11 refills | Status: AC
Start: 2024-05-02 — End: ?

## 2024-05-02 NOTE — Progress Notes (Signed)
   Virtual Visit via video Note   Due to COVID-19 pandemic this visit was conducted virtually. This visit type was conducted due to national recommendations for restrictions regarding the COVID-19 Pandemic (e.g. social distancing, sheltering in place) in an effort to limit this patient's exposure and mitigate transmission in our community. All issues noted in this document were discussed and addressed.  A physical exam was not performed with this format.  I connected with  [name]  on 05/02/24 at 1305 by video and verified that I am speaking with the correct person using two identifiers. [name] is currently located at home and no one is currently with her during the visit. The provider, Annabella CHRISTELLA Search, FNP is located in their office at time of visit.  I discussed the limitations, risks, security and privacy concerns of performing an evaluation and management service by video  and the availability of in person appointments. I also discussed with the patient that there may be a patient responsible charge related to this service. The patient expressed understanding and agreed to proceed.  CC: migraine  History and Present Illness:  [name] reports increased migraines for the last 2 weeks. She has had 1-2 migraines a week recently. Typically migraine is unilateral with throbbing pain and noise sensitivity. Pain is worse with activity and improves with rest, dark room, sleep. She has been taking Imitrex  with good relief but has been out for the last few weeks. She is requesting a refill today. No focal weakness, visual disturbances, nausea, or vomiting.    ROS As per HPI.    Observations/Objective: Alert and oriented. Respirations unlabored. No cyanosis. Non toxic appearing. Normal mood and behavior.     Assessment and Plan: [name] was seen today for migraine.  Diagnoses and all orders for this visit:  Chronic migraine w/o aura w/o status migrainosus, not intractable Imitrex   prn as below.  -     SUMAtriptan  (IMITREX ) 100 MG tablet; Take one tablet by mouth at onset of migraine. May repeat in 2 hours if headache persists or recurs.     Follow Up Instructions: Return to office for new or worsening symptoms, or if symptoms persist.     I discussed the assessment and treatment plan with the patient. The patient was provided an opportunity to ask questions and all were answered. The patient agreed with the plan and demonstrated an understanding of the instructions.   The patient was advised to call back or seek an in-person evaluation if the symptoms worsen or if the condition fails to improve as anticipated.  The above assessment and management plan was discussed with the patient. The patient verbalized understanding of and has agreed to the management plan. Patient is aware to call the clinic if symptoms persist or worsen. Patient is aware when to return to the clinic for a follow-up visit. Patient educated on when it is appropriate to go to the emergency department.   Time call ended: 1311  I provided 6 minutes of face-to-face time during this encounter.    Annabella CHRISTELLA Search, FNP

## 2024-05-03 ENCOUNTER — Encounter: Payer: Self-pay | Admitting: Family Medicine

## 2024-05-16 ENCOUNTER — Ambulatory Visit: Admitting: Nurse Practitioner

## 2024-05-16 ENCOUNTER — Encounter: Payer: Self-pay | Admitting: Nurse Practitioner

## 2024-05-16 VITALS — BP 112/78 | HR 118 | Temp 98.1°F | Ht 64.0 in | Wt 158.2 lb

## 2024-05-16 DIAGNOSIS — M2550 Pain in unspecified joint: Secondary | ICD-10-CM | POA: Diagnosis not present

## 2024-05-16 DIAGNOSIS — M5416 Radiculopathy, lumbar region: Secondary | ICD-10-CM | POA: Diagnosis not present

## 2024-05-16 NOTE — Progress Notes (Signed)
 Acute Office Visit  Subjective:     Patient ID: [name], female    DOB: 1980/12/02, 43 y.o.   MRN: [id number]  Chief Complaint  Patient presents with   Nausea    Comes and goes   Leg Pain    Pain in both legs mainly at night its the worse, but it is in the day too patient states feels like water  runs down legs. Feels like hair stuck in toes. Could not feel cold water  with hands     HPI [name] is a 43 year old female presenting on 05/16/2024 for an acute visit with concerns of bilateral leg pain. She reports a long history of this issue, stating, I dread going to bed at night because it hurts so bad. She underwent back surgery in August 2019 to address pain in the lower right leg, which initially improved but returned a few weeks post-surgery and has since progressed to involve both legs. She also reports sensory changes, including inability to feel cold water  I asked my husband why the water  was so hot, and he came over and said the water  was cold. She has experienced numbness and tingling in both hands. She denies shortness of breath or chest pain.  [name] has a diagnosis of polyarthralgia and reports prior rheumatology evaluation. She was referred to physical rehabilitation but has not followed up in the past 7 months due to a demanding work schedule. She was scheduled for injection therapy but never returned for treatment. She has discontinued her medications, including Wellbutrin  and Effexor .  She has a history of crochet since 43 years old. Active Ambulatory Problems    Diagnosis Date Noted   Bulging lumbar disc 08/17/2012   HSIL (high grade squamous intraepithelial lesion) on Pap smear of cervix 08/22/2013   Bipolar I disorder (HCC) 08/22/2013   Lumbar back pain with radiculopathy affecting right lower extremity 07/31/2015   Migraines 09/18/2015   ADD (attention deficit disorder) 01/29/2016   Hyperlipidemia 08/17/2020   Mixed incontinence 03/17/2021   Nausea  without vomiting 11/26/2021   Elevated LFTs 11/26/2021   Recurrent fever of unknown cause 04/09/2023   Multiple skin nodules 04/09/2023   Positive double stranded DNA antibody test 04/09/2023   Rash and other nonspecific skin eruption 04/09/2023   Myalgic encephalomyelitis/chronic fatigue syndrome (ME/CFS) 04/28/2023   Encounter for screening fecal occult blood testing 06/19/2023   Encounter for well woman exam with routine gynecological exam 06/19/2023   Polyarthralgia 05/16/2024   Resolved Ambulatory Problems    Diagnosis Date Noted   Cystitis 09/30/2012   Gastroenteritis 11/15/2012   Anxiety state 03/02/2013   Tick bite 03/30/2013   Pain, joint, multiple sites 05/05/2013   Neck muscle spasm 05/05/2013   Paresthesia of skin 05/05/2013   Palpitations 06/30/2016   Acute appendicitis 05/03/2018   Past Medical History:  Diagnosis Date   Back pain    Bipolar 1 disorder, mixed (HCC)    Depression    Fibromyalgia    Hair pulling    HSIL on Pap smear of cervix 2013     Review of Systems  Constitutional:  Negative for chills and fever.  HENT:  Negative for ear pain and sore throat.   Respiratory:  Negative for cough and shortness of breath.   Cardiovascular:  Negative for chest pain and leg swelling.  Musculoskeletal:        Numbness and tingling in upper and lower extremities  Skin:  Negative for itching and rash.  Neurological:  Positive for tingling.       Bilateral upper and lower extremity  Psychiatric/Behavioral:  Negative for depression and suicidal ideas. The patient does not have insomnia.    Negative unless indicated in HPI    Objective:    BP 112/78   Pulse (!) 118   Temp 98.1 F (36.7 C) (Temporal)   Ht 5' 4 (1.626 m)   Wt 158 lb 3.2 oz (71.8 kg)   SpO2 100%   BMI 27.15 kg/m  BP Readings from Last 3 Encounters:  05/16/24 112/78  12/07/23 112/80  11/04/23 120/79   Wt Readings from Last 3 Encounters:  05/16/24 158 lb 3.2 oz (71.8 kg)  12/07/23 168  lb 9.6 oz (76.5 kg)  11/04/23 168 lb (76.2 kg)      Physical Exam Vitals and nursing note reviewed.  Constitutional:      General: She is not in acute distress. HENT:     Head: Normocephalic and atraumatic.     Nose: Nose normal.     Mouth/Throat:     Mouth: Mucous membranes are moist.  Eyes:     General: No scleral icterus.    Extraocular Movements: Extraocular movements intact.     Conjunctiva/sclera: Conjunctivae normal.     Pupils: Pupils are equal, round, and reactive to light.  Cardiovascular:     Heart sounds: Normal heart sounds.  Pulmonary:     Effort: Pulmonary effort is normal.     Breath sounds: Normal breath sounds.  Musculoskeletal:        General: Normal range of motion.     Right lower leg: No edema.     Left lower leg: No edema.     Comments: Pulse +2 bilateral lower extremity  Skin:    General: Skin is warm and dry.     Capillary Refill: Capillary refill takes less than 2 seconds.  Neurological:     Mental Status: She is alert and oriented to person, place, and time.  Psychiatric:        Attention and Perception: Attention and perception normal.        Mood and Affect: Affect is flat.        Speech: Speech normal.        Behavior: Behavior normal. Behavior is cooperative.        Thought Content: Thought content normal. Thought content does not include homicidal or suicidal ideation. Thought content does not include homicidal or suicidal plan.        Cognition and Memory: Cognition and memory normal.        Judgment: Judgment normal.    Pertinent labs & imaging results that were available during my care of the patient were reviewed by me and considered in my medical decision making.  No results found for any visits on 05/16/24.      Assessment & Plan:  Lumbar back pain with radiculopathy affecting right lower extremity  Polyarthralgia  Juanda is a 43 year old Caucasian female seen today for polyarthralgia, no acute distress Resumed gabapentin  as  prescribed Talk to psych about restarted medication  -Physical Rehabilitation:Resume treatment with physical therapy  for evaluation and management of polyarthralgia, to help manage symptoms and improve function.  Patient Education: Educate on importance of follow-up care, medication adherence, and symptom monitoring. - Encourage adherence to follow-up despite work schedule; discuss telehealth options if available.  -Discuss ergonomic adjustments and strategies to minimize symptom aggravation related to activities such as crochet.  Future: may need to been seen by  rheumatology again The above assessment and management plan was discussed with the patient. The patient verbalized understanding of and has agreed to the management plan. Patient is aware to call the clinic if they develop any new symptoms or if symptoms persist or worsen. Patient is aware when to return to the clinic for a follow-up visit. Patient educated on when it is appropriate to go to the emergency department.  Return in about 6 weeks (around 06/27/2024) for with PCP.  Kilea Mccarey [place] Thompson, DNP [facility] [address] [place], [place] [address] [phone]  Note: This document was prepared by [employer] voice dictation technology and any errors that results from this process are unintentional.

## 2024-05-27 ENCOUNTER — Ambulatory Visit: Admitting: Family Medicine

## 2024-06-16 ENCOUNTER — Other Ambulatory Visit: Payer: Self-pay | Admitting: Family Medicine

## 2024-06-16 DIAGNOSIS — Z1231 Encounter for screening mammogram for malignant neoplasm of breast: Secondary | ICD-10-CM

## 2024-06-25 ENCOUNTER — Inpatient Hospital Stay (HOSPITAL_BASED_OUTPATIENT_CLINIC_OR_DEPARTMENT_OTHER): Admission: RE | Admit: 2024-06-25 | Source: Ambulatory Visit | Admitting: Radiology

## 2024-06-25 DIAGNOSIS — Z1231 Encounter for screening mammogram for malignant neoplasm of breast: Secondary | ICD-10-CM

## 2024-07-16 ENCOUNTER — Encounter

## 2024-07-26 DIAGNOSIS — F902 Attention-deficit hyperactivity disorder, combined type: Secondary | ICD-10-CM | POA: Diagnosis not present

## 2024-07-26 DIAGNOSIS — F331 Major depressive disorder, recurrent, moderate: Secondary | ICD-10-CM | POA: Diagnosis not present

## 2024-07-26 DIAGNOSIS — F429 Obsessive-compulsive disorder, unspecified: Secondary | ICD-10-CM | POA: Diagnosis not present

## 2024-07-26 DIAGNOSIS — F411 Generalized anxiety disorder: Secondary | ICD-10-CM | POA: Diagnosis not present

## 2024-08-12 ENCOUNTER — Encounter: Payer: Self-pay | Admitting: Family Medicine

## 2024-08-12 ENCOUNTER — Ambulatory Visit: Admitting: Family Medicine

## 2024-08-12 VITALS — BP 111/72 | HR 94 | Temp 98.4°F | Ht 64.0 in | Wt 150.0 lb

## 2024-08-12 DIAGNOSIS — R630 Anorexia: Secondary | ICD-10-CM | POA: Diagnosis not present

## 2024-08-12 DIAGNOSIS — R110 Nausea: Secondary | ICD-10-CM | POA: Diagnosis not present

## 2024-08-12 DIAGNOSIS — R1319 Other dysphagia: Secondary | ICD-10-CM

## 2024-08-12 DIAGNOSIS — R6881 Early satiety: Secondary | ICD-10-CM | POA: Diagnosis not present

## 2024-08-12 DIAGNOSIS — R634 Abnormal weight loss: Secondary | ICD-10-CM | POA: Diagnosis not present

## 2024-08-12 DIAGNOSIS — E782 Mixed hyperlipidemia: Secondary | ICD-10-CM

## 2024-08-12 DIAGNOSIS — D171 Benign lipomatous neoplasm of skin and subcutaneous tissue of trunk: Secondary | ICD-10-CM

## 2024-08-12 LAB — LIPID PANEL

## 2024-08-12 MED ORDER — PANTOPRAZOLE SODIUM 40 MG PO TBEC
40.0000 mg | DELAYED_RELEASE_TABLET | Freq: Every day | ORAL | 3 refills | Status: AC
Start: 2024-08-12 — End: ?

## 2024-08-12 NOTE — Progress Notes (Signed)
 Acute Office Visit  Subjective:     Patient ID: [name], female    DOB: 1981/01/10, 43 y.o.   MRN: [id number]  Chief Complaint  Patient presents with   Anorexia    HPI  History of Present Illness   [name] is a 43 year old female who presents with unintentional weight loss and decreased appetite.  She has experienced unintentional weight loss of approximately 12 pounds over the past couple of months, accompanied by a significant decrease in appetite. She only desires to eat once a day and feels full after just a few bites. Occasional nausea occurs, particularly if she hasn't eaten by supper time, which subsides after eating a small amount. However, if she forces herself to eat more, she experiences nausea and gagging. No vomiting is reported, but she has persistent constipation, which is a chronic issue. She feels tired despite getting adequate sleep at night.  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 She also mentions experiencing occasional difficulty swallowing, which has been occurring randomly for about a year. She describes a sensation of food going down slowly and needing to pause to catch her breath. She has a history of acid reflux and was previously on Protonix, but she does not currently take any medication for it. No heartburn or indigestion is reported, but she has experienced episodes of vocal cord dysfunction in the past and esophageal irritation noted on prior scope. She also mentions experiencing palpitations when lying flat, describing them as her heart 'skipping a beat' and causing her to catch her breath.  She reports having bumps on her back that have been growing. Nontender but she does have chronic back pain.   Her family history includes a sister with similar gastrointestinal issues, including nausea, vomiting, and a possible diagnosis of  gastroparesis. Her sister also has a hernia and has undergone esophageal stretching procedures. Additionally, her sister recently had abnormal ferritin levels in her blood work.     She does have chronic left hip pain from a tear, otherwise denies myalgias. Denies night sweats, fevers. Has hx of anxiety and depression but feels like this is well managed. Denies increased stress recently.   She would like to have her cholesterol check today with her labs. She is fasting and has a history of high cholesterol.      08/12/2024    1:25 PM 12/07/2023   10:02 AM 06/26/2023    9:24 AM  Depression screen PHQ 2/9  Decreased Interest 0 0 0  Down, Depressed, Hopeless 1 0 0  PHQ - 2 Score 1 0 0  Altered sleeping 0  0  Tired, decreased energy 2  0  Change in appetite 3  1  Feeling bad or failure about yourself  0  0  Trouble concentrating 0  1  Moving slowly or fidgety/restless 0  1  Suicidal thoughts 0  0  PHQ-9 Score 6  3  Difficult doing work/chores Somewhat difficult  Not difficult at all      08/12/2024    1:26 PM 06/26/2023    9:24 AM 06/19/2023   10:41 AM 04/02/2023    8:24 AM  GAD 7 : Generalized Anxiety Score  Nervous, Anxious, on Edge 1 0 1 2  Control/stop worrying 1 0 1 3  Worry too much - different things 1 0 1 3  Trouble relaxing 1  0 1 2  Restless 1 0 1 2  Easily annoyed or irritable 1 0 1 3  Afraid - awful might happen 1 0 1 3  Total GAD 7 Score 7 0 7 18  Anxiety Difficulty Not difficult at all Not difficult at all  Very difficult     ROS Negative unless specially indicated above in HPI.      Objective:    BP 111/72   Pulse 94   Temp 98.4 F (36.9 C) (Temporal)   Ht 5' 4 (1.626 m)   Wt 150 lb (68 kg)   SpO2 100%   BMI 25.75 kg/m  Wt Readings from Last 3 Encounters:  08/12/24 150 lb (68 kg)  05/16/24 158 lb 3.2 oz (71.8 kg)  12/07/23 168 lb 9.6 oz (76.5 kg)      Physical Exam Vitals and nursing note reviewed.  Constitutional:      General: She is not in  acute distress.    Appearance: Normal appearance. She is not ill-appearing.  Neck:     Thyroid : No thyroid  mass, thyromegaly or thyroid  tenderness.  Cardiovascular:     Rate and Rhythm: Normal rate and regular rhythm.     Pulses: Normal pulses.     Heart sounds: Normal heart sounds. No murmur heard. Pulmonary:     Effort: Pulmonary effort is normal. No respiratory distress.     Breath sounds: Normal breath sounds.  Abdominal:     General: Bowel sounds are normal. There is no distension.     Palpations: Abdomen is soft. There is no mass.     Tenderness: There is no abdominal tenderness. There is no guarding or rebound.  Musculoskeletal:     Cervical back: Neck supple. No tenderness.     Right lower leg: No edema.     Left lower leg: No edema.  Lymphadenopathy:     Cervical: No cervical adenopathy.  Skin:    General: Skin is warm and dry.     Comments: 0.5 cm subcutaneous cyst noted to lower right back. No induration or fluctuance. No tenderness, warmth, or erythema.  Neurological:     General: No focal deficit present.     Mental Status: She is alert and oriented to person, place, and time.  Psychiatric:        Mood and Affect: Mood normal.        Behavior: Behavior normal.     No results found for any visits on 08/12/24.      Assessment & Plan:   [name] was seen today for anorexia.  Diagnoses and all orders for this visit:  Decreased appetite -     pantoprazole (PROTONIX) 40 MG tablet; Take 1 tablet (40 mg total) by mouth daily. -     Anemia Profile B -     Thyroid  Panel With TSH -     CMP14+EGFR -     Alpha-Gal Panel  Esophageal dysphagia -     pantoprazole (PROTONIX) 40 MG tablet; Take 1 tablet (40 mg total) by mouth daily.  Early satiety  Nausea -     pantoprazole (PROTONIX) 40 MG tablet; Take 1 tablet (40 mg total) by mouth daily. -     Anemia Profile B -     Thyroid  Panel With TSH -     CMP14+EGFR -     Alpha-Gal Panel  Weight loss -     Anemia  Profile B -     Thyroid  Panel With TSH -  CMP14+EGFR  Mixed hyperlipidemia -     Lipid panel  Lipoma of back  Assessment and Plan    Unintentional weight loss and decreased appetite Unintentional weight loss of 12 pounds with decreased appetite, nausea, and early satiety. - Order lab work: blood counts, iron panel, folate, vitamin B12, vitamin D , kidney and liver function tests, electrolytes, thyroid  function tests. - Check alpha-gal panel for potential allergic reactions.  Intermittent dysphagia and nausea Intermittent dysphagia with nausea possibly due to esophageal motility disorder or acid reflux. - Prescribe Protonix on an empty stomach, 30 minutes before eating, for two weeks. - Order anemia panel including ferritin. - Will discuss referral to GI if no improvement or worsening of symptoms.   Fatigue Fatigue despite adequate sleep, possibly due to decreased appetite, anemia, or thyroid  dysfunction. - Order lab work: anemia panel and thyroid  function tests.  Chronic constipation Chronic constipation with no recent changes in bowel habits. - Recommend Miralax  or stool softeners as needed.  Multiple subcutaneous lipomas, back Multiple benign subcutaneous lipomas on the back. - Monitor for changes in size, tenderness, or signs of infection.     Mixed hyperlipidemia Fasting lipid panel pending.   Return to office for new or worsening symptoms, or if symptoms persist.   The patient indicates understanding of these issues and agrees with the plan.  Annabella CHRISTELLA Search, FNP

## 2024-08-15 ENCOUNTER — Other Ambulatory Visit: Payer: Self-pay

## 2024-08-15 ENCOUNTER — Ambulatory Visit: Payer: Self-pay | Admitting: Family Medicine

## 2024-08-15 ENCOUNTER — Other Ambulatory Visit (HOSPITAL_COMMUNITY): Payer: Self-pay

## 2024-08-15 DIAGNOSIS — E538 Deficiency of other specified B group vitamins: Secondary | ICD-10-CM

## 2024-08-15 LAB — ANEMIA PROFILE B
Basophils Absolute: 0 x10E3/uL (ref 0.0–0.2)
Basos: 1 %
EOS (ABSOLUTE): 0.1 x10E3/uL (ref 0.0–0.4)
Eos: 3 %
Ferritin: 46 ng/mL (ref 15–150)
Folate: 2.9 ng/mL — ABNORMAL LOW (ref >3.0)
Hematocrit: 44.8 % (ref 34.0–46.6)
Hemoglobin: 14.8 g/dL (ref 11.1–15.9)
Immature Grans (Abs): 0 x10E3/uL (ref 0.0–0.1)
Immature Granulocytes: 0 %
Iron Saturation: 16 % (ref 15–55)
Iron: 53 ug/dL (ref 27–159)
Lymphocytes Absolute: 2 x10E3/uL (ref 0.7–3.1)
Lymphs: 39 %
MCH: 27.2 pg (ref 26.6–33.0)
MCHC: 33 g/dL (ref 31.5–35.7)
MCV: 82 fL (ref 79–97)
Monocytes Absolute: 0.3 x10E3/uL (ref 0.1–0.9)
Monocytes: 7 %
Neutrophils Absolute: 2.5 x10E3/uL (ref 1.4–7.0)
Neutrophils: 50 %
Platelets: 210 x10E3/uL (ref 150–450)
RBC: 5.44 x10E6/uL — ABNORMAL HIGH (ref 3.77–5.28)
RDW: 13.5 % (ref 11.7–15.4)
Retic Ct Pct: 0.6 % (ref 0.6–2.6)
Total Iron Binding Capacity: 331 ug/dL (ref 250–450)
UIBC: 278 ug/dL (ref 131–425)
Vitamin B-12: 310 pg/mL (ref 232–1245)
WBC: 5 x10E3/uL (ref 3.4–10.8)

## 2024-08-15 LAB — THYROID PANEL WITH TSH
Free Thyroxine Index: 2.6 (ref 1.2–4.9)
T3 Uptake Ratio: 34 % (ref 24–39)
T4, Total: 7.7 ug/dL (ref 4.5–12.0)
TSH: 1.06 u[IU]/mL (ref 0.450–4.500)

## 2024-08-15 LAB — CMP14+EGFR
ALT: 25 IU/L (ref 0–32)
AST: 25 IU/L (ref 0–40)
Albumin: 4.7 g/dL (ref 3.9–4.9)
Alkaline Phosphatase: 102 IU/L (ref 41–116)
BUN/Creatinine Ratio: 11 (ref 9–23)
BUN: 9 mg/dL (ref 6–24)
Bilirubin Total: 0.2 mg/dL (ref 0.0–1.2)
CO2: 25 mmol/L (ref 20–29)
Calcium: 9.4 mg/dL (ref 8.7–10.2)
Chloride: 103 mmol/L (ref 96–106)
Creatinine, Ser: 0.83 mg/dL (ref 0.57–1.00)
Globulin, Total: 2.1 g/dL (ref 1.5–4.5)
Glucose: 86 mg/dL (ref 70–99)
Potassium: 4.6 mmol/L (ref 3.5–5.2)
Sodium: 139 mmol/L (ref 134–144)
Total Protein: 6.8 g/dL (ref 6.0–8.5)
eGFR: 90 mL/min/1.73 (ref >59)

## 2024-08-15 LAB — ALPHA-GAL PANEL
Allergen Lamb IgE: <0.1 kU/L
Beef IgE: <0.1 kU/L
IgE (Immunoglobulin E), Serum: 19 [IU]/mL (ref 6–495)
O215-IgE Alpha-Gal: <0.1 kU/L
Pork IgE: <0.1 kU/L

## 2024-08-15 LAB — LIPID PANEL
Chol/HDL Ratio: 3 ratio (ref 0.0–4.4)
Cholesterol, Total: 145 mg/dL (ref 100–199)
HDL: 49 mg/dL (ref >39)
LDL Chol Calc (NIH): 74 mg/dL (ref 0–99)
Triglycerides: 124 mg/dL (ref 0–149)
VLDL Cholesterol Cal: 22 mg/dL (ref 5–40)

## 2024-08-15 MED ORDER — FOLIC ACID 1 MG PO TABS
1.0000 mg | ORAL_TABLET | Freq: Every day | ORAL | 3 refills | Status: AC
Start: 2024-08-15 — End: ?
  Filled 2024-08-15: qty 90, 90d supply, fill #0

## 2024-08-22 ENCOUNTER — Encounter: Payer: Self-pay | Admitting: Radiology

## 2024-08-24 ENCOUNTER — Other Ambulatory Visit: Payer: Self-pay

## 2024-08-26 ENCOUNTER — Ambulatory Visit: Payer: Self-pay

## 2024-08-26 ENCOUNTER — Ambulatory Visit: Admitting: Family Medicine

## 2024-08-26 ENCOUNTER — Encounter: Payer: Self-pay | Admitting: Family Medicine

## 2024-08-26 VITALS — BP 114/68 | HR 90 | Temp 98.8°F | Ht 64.0 in | Wt 146.6 lb

## 2024-08-26 DIAGNOSIS — R1319 Other dysphagia: Secondary | ICD-10-CM

## 2024-08-26 DIAGNOSIS — R634 Abnormal weight loss: Secondary | ICD-10-CM | POA: Diagnosis not present

## 2024-08-26 DIAGNOSIS — R52 Pain, unspecified: Secondary | ICD-10-CM | POA: Diagnosis not present

## 2024-08-26 DIAGNOSIS — J069 Acute upper respiratory infection, unspecified: Secondary | ICD-10-CM

## 2024-08-26 DIAGNOSIS — R6881 Early satiety: Secondary | ICD-10-CM | POA: Diagnosis not present

## 2024-08-26 DIAGNOSIS — R509 Fever, unspecified: Secondary | ICD-10-CM | POA: Diagnosis not present

## 2024-08-26 LAB — VERITOR SARS-COV-2 AND FLU A+B
BD Veritor SARS-CoV-2 Ag: NEGATIVE
Influenza A: NEGATIVE
Influenza B: NEGATIVE

## 2024-08-26 MED ORDER — AMOXICILLIN 875 MG PO TABS
875.0000 mg | ORAL_TABLET | Freq: Two times a day (BID) | ORAL | 0 refills | Status: AC
Start: 2024-08-26 — End: 2024-09-02

## 2024-08-26 NOTE — Telephone Encounter (Signed)
 FYI Only or Action Required?: FYI only for provider: appointment scheduled on 08/26/24.  Patient was last seen in primary care on 08/12/2024 by Joesph Annabella HERO, FNP.  Called Nurse Triage reporting Generalized Body Aches, Chills, and Nasal Congestion.  Symptoms began several days ago.  Interventions attempted: OTC medications: alkaselzer.  Symptoms are: gradually worsening.  Triage Disposition: See Physician Within 24 Hours  Patient/caregiver understands and will follow disposition?: Yes          Copied from CRM #[id number]. Topic: Clinical - Red Word Triage >> Aug 26, 2024  8:18 AM Farrel B wrote: [facility] that prompted transfer to Nurse Triage: Fever, chills, bodyaches, congestion. Reason for Disposition  [1] SEVERE sore throat AND [2] present > 24 hours  Answer Assessment - Initial Assessment Questions 1. ONSET: When did the nasal discharge start?      Monday 2. AMOUNT: How much discharge is there?      *No Answer* 3. COUGH: Do you have a cough? If Yes, ask: Describe the color of your mucus. (e.g., clear, white, yellow, green)     Green/yellow 4. RESPIRATORY DISTRESS: Describe your breathing.      denies 5. FEVER: Do you have a fever? If Yes, ask: What is your temperature, how was it measured, and when did it start?     100.5 6. SEVERITY: Overall, how bad are you feeling right now? (e.g., doesn't interfere with normal activities, staying home from school/work, staying in bed)      Staying home from work 7. OTHER SYMPTOMS: Do you have any other symptoms? (e.g., earache, mouth sores, sore throat, wheezing)     Congested, sore throat, stomach upset 8. PREGNANCY: Is there any chance you are pregnant? When was your last menstrual period?     N/a  Protocols used: Common Cold-A-AH

## 2024-08-26 NOTE — Progress Notes (Signed)
 Acute Office Visit  Subjective:     Patient ID: [name], female    DOB: 1981/07/03, 43 y.o.   MRN: [id number]  Chief Complaint  Patient presents with   Generalized Body Aches    HPI  History of Present Illness   [name] is a 43 year old female who presents with fever, chills, and body aches.  Fever and constitutional symptoms - Fever onset Monday, then resolved and returned Thursday - Maximum temperature 100.80F yesterday - Chills and body aches, yesterday and today - No headache  Respiratory symptoms - Cough present - No shortness of breath or wheezing  Upper respiratory and gastrointestinal symptoms - Sinus pressure and slight sore throat - Green mucus present - Nausea and diarrhea - No vomiting or ear pain  Response to treatment - Using Alka Seltzer, Nyquil, and ibuprofen  - Initial improvement with medications, no improvement yesterday - Symptoms unchanged today compared to yesterday   Dysphagia - Resolved with protonix - Still struggling with appetite. Feels full after a few bites and is unable to continue to eat - Has lost an additional 4 lbs.       ROS As per HPI.      Objective:    BP 114/68   Pulse 90   Temp 98.8 F (37.1 C) (Temporal)   Ht 5' 4 (1.626 m)   Wt 146 lb 9.6 oz (66.5 kg)   SpO2 99%   BMI 25.16 kg/m  Wt Readings from Last 3 Encounters:  08/26/24 146 lb 9.6 oz (66.5 kg)  08/12/24 150 lb (68 kg)  05/16/24 158 lb 3.2 oz (71.8 kg)      Physical Exam Vitals and nursing note reviewed.  Constitutional:      General: She is not in acute distress.    Appearance: She is ill-appearing. She is not toxic-appearing or diaphoretic.  HENT:     Right Ear: Tympanic membrane, ear canal and external ear normal.     Left Ear: Tympanic membrane, ear canal and external ear normal.     Nose: Congestion present.     Right Sinus: Maxillary sinus tenderness present. No frontal sinus tenderness.     Left Sinus: Maxillary sinus  tenderness present. No frontal sinus tenderness.     Mouth/Throat:     Mouth: Mucous membranes are moist.     Pharynx: Posterior oropharyngeal erythema present. No pharyngeal swelling, oropharyngeal exudate, uvula swelling or postnasal drip.     Tonsils: No tonsillar exudate or tonsillar abscesses. 1+ on the right. 1+ on the left.  Eyes:     General:        Right eye: No discharge.        Left eye: No discharge.     Conjunctiva/sclera: Conjunctivae normal.  Cardiovascular:     Rate and Rhythm: Normal rate and regular rhythm.     Heart sounds: Normal heart sounds. No murmur heard. Pulmonary:     Effort: Pulmonary effort is normal.     Breath sounds: Normal breath sounds.  Musculoskeletal:     Cervical back: Neck supple.     Right lower leg: No edema.     Left lower leg: No edema.  Lymphadenopathy:     Cervical: No cervical adenopathy.  Skin:    General: Skin is warm and dry.  Neurological:     General: No focal deficit present.     Mental Status: She is alert and oriented to person, place, and time.  Psychiatric:  Mood and Affect: Mood normal.        Behavior: Behavior normal.     No results found for any visits on 08/26/24.      Assessment & Plan:   Nocole was seen today for generalized body aches.  Diagnoses and all orders for this visit:  Fever, unspecified fever cause -     Veritor SARS-CoV-2 and Flu A+B -     amoxicillin  (AMOXIL ) 875 MG tablet; Take 1 tablet (875 mg total) by mouth 2 (two) times daily for 7 days.  Acute URI -     Veritor SARS-CoV-2 and Flu A+B -     amoxicillin  (AMOXIL ) 875 MG tablet; Take 1 tablet (875 mg total) by mouth 2 (two) times daily for 7 days.  Esophageal dysphagia  Weight loss -     Ambulatory referral to Gastroenterology  Early satiety -     Ambulatory referral to Gastroenterology   Assessment and Plan    Acute upper respiratory infection with cough, fever, body aches, nausea, and diarrhea Negative Covid and flu  today. Will treat empirically with amoxicillin  due to recurrent fever and maxillary tenderness. Continue symptomatic care.      Dysphagia Resolved with protonix  Weight loss  Early satiety Referral to GI for further evaluation.    Return to office for new or worsening symptoms, or if symptoms persist.   The patient indicates understanding of these issues and agrees with the plan.  Annabella CHRISTELLA Search, FNP

## 2024-08-26 NOTE — Telephone Encounter (Signed)
 Appt made.

## 2024-09-05 ENCOUNTER — Encounter (INDEPENDENT_AMBULATORY_CARE_PROVIDER_SITE_OTHER): Payer: Self-pay | Admitting: *Deleted

## 2024-09-23 ENCOUNTER — Telehealth (INDEPENDENT_AMBULATORY_CARE_PROVIDER_SITE_OTHER): Payer: Self-pay | Admitting: Gastroenterology

## 2024-09-23 ENCOUNTER — Ambulatory Visit (INDEPENDENT_AMBULATORY_CARE_PROVIDER_SITE_OTHER): Admitting: Gastroenterology

## 2024-09-23 ENCOUNTER — Encounter (INDEPENDENT_AMBULATORY_CARE_PROVIDER_SITE_OTHER): Payer: Self-pay | Admitting: Gastroenterology

## 2024-09-23 VITALS — BP 104/71 | HR 101 | Temp 97.3°F | Ht 64.5 in | Wt 149.2 lb

## 2024-09-23 DIAGNOSIS — R634 Abnormal weight loss: Secondary | ICD-10-CM | POA: Diagnosis not present

## 2024-09-23 DIAGNOSIS — R1310 Dysphagia, unspecified: Secondary | ICD-10-CM | POA: Diagnosis not present

## 2024-09-23 DIAGNOSIS — R6881 Early satiety: Secondary | ICD-10-CM | POA: Diagnosis not present

## 2024-09-23 DIAGNOSIS — K581 Irritable bowel syndrome with constipation: Secondary | ICD-10-CM | POA: Insufficient documentation

## 2024-09-23 DIAGNOSIS — K5904 Chronic idiopathic constipation: Secondary | ICD-10-CM | POA: Insufficient documentation

## 2024-09-23 DIAGNOSIS — K5900 Constipation, unspecified: Secondary | ICD-10-CM | POA: Diagnosis not present

## 2024-09-23 MED ORDER — LINACLOTIDE 145 MCG PO CAPS: 145.0000 ug | ORAL_CAPSULE | Freq: Every day | ORAL | Status: AC

## 2024-09-23 MED ORDER — LINACLOTIDE 145 MCG PO CAPS: 145.0000 ug | ORAL_CAPSULE | Freq: Every day | ORAL | 1 refills | Status: AC

## 2024-09-23 NOTE — H&P (View-Only) (Signed)
 Telesia Ates Faizan Nechama Escutia , M.D. Gastroenterology & Hepatology [facility] [address] [place], [place] [address] Primary Care Physician: Joesph Annabella HERO, FNP [address] [place] [address]  Chief Complaint: Unintentional weight loss and early satiety. ,  Dysphagia  History of Present Illness: [name] is a 43 y.o. female with migraines who presents for evaluation of unintentional weight loss, early satiety, dysphagia and severe constipation  Patient was last seen in 2023 by [facility] for nausea and elevated liver enzymes  Patient reports for past 1 year she has lost her appetite.  She would eat once a day only.  Patient barely drinks any fluids because she feels full.  In past 1 year she has lost significant amount of weight.  Patient reports daughter was diagnosed with colitis.  Patient reports postprandial abdominal cramps and discomfort under her abdomen which feels like  labour pain  Patient reports severe constipation with 1 bowel movement every week to week and a half on Bristol stool scale type I-IV  Last ZHI:wnwz Last Colonoscopy:none  FHx: Daughter had colitis Social: neg smoking, alcohol or illicit drug use Surgical: Appendectomy cholecystectomy  Labs from 07/2024 normal liver enzymes ferritin 46 Folate 2.9 (low)  Vitamin B12 310 (low)  Hemoglobin 14.8 platelet 210 TSH 1.06  Documented weight 2024 175lbs today 149 lbs .  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, nerve impingment    Bipolar 1 disorder, mixed (HCC)    Depression    Fibromyalgia    Hair pulling    HSIL on Pap smear of cervix 2013   GYN- [facility]   Migraines     Past Surgical History: Past Surgical History:  Procedure Laterality Date   APPENDECTOMY  2019   CHOLECYSTECTOMY     LAPAROSCOPIC APPENDECTOMY N/A 05/03/2018   Procedure: APPENDECTOMY  LAPAROSCOPIC;  Surgeon: Tanda Locus, MD;  Location: [facility] OR;  Service: General;  Laterality: N/A;   TUBAL LIGATION      Family History: Family History  Problem Relation Age of Onset   Heart disease Mother    COPD Mother    Lung cancer Mother    Osteoporosis Mother    Alcohol abuse Father    Bipolar disorder Father    Prostate cancer Father    Melanoma Paternal Grandmother    Arthritis/Rheumatoid Maternal Aunt     Social History: Social History   Tobacco Use  Smoking Status Some Days   Current packs/day: 0.00   Types: Cigarettes   Last attempt to quit: 06/08/2019   Years since quitting: 5.2   Passive exposure: Past  Smokeless Tobacco Never   Social History   Substance and Sexual Activity  Alcohol Use Yes   Alcohol/week: 0.0 standard drinks of alcohol   Comment: occ   Social History   Substance and Sexual Activity  Drug Use No    Allergies: No Known Allergies  Medications: Current Outpatient Medications  Medication Sig Dispense Refill   ALPRAZolam  (XANAX ) 0.5 MG tablet Take 0.25-0.5 mg by mouth daily as needed.     amphetamine -dextroamphetamine  (ADDERALL) 30 MG tablet Take 1 tablet by mouth twice daily for ADHD 60 tablet 0   amphetamine -dextroamphetamine  (ADDERALL) 30 MG tablet Take 1 tablet by mouth twice daily for ADHD 60 tablet 0   amphetamine -dextroamphetamine  (ADDERALL) 30 MG tablet Take 1 tablet by mouth twice daily for ADHD  60 tablet 0   ARIPiprazole  (ABILIFY ) 5 MG tablet Take 5 mg by mouth daily.     folic acid  (FOLVITE ) 1 MG tablet Take 1 tablet (1 mg total) by mouth daily. 90 tablet 3   NONFORMULARY OR COMPOUNDED ITEM Take 1 mg by mouth at bedtime. Low dose naltrexone 90 each 3   pantoprazole  (PROTONIX ) 40 MG tablet Take 1 tablet (40 mg total) by mouth daily. 30 tablet 3   SUMAtriptan  (IMITREX ) 100 MG tablet Take one tablet by mouth at onset of migraine. May repeat in 2 hours if headache persists or recurs. 10 tablet 11   Turmeric 500 MG CAPS Take by  mouth.     cyclobenzaprine  (FLEXERIL ) 10 MG tablet Take 1 tablet (10 mg total) by mouth 3 (three) times daily as needed for muscle spasms. (Patient not taking: Reported on 09/23/2024) 30 tablet 0   No current facility-administered medications for this visit.    Review of Systems: GENERAL: negative for malaise, night sweats HEENT: No changes in hearing or vision, no nose bleeds or other nasal problems. NECK: Negative for lumps, goiter, pain and significant neck swelling RESPIRATORY: Negative for cough, wheezing CARDIOVASCULAR: Negative for chest pain, leg swelling, palpitations, orthopnea GI: SEE HPI MUSCULOSKELETAL: Negative for joint pain or swelling, back pain, and muscle pain. SKIN: Negative for lesions, rash HEMATOLOGY Negative for prolonged bleeding, bruising easily, and swollen nodes. ENDOCRINE: Negative for cold or heat intolerance, polyuria, polydipsia and goiter. NEURO: negative for tremor, gait imbalance, syncope and seizures. The remainder of the review of systems is noncontributory.   Physical Exam: BP 104/71   Pulse (!) 101   Temp (!) 97.3 F (36.3 C)   Ht 5' 4.5 (1.638 m)   Wt 149 lb 3.2 oz (67.7 kg)   LMP 09/03/2024 (Approximate)   BMI 25.21 kg/m  GENERAL: The patient is AO x3, in no acute distress. HEENT: Head is normocephalic and atraumatic. EOMI are intact. Mouth is well hydrated and without lesions. NECK: Supple. No masses LUNGS: Clear to auscultation. No presence of rhonchi/wheezing/rales. Adequate chest expansion HEART: RRR, normal s1 and s2. ABDOMEN: Soft, nontender, no guarding, no peritoneal signs, and nondistended. BS +. No masses.  Imaging/Labs: as above     Latest Ref Rng & Units 08/12/2024    2:06 PM 04/02/2023    8:52 AM 09/29/2022    8:38 AM  CBC  WBC 3.4 - 10.8 x10E3/uL 5.0  4.6  5.8   Hemoglobin 11.1 - 15.9 g/dL 85.1  85.9  85.8   Hematocrit 34.0 - 46.6 % 44.8  42.8  43.5   Platelets 150 - 450 x10E3/uL 210  254  237    Lab Results   Component Value Date   IRON 53 08/12/2024   TIBC 331 08/12/2024   FERRITIN 46 08/12/2024    I personally reviewed and interpreted the available labs, imaging and endoscopic files.   Negative alpha gal   ultrasound 2023   IMPRESSION: Status post cholecystectomy. Mildly increased echogenicity of hepatic parenchyma is noted suggesting possible hepatic steatosis.  Impression and Plan:  NIRA VISSCHER is a 43 y.o. female with migraines who presents for evaluation of unintentional weight loss, early satiety, dysphagia and severe constipation  # Early satiety # Unintentional weight loss #Intermittent dysphagia  Documented weight 2024 175lbs today 149 lbs .  Hence patient had at least 26 pound weight loss in the past year  Labs with some nutritional deficiencies with vitamin B-12 and folate deficiency  This could be  gastroparesis, intolerance to food/allergies  Patient has lower abdominal pain and daughter was recently diagnosed with colitis  CT 2021 was negative  Given significant symptoms and weight loss recommend further workup with upper endoscopy/dilation and colonoscopy  Gastric emptying study to assess for gastroparesis  Start folate and vitamin B12  Will obtain celiac panel with future blood work  Advised patient to keep a food diary.  As previous lab work with significant food allergies  #Severe constipation  Patient has a bowel movement every week and a half.  This is likely low fluid intake , need to rule out defecatory disorder  Ensure adequate fluid intake: Aim for 8 glasses of water  daily. Follow a high fiber diet: Include foods such as dates, prunes, pears, and kiwi. Take Miralax  twice a day for the first week, then reduce to once daily thereafter. Use Metamucil twice a day. Start Linzess  145 mcg and uptitrate as needed  All questions were answered.      Jolan Mealor Faizan Hale Chalfin, MD Gastroenterology and Hepatology [facility]  Gastroenterology   This chart has been completed using [facility] Dictation software, and while attempts have been made to ensure accuracy , certain words and phrases may not be transcribed as intended

## 2024-09-23 NOTE — Telephone Encounter (Signed)
 Medication Samples have been provided to the patient.  Drug name: Linzess        Strength:        Qty: 2 boxes   LOT: [id number]  Exp.Date: 03/26  Dosing instructions: take one capsule po daily  The patient has been instructed regarding the correct time, dose, and frequency of taking this medication, including desired effects and most common side effects.   [name] 10:07 AM 09/23/2024

## 2024-09-23 NOTE — Addendum Note (Signed)
 Addended by: Elanda Garmany on: 09/23/2024 10:08 AM   Modules accepted: Orders

## 2024-09-23 NOTE — Progress Notes (Signed)
 Telesia Ates Faizan Nechama Escutia , M.D. Gastroenterology & Hepatology [facility] [address] [place], [place] [address] Primary Care Physician: Joesph Annabella HERO, FNP [address] [place] [address]  Chief Complaint: Unintentional weight loss and early satiety. ,  Dysphagia  History of Present Illness: [name] is a 43 y.o. female with migraines who presents for evaluation of unintentional weight loss, early satiety, dysphagia and severe constipation  Patient was last seen in 2023 by [facility] for nausea and elevated liver enzymes  Patient reports for past 1 year she has lost her appetite.  She would eat once a day only.  Patient barely drinks any fluids because she feels full.  In past 1 year she has lost significant amount of weight.  Patient reports daughter was diagnosed with colitis.  Patient reports postprandial abdominal cramps and discomfort under her abdomen which feels like  labour pain  Patient reports severe constipation with 1 bowel movement every week to week and a half on Bristol stool scale type I-IV  Last ZHI:wnwz Last Colonoscopy:none  FHx: Daughter had colitis Social: neg smoking, alcohol or illicit drug use Surgical: Appendectomy cholecystectomy  Labs from 07/2024 normal liver enzymes ferritin 46 Folate 2.9 (low)  Vitamin B12 310 (low)  Hemoglobin 14.8 platelet 210 TSH 1.06  Documented weight 2024 175lbs today 149 lbs .  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, nerve impingment    Bipolar 1 disorder, mixed (HCC)    Depression    Fibromyalgia    Hair pulling    HSIL on Pap smear of cervix 2013   GYN- [facility]   Migraines     Past Surgical History: Past Surgical History:  Procedure Laterality Date   APPENDECTOMY  2019   CHOLECYSTECTOMY     LAPAROSCOPIC APPENDECTOMY N/A 05/03/2018   Procedure: APPENDECTOMY  LAPAROSCOPIC;  Surgeon: Tanda Locus, MD;  Location: [facility] OR;  Service: General;  Laterality: N/A;   TUBAL LIGATION      Family History: Family History  Problem Relation Age of Onset   Heart disease Mother    COPD Mother    Lung cancer Mother    Osteoporosis Mother    Alcohol abuse Father    Bipolar disorder Father    Prostate cancer Father    Melanoma Paternal Grandmother    Arthritis/Rheumatoid Maternal Aunt     Social History: Social History   Tobacco Use  Smoking Status Some Days   Current packs/day: 0.00   Types: Cigarettes   Last attempt to quit: 06/08/2019   Years since quitting: 5.2   Passive exposure: Past  Smokeless Tobacco Never   Social History   Substance and Sexual Activity  Alcohol Use Yes   Alcohol/week: 0.0 standard drinks of alcohol   Comment: occ   Social History   Substance and Sexual Activity  Drug Use No    Allergies: No Known Allergies  Medications: Current Outpatient Medications  Medication Sig Dispense Refill   ALPRAZolam  (XANAX ) 0.5 MG tablet Take 0.25-0.5 mg by mouth daily as needed.     amphetamine -dextroamphetamine  (ADDERALL) 30 MG tablet Take 1 tablet by mouth twice daily for ADHD 60 tablet 0   amphetamine -dextroamphetamine  (ADDERALL) 30 MG tablet Take 1 tablet by mouth twice daily for ADHD 60 tablet 0   amphetamine -dextroamphetamine  (ADDERALL) 30 MG tablet Take 1 tablet by mouth twice daily for ADHD  60 tablet 0   ARIPiprazole  (ABILIFY ) 5 MG tablet Take 5 mg by mouth daily.     folic acid  (FOLVITE ) 1 MG tablet Take 1 tablet (1 mg total) by mouth daily. 90 tablet 3   NONFORMULARY OR COMPOUNDED ITEM Take 1 mg by mouth at bedtime. Low dose naltrexone 90 each 3   pantoprazole  (PROTONIX ) 40 MG tablet Take 1 tablet (40 mg total) by mouth daily. 30 tablet 3   SUMAtriptan  (IMITREX ) 100 MG tablet Take one tablet by mouth at onset of migraine. May repeat in 2 hours if headache persists or recurs. 10 tablet 11   Turmeric 500 MG CAPS Take by  mouth.     cyclobenzaprine  (FLEXERIL ) 10 MG tablet Take 1 tablet (10 mg total) by mouth 3 (three) times daily as needed for muscle spasms. (Patient not taking: Reported on 09/23/2024) 30 tablet 0   No current facility-administered medications for this visit.    Review of Systems: GENERAL: negative for malaise, night sweats HEENT: No changes in hearing or vision, no nose bleeds or other nasal problems. NECK: Negative for lumps, goiter, pain and significant neck swelling RESPIRATORY: Negative for cough, wheezing CARDIOVASCULAR: Negative for chest pain, leg swelling, palpitations, orthopnea GI: SEE HPI MUSCULOSKELETAL: Negative for joint pain or swelling, back pain, and muscle pain. SKIN: Negative for lesions, rash HEMATOLOGY Negative for prolonged bleeding, bruising easily, and swollen nodes. ENDOCRINE: Negative for cold or heat intolerance, polyuria, polydipsia and goiter. NEURO: negative for tremor, gait imbalance, syncope and seizures. The remainder of the review of systems is noncontributory.   Physical Exam: BP 104/71   Pulse (!) 101   Temp (!) 97.3 F (36.3 C)   Ht 5' 4.5 (1.638 m)   Wt 149 lb 3.2 oz (67.7 kg)   LMP 09/03/2024 (Approximate)   BMI 25.21 kg/m  GENERAL: The patient is AO x3, in no acute distress. HEENT: Head is normocephalic and atraumatic. EOMI are intact. Mouth is well hydrated and without lesions. NECK: Supple. No masses LUNGS: Clear to auscultation. No presence of rhonchi/wheezing/rales. Adequate chest expansion HEART: RRR, normal s1 and s2. ABDOMEN: Soft, nontender, no guarding, no peritoneal signs, and nondistended. BS +. No masses.  Imaging/Labs: as above     Latest Ref Rng & Units 08/12/2024    2:06 PM 04/02/2023    8:52 AM 09/29/2022    8:38 AM  CBC  WBC 3.4 - 10.8 x10E3/uL 5.0  4.6  5.8   Hemoglobin 11.1 - 15.9 g/dL 85.1  85.9  85.8   Hematocrit 34.0 - 46.6 % 44.8  42.8  43.5   Platelets 150 - 450 x10E3/uL 210  254  237    Lab Results   Component Value Date   IRON 53 08/12/2024   TIBC 331 08/12/2024   FERRITIN 46 08/12/2024    I personally reviewed and interpreted the available labs, imaging and endoscopic files.   Negative alpha gal   ultrasound 2023   IMPRESSION: Status post cholecystectomy. Mildly increased echogenicity of hepatic parenchyma is noted suggesting possible hepatic steatosis.  Impression and Plan:  [name] is a 43 y.o. female with migraines who presents for evaluation of unintentional weight loss, early satiety, dysphagia and severe constipation  # Early satiety # Unintentional weight loss #Intermittent dysphagia  Documented weight 2024 175lbs today 149 lbs .  Hence patient had at least 26 pound weight loss in the past year  Labs with some nutritional deficiencies with vitamin B-12 and folate deficiency  This could be  gastroparesis, intolerance to food/allergies  Patient has lower abdominal pain and daughter was recently diagnosed with colitis  CT 2021 was negative  Given significant symptoms and weight loss recommend further workup with upper endoscopy/dilation and colonoscopy  Gastric emptying study to assess for gastroparesis  Start folate and vitamin B12  Will obtain celiac panel with future blood work  Advised patient to keep a food diary.  As previous lab work with significant food allergies  #Severe constipation  Patient has a bowel movement every week and a half.  This is likely low fluid intake , need to rule out defecatory disorder  Ensure adequate fluid intake: Aim for 8 glasses of water  daily. Follow a high fiber diet: Include foods such as dates, prunes, pears, and kiwi. Take Miralax  twice a day for the first week, then reduce to once daily thereafter. Use Metamucil twice a day. Start Linzess  145 mcg and uptitrate as needed  All questions were answered.      Angel Mealor Faizan Hale Chalfin, MD Gastroenterology and Hepatology [facility]  Gastroenterology   This chart has been completed using [facility] Dictation software, and while attempts have been made to ensure accuracy , certain words and phrases may not be transcribed as intended

## 2024-09-23 NOTE — Patient Instructions (Addendum)
  It was very nice to meet you today, as dicussed with will plan for the following :  1) upper endoscopy and colonoscopy  2)Gastric emptying study  3) Take folate and Vitamin b12 daily  4) Ensure adequate fluid intake: Aim for 8 glasses of water  daily. Follow a high fiber diet: Include foods such as dates, prunes, pears, and kiwi. Use Metamucil twice a day. Linzess   Linzess  works best when taken once a day every day, on an empty stomach, at least 30 minutes before your first meal of the day.  When Linzess  is taken daily as directed:  *Constipation relief is typically felt in about a week *IBS-C patients may begin to experience relief from belly pain and overall abdominal symptoms (pain, discomfort, and bloating) in about 1 week,   with symptoms typically improving over 12 weeks.  Diarrhea, nausea or abdominal cramping may occur in the first 2 weeks -keep taking it.  These symptoms should eventually resolve. Please notify us  if having more than 4 watery bowel movements per day or fecal soiling accidents.

## 2024-09-26 ENCOUNTER — Telehealth (INDEPENDENT_AMBULATORY_CARE_PROVIDER_SITE_OTHER): Payer: Self-pay

## 2024-09-26 NOTE — Telephone Encounter (Signed)
 ATC pt to give gastric emptying appointment, no answer. Lvm with appt details and sending mychart message.

## 2024-09-30 ENCOUNTER — Ambulatory Visit (HOSPITAL_COMMUNITY): Admission: RE | Admit: 2024-09-30 | Discharge: 2024-09-30 | Attending: Gastroenterology

## 2024-09-30 DIAGNOSIS — R6881 Early satiety: Secondary | ICD-10-CM

## 2024-09-30 DIAGNOSIS — Z0389 Encounter for observation for other suspected diseases and conditions ruled out: Secondary | ICD-10-CM | POA: Diagnosis not present

## 2024-09-30 MED ORDER — TECHNETIUM TC 99M SULFUR COLLOID
2.0000 | Freq: Once | INTRAVENOUS | Status: AC | PRN
  Administered 2024-09-30: 1.8 via INTRAVENOUS

## 2024-10-03 ENCOUNTER — Ambulatory Visit (INDEPENDENT_AMBULATORY_CARE_PROVIDER_SITE_OTHER): Payer: Self-pay | Admitting: Gastroenterology

## 2024-10-03 ENCOUNTER — Telehealth (INDEPENDENT_AMBULATORY_CARE_PROVIDER_SITE_OTHER): Payer: Self-pay

## 2024-10-03 MED ORDER — PEG 3350-KCL-NA BICARB-NACL 420 G PO SOLR: 4000.0000 mL | Freq: Once | ORAL | 0 refills | Status: AC

## 2024-10-03 NOTE — Telephone Encounter (Signed)
 Spoke with patient, scheduled TCS/EGD for 10/21/2024 at 8:15am. Rx sent to pharmacy. Instructions sent to mychart.

## 2024-10-03 NOTE — Telephone Encounter (Signed)
 Prior Auth not required on [employer] for TCS/EGD

## 2024-10-08 ENCOUNTER — Encounter (INDEPENDENT_AMBULATORY_CARE_PROVIDER_SITE_OTHER): Payer: Self-pay | Admitting: Gastroenterology

## 2024-10-14 ENCOUNTER — Encounter (HOSPITAL_BASED_OUTPATIENT_CLINIC_OR_DEPARTMENT_OTHER): Payer: Self-pay

## 2024-10-14 ENCOUNTER — Other Ambulatory Visit: Payer: Self-pay

## 2024-10-14 ENCOUNTER — Emergency Department (HOSPITAL_BASED_OUTPATIENT_CLINIC_OR_DEPARTMENT_OTHER)
Admission: EM | Admit: 2024-10-14 | Discharge: 2024-10-14 | Disposition: A | Discharge status: Home / Self Care | Attending: Emergency Medicine | Admitting: Emergency Medicine

## 2024-10-14 DIAGNOSIS — I2510 Atherosclerotic heart disease of native coronary artery without angina pectoris: Secondary | ICD-10-CM | POA: Diagnosis not present

## 2024-10-14 DIAGNOSIS — R5383 Other fatigue: Secondary | ICD-10-CM | POA: Diagnosis not present

## 2024-10-14 DIAGNOSIS — R638 Other symptoms and signs concerning food and fluid intake: Secondary | ICD-10-CM | POA: Diagnosis present

## 2024-10-14 DIAGNOSIS — E860 Dehydration: Secondary | ICD-10-CM | POA: Diagnosis not present

## 2024-10-14 LAB — COMPREHENSIVE METABOLIC PANEL WITH GFR
ALT: 32 U/L (ref 0–44)
AST: 30 U/L (ref 15–41)
Albumin: 4.4 g/dL (ref 3.5–5.0)
Alkaline Phosphatase: 82 U/L (ref 38–126)
Anion gap: 12 (ref 5–15)
BUN: 14 mg/dL (ref 6–20)
CO2: 22 mmol/L (ref 22–32)
Calcium: 9.8 mg/dL (ref 8.9–10.3)
Chloride: 106 mmol/L (ref 98–111)
Creatinine, Ser: 0.75 mg/dL (ref 0.44–1.00)
GFR, Estimated: >60 mL/min
Glucose, Bld: 106 mg/dL — ABNORMAL HIGH (ref 70–99)
Potassium: 3.6 mmol/L (ref 3.5–5.1)
Sodium: 139 mmol/L (ref 135–145)
Total Bilirubin: 0.9 mg/dL (ref 0.0–1.2)
Total Protein: 7.6 g/dL (ref 6.5–8.1)

## 2024-10-14 LAB — URINALYSIS, ROUTINE W REFLEX MICROSCOPIC
Bilirubin Urine: NEGATIVE
Glucose, UA: NEGATIVE mg/dL
Hgb urine dipstick: NEGATIVE
Ketones, ur: 15 mg/dL — AB
Nitrite: NEGATIVE
Protein, ur: 30 mg/dL — AB
Specific Gravity, Urine: 1.027 (ref 1.005–1.030)
pH: 5.5 (ref 5.0–8.0)

## 2024-10-14 LAB — CBC
HCT: 41.9 % (ref 36.0–46.0)
Hemoglobin: 14.3 g/dL (ref 12.0–15.0)
MCH: 27 pg (ref 26.0–34.0)
MCHC: 34.1 g/dL (ref 30.0–36.0)
MCV: 79.1 fL — ABNORMAL LOW (ref 80.0–100.0)
Platelets: 184 K/uL (ref 150–400)
RBC: 5.3 MIL/uL — ABNORMAL HIGH (ref 3.87–5.11)
RDW: 13.5 % (ref 11.5–15.5)
WBC: 5.6 K/uL (ref 4.0–10.5)
nRBC: 0 % (ref 0.0–0.2)

## 2024-10-14 LAB — LIPASE, BLOOD: Lipase: 27 U/L (ref 11–51)

## 2024-10-14 LAB — PREGNANCY, URINE: Preg Test, Ur: NEGATIVE

## 2024-10-14 MED ORDER — SODIUM CHLORIDE 0.9 % IV BOLUS
1000.0000 mL | Freq: Once | INTRAVENOUS | Status: AC
  Administered 2024-10-14: 1000 mL via INTRAVENOUS

## 2024-10-14 NOTE — ED Provider Notes (Signed)
 " [place] EMERGENCY DEPARTMENT AT [facility] Provider Note   CSN: [id number] Arrival date & time: 10/14/24  9263     Patient presents with: Fatigue   [name] is a 43 y.o. female who presents to the emergency department with a chief complaint of fatigue and decreased p.o. intake.  Patient has been experiencing chronic fatigue as well as decreased p.o. intake for the last 8 to 10 months.  Patient has followed up outpatient with her primary care provider as well as GI.  Patient has most recently had a gastric emptying study earlier this month which was read as normal.  Patient is further following up with GI next week for a colonoscopy and endoscopy.  Patient states that her acute concern is that she was dehydrated from severe decreased p.o. intake over the past 2 weeks.  Patient states that she has intermittently been drinking sips of water  but has not been able to eat full meals due to her just not being hungry.  She states that even the smell of food just makes her not hungry.  Denies nausea, vomiting, diarrhea.  Denies acute fevers, however states that she has run a low-grade fever for years in the afternoons and that no one has ever been able to tell her what is going on.  Patient states that she has lost approximately 25 to 30 pounds over the last 8 to 10 months.  Denies chest pain, shortness of breath, abdominal pain, urinary symptoms.  Denies known sick contacts.  Denies cough, congestion, or acute infectious symptoms.  Past medical history significant for bipolar 1 disorder, migraines, ADD, hyperlipidemia, positive double-stranded DNA antibody test, myalgic encephalomyelitis/chronic fatigue syndrome, chronic idiopathic constipation, early CAD, fibromyalgia, etc. patient also states that 2 days ago she had got up out of the bed and was walking to her daughter's room whenever she had a syncopal episode.  Denies head or neck injury.  Patient states that she has undiagnosed POTS  and has had syncopal episodes like this for some time.   HPI     Prior to Admission medications  Medication Sig Start Date End Date Taking? Authorizing Provider  ALPRAZolam  (XANAX ) 0.5 MG tablet Take 0.25-0.5 mg by mouth daily as needed. 07/26/24   [provider]  amphetamine -dextroamphetamine  (ADDERALL) 30 MG tablet Take 1 tablet by mouth twice daily for ADHD 07/18/23     amphetamine -dextroamphetamine  (ADDERALL) 30 MG tablet Take 1 tablet by mouth twice daily for ADHD 08/16/23     amphetamine -dextroamphetamine  (ADDERALL) 30 MG tablet Take 1 tablet by mouth twice daily for ADHD 06/19/23     ARIPiprazole  (ABILIFY ) 5 MG tablet Take 5 mg by mouth daily. 02/01/24   [provider]  cyclobenzaprine  (FLEXERIL ) 10 MG tablet Take 1 tablet (10 mg total) by mouth 3 (three) times daily as needed for muscle spasms. Patient not taking: Reported on 09/23/2024 08/17/23   Moishe Chiquita HERO, NP  folic acid  (FOLVITE ) 1 MG tablet Take 1 tablet (1 mg total) by mouth daily. 08/15/24   Joesph Annabella HERO, FNP  linaclotide  (LINZESS ) 145 MCG CAPS capsule Take 1 capsule (145 mcg total) by mouth daily. 09/23/24 03/22/25  Cinderella [name], MD  linaclotide  (LINZESS ) 145 MCG CAPS capsule Take 1 capsule (145 mcg total) by mouth daily before breakfast. 09/23/24   Ahmed, [name], MD  NONFORMULARY OR COMPOUNDED ITEM Take 1 mg by mouth at bedtime. Low dose naltrexone 06/04/23   Raulkar, Sven SQUIBB, MD  pantoprazole  (PROTONIX ) 40 MG tablet  Take 1 tablet (40 mg total) by mouth daily. 08/12/24   Joesph Annabella HERO, FNP  SUMAtriptan  (IMITREX ) 100 MG tablet Take one tablet by mouth at onset of migraine. May repeat in 2 hours if headache persists or recurs. 05/02/24   Joesph Annabella HERO, FNP  Turmeric 500 MG CAPS Take by mouth.    [provider]    Allergies: Patient has no known allergies.    Review of Systems  Constitutional:  Positive for fatigue.    Updated Vital Signs BP 104/67   Pulse 89   Temp 98.6  F (37 C) (Oral)   Resp (!) 22   Ht 5' 4 (1.626 m)   Wt 63 kg   LMP 09/30/2024 (Exact Date)   SpO2 99%   BMI 23.86 kg/m   Physical Exam Vitals and nursing note reviewed.  Constitutional:      General: She is awake. She is not in acute distress.    Appearance: Normal appearance. She is not ill-appearing, toxic-appearing or diaphoretic.  HENT:     Head: Normocephalic and atraumatic.  Eyes:     General: No scleral icterus. Cardiovascular:     Rate and Rhythm: Normal rate and regular rhythm.  Pulmonary:     Effort: Pulmonary effort is normal. No respiratory distress.     Breath sounds: No wheezing, rhonchi or rales.  Abdominal:     General: Abdomen is flat. There is no distension.     Palpations: Abdomen is soft.     Tenderness: There is no abdominal tenderness. There is no right CVA tenderness, left CVA tenderness or guarding.  Musculoskeletal:        General: Normal range of motion.     Right lower leg: No edema.     Left lower leg: No edema.  Skin:    General: Skin is warm.     Capillary Refill: Capillary refill takes less than 2 seconds.  Neurological:     General: No focal deficit present.     Mental Status: She is alert and oriented to person, place, and time.     GCS: GCS eye subscore is 4. GCS verbal subscore is 5. GCS motor subscore is 6.     Sensory: Sensation is intact.     Motor: Motor function is intact.     Coordination: Coordination is intact.     Comments: No strength deficit of upper or lower extremities against gravity or resistance, equal grip strength bilaterally, coordination intact with finger to nose testing  Psychiatric:        Mood and Affect: Mood normal.        Behavior: Behavior normal. Behavior is cooperative.     (all labs ordered are listed, but only abnormal results are displayed) Labs Reviewed  URINE CULTURE - Abnormal; Notable for the following components:      Result Value   Culture   (*)    Value: <10,000 COLONIES/mL INSIGNIFICANT  GROWTH Performed at [facility], [address] [place], [place] [address]    All other components within normal limits  COMPREHENSIVE METABOLIC PANEL WITH GFR - Abnormal; Notable for the following components:   Glucose, Bld 106 (*)    All other components within normal limits  CBC - Abnormal; Notable for the following components:   RBC 5.30 (*)    MCV 79.1 (*)    All other components within normal limits  URINALYSIS, ROUTINE W REFLEX MICROSCOPIC - Abnormal; Notable for the following components:  APPearance HAZY (*)    Ketones, ur 15 (*)    Protein, ur 30 (*)    Leukocytes,Ua TRACE (*)    Bacteria, UA MANY (*)    All other components within normal limits  LIPASE, BLOOD  PREGNANCY, URINE    EKG: EKG Interpretation Date/Time:  Friday October 14 2024 09:21:09 EST Ventricular Rate:  80 PR Interval:  126 QRS Duration:  99 QT Interval:  391 QTC Calculation: 451 R Axis:   93  Text Interpretation: Sinus rhythm Borderline right axis deviation when compared top rior, similar appearance No STEMI Confirmed by Ginger Barefoot ([id number]) on 10/14/2024 9:37:19 AM  Radiology: No results found.   Procedures   Medications Ordered in the ED  sodium chloride  0.9 % bolus 1,000 mL (0 mLs Intravenous Stopped 10/14/24 1130)                                    Medical Decision Making Amount and/or Complexity of Data Reviewed Labs: ordered.   Patient presents to the ED for concern of fatigue, syncope, this involves an extensive number of treatment options, and is a complaint that carries with it a high risk of complications and morbidity.  The differential diagnosis includes dehydration, reduced p.o. intake, autoimmune process, celiac disorder, gastroparesis, chronic constipation, POTS, etc.   Co morbidities that complicate the patient evaluation  bipolar 1 disorder, migraines, ADD, hyperlipidemia, positive double-stranded DNA antibody test, myalgic encephalomyelitis/chronic  fatigue syndrome, chronic idiopathic constipation, early CAD, fibromyalgia   Additional history obtained:  Reviewed recent GI notes as well as primary care notes, patient is scheduled for an endoscopy and colonoscopy coming up next week, patient has previously had a gastric emptying study which was read as normal, patient is noted to have a B12 as well as folate deficiency, also diagnosed with other things like chronic constipation, etc. I can see documented in the GI note where patient has lost 25 to 30 pounds over the last 8 to 10 months.   Lab Tests:  I Ordered, and personally interpreted labs.  The pertinent results include: CBC unremarkable, CMP unremarkable, lipase not elevated, urinalysis does show ketones, protein, trace leuks, as well as many bacteria however 11-20 squamous epithelial cells, patient denies urinary symptoms but will send urine culture, pregnancy negative, orthostatic vitals do not show evidence of orthostatic hypotension   Medicines ordered and prescription drug management:  I ordered medication including fluids for dehydration Reevaluation of the patient after these medicines showed that the patient improved I have reviewed the patients home medicines and have made adjustments as needed   Test Considered:  Troponin, D-dimer: Shared decision making discussion with the patient, patient would like to hold off on further testing for syncope at this time, stating she has limited concern for cardiac or blood clot etiology of syncope at this time, I am okay with this as per the patient history this episode of syncope was not exertional and she has had previous episodes of syncope like this in the past   Critical Interventions:  None   Problem List / ED Course:  43 year old female who presents to the ED with a chief complaint of ongoing fatigue, reduced p.o. intake, has followed up outpatient with specialist as well as primary care On physical exam patient is  overall well-appearing with stable vital signs, denies acute symptoms or pain, no chest pain, shortness of breath, abdominal pain, nausea, vomiting,  or diarrhea, no acute infectious symptoms Labs ordered from triage are very reassuring, urine does show ketones as well as protein so we will give fluids for rehydration, patient denies urinary symptoms at this time, will culture urine however will not treat presumed asymptomatic bacteriuria, EKG reassuring Due to syncope will obtain orthostatic vital signs, shared decision making discussion about D-dimer as well as troponin, patient would like to hold off at this time, I am okay with this plan as syncopal episode does not appear exertional On reassessment patient feeling improved after fluids Orthostatics show no sign of orthostatic hypotension, EKG reassuring  Educated patient on workup and findings, all questions answered Return precautions given Patient discharged Unknown cause for fatigue at this time however patient has good follow-up outpatient with primary care as well as GI, improved symptomatically with fluids acutely today, through shared decision making held off on D-dimer as well as troponin   Reevaluation:  After the interventions noted above, I reevaluated the patient and found that they have :improved   Social Determinants of Health:  none   Dispostion:  After consideration of the diagnostic results and the patients response to treatment, I feel that the patient would benefit from discharge and outpatient therapies described, continued follow-up with specialist as well as primary care.     Final diagnoses:  Other fatigue  Dehydration    ED Discharge Orders     None          Janetta Terrall FALCON, [place] 10/15/24 1114    Tegeler, Lonni PARAS, MD 10/15/24 1607  "

## 2024-10-14 NOTE — Discharge Instructions (Signed)
 It was a pleasure taking care of you today.  Based on your history, physical exam, as well as labs I feel you are safe for discharge.  Today your workup was reassuring.  I do recommend that you continue to follow-up with GI as well as your other specialist and primary care provider.  Please go to your endoscopy as well as colonoscopy next week.  Please try to maintain good intake of fluids as well as solids whenever possible.  If you experience any the following symptoms including but not limited to fever, chills, chest pain, shortness of breath, unexplained weakness, dehydration, further syncope, or other concerning symptom please return to the emergency department or seek further medical care.  Please make your specialist as well as primary care provider aware of your workup and all findings today.  Recommend follow-up with your GI as scheduled next week.  Sooner if symptoms warrant.  If symptoms worsen recommend follow-up in 48 hours.

## 2024-10-14 NOTE — ED Notes (Signed)
 Pt d/c home per EDP order. Discharge summary reviewed, verbalize understanding. NAD , reports visitor is discharge ride home.

## 2024-10-14 NOTE — ED Triage Notes (Signed)
 Pt reports decreased appetite. Pt scheduled to have colonoscopy and endoscopy next week for chronic issues. Pt reports losing 30 lbs over the last 8-10 months. Pt denies any vomiting, just nausea when eating.

## 2024-10-15 LAB — URINE CULTURE: Culture: <10000 — AB

## 2024-10-21 ENCOUNTER — Ambulatory Visit (HOSPITAL_COMMUNITY)
Admission: RE | Admit: 2024-10-21 | Discharge: 2024-10-21 | Disposition: A | Discharge status: Home / Self Care | Attending: Gastroenterology | Admitting: Gastroenterology

## 2024-10-21 ENCOUNTER — Telehealth: Admitting: Physician Assistant

## 2024-10-21 ENCOUNTER — Ambulatory Visit (HOSPITAL_COMMUNITY)

## 2024-10-21 ENCOUNTER — Encounter (HOSPITAL_COMMUNITY): Payer: Self-pay | Admitting: Gastroenterology

## 2024-10-21 ENCOUNTER — Encounter (HOSPITAL_COMMUNITY)
Admission: RE | Disposition: A | Discharge status: Home / Self Care | Payer: Self-pay | Source: Home / Self Care | Attending: Gastroenterology

## 2024-10-21 ENCOUNTER — Other Ambulatory Visit: Payer: Self-pay

## 2024-10-21 DIAGNOSIS — F17210 Nicotine dependence, cigarettes, uncomplicated: Secondary | ICD-10-CM | POA: Insufficient documentation

## 2024-10-21 DIAGNOSIS — K3189 Other diseases of stomach and duodenum: Secondary | ICD-10-CM | POA: Diagnosis not present

## 2024-10-21 DIAGNOSIS — R1310 Dysphagia, unspecified: Secondary | ICD-10-CM | POA: Insufficient documentation

## 2024-10-21 DIAGNOSIS — K648 Other hemorrhoids: Secondary | ICD-10-CM | POA: Diagnosis not present

## 2024-10-21 DIAGNOSIS — M797 Fibromyalgia: Secondary | ICD-10-CM | POA: Diagnosis not present

## 2024-10-21 DIAGNOSIS — K449 Diaphragmatic hernia without obstruction or gangrene: Secondary | ICD-10-CM | POA: Insufficient documentation

## 2024-10-21 DIAGNOSIS — R634 Abnormal weight loss: Secondary | ICD-10-CM | POA: Diagnosis not present

## 2024-10-21 DIAGNOSIS — R21 Rash and other nonspecific skin eruption: Secondary | ICD-10-CM | POA: Diagnosis not present

## 2024-10-21 DIAGNOSIS — K2970 Gastritis, unspecified, without bleeding: Secondary | ICD-10-CM | POA: Diagnosis present

## 2024-10-21 DIAGNOSIS — K621 Rectal polyp: Secondary | ICD-10-CM | POA: Insufficient documentation

## 2024-10-21 DIAGNOSIS — R1319 Other dysphagia: Secondary | ICD-10-CM

## 2024-10-21 HISTORY — PX: ESOPHAGOGASTRODUODENOSCOPY: SHX5428

## 2024-10-21 HISTORY — PX: POLYPECTOMY: SHX149

## 2024-10-21 HISTORY — PX: ESOPHAGEAL DILATION: SHX303

## 2024-10-21 HISTORY — PX: COLONOSCOPY: SHX5424

## 2024-10-21 LAB — HM COLONOSCOPY

## 2024-10-21 SURGERY — COLONOSCOPY
Anesthesia: General

## 2024-10-21 MED ORDER — PROPOFOL 500 MG/50ML IV EMUL
INTRAVENOUS | Status: DC | PRN
  Administered 2024-10-21: 200 ug/kg/min via INTRAVENOUS

## 2024-10-21 MED ORDER — PROPOFOL 10 MG/ML IV BOLUS
INTRAVENOUS | Status: DC | PRN
  Administered 2024-10-21: 100 mg via INTRAVENOUS

## 2024-10-21 MED ORDER — PREDNISONE 10 MG (21) PO TBPK: ORAL_TABLET | ORAL | 0 refills | Status: AC

## 2024-10-21 MED ORDER — LIDOCAINE HCL (CARDIAC) PF 100 MG/5ML IV SOSY
PREFILLED_SYRINGE | INTRAVENOUS | Status: DC | PRN
  Administered 2024-10-21: 60 mg via INTRAVENOUS

## 2024-10-21 MED ORDER — LACTATED RINGERS IV SOLN: INTRAVENOUS | Status: DC

## 2024-10-21 MED ORDER — LACTATED RINGERS IV SOLN: INTRAVENOUS | Status: DC | PRN

## 2024-10-21 NOTE — Progress Notes (Signed)
 E Visit for Rash  We are sorry that you are not feeling well. Here is how we plan to help!  Based on what you shared with me it looks like you have contact dermatitis.  Contact dermatitis is a skin rash caused by something that touches the skin and causes irritation or inflammation.  Your skin may be red, swollen, dry, cracked, and itch.  The rash should go away in a few days but can last a few weeks.  If you get a rash, it's important to figure out what caused it so the irritant can be avoided in the future.  I have prescribed: Prednisone  10 mg daily for 6 days (see taper instructions below)  Directions for 6 day taper: Day 1: 2 tablets before breakfast, 1 after both lunch & dinner and 2 at bedtime Day 2: 1 tab before breakfast, 1 after both lunch & dinner and 2 at bedtime Day 3: 1 tab at each meal & 1 at bedtime Day 4: 1 tab at breakfast, 1 at lunch, 1 at bedtime Day 5: 1 tab at breakfast & 1 tab at bedtime Day 6: 1 tab at breakfast   HOME CARE:  Take cool showers and avoid direct sunlight. Apply cool compress or wet dressings. Take a bath in an oatmeal bath.  Sprinkle content of one Aveeno packet under running faucet with comfortably warm water.  Bathe for 15-20 minutes, 1-2 times daily.  Pat dry with a towel. Do not rub the rash. Use hydrocortisone  cream. Take an antihistamine like Benadryl for widespread rashes that itch.  The adult dose of Benadryl is 25-50 mg by mouth 4 times daily. Caution:  This type of medication may cause sleepiness.  Do not drink alcohol, drive, or operate dangerous machinery while taking antihistamines.  Do not take these medications if you have prostate enlargement.  Read package instructions thoroughly on all medications that you take.  GET HELP RIGHT AWAY IF:  Symptoms don't go away after treatment. Severe itching that persists. If you rash spreads or swells. If you rash begins to smell. If it blisters and opens or develops a yellow-brown crust. You  develop a fever. You have a sore throat. You become short of breath.  MAKE SURE YOU:  Understand these instructions. Will watch your condition. Will get help right away if you are not doing well or get worse.  Thank you for choosing an e-visit. Your e-visit answers were reviewed by a board certified advanced clinical practitioner to complete your personal care plan. Depending upon the condition, your plan could have included both over the counter or prescription medications. Please review your pharmacy choice. Be sure that the pharmacy you have chosen is open so that you can pick up your prescription now.  If there is a problem you may message your provider in MyChart to have the prescription routed to another pharmacy. Your safety is important to us . If you have drug allergies check your prescription carefully.  For the next 24 hours, you can use MyChart to ask questions about today's visit, request a non-urgent call back, or ask for a work or school excuse from your e-visit provider. You will get an email in the next two days asking about your experience. I hope that your e-visit has been valuable and will speed your recovery.  I have spent 5 minutes in review of e-visit questionnaire, review and updating patient chart, medical decision making and response to patient.   Delon CHRISTELLA Dickinson, PA-C

## 2024-10-21 NOTE — Discharge Instructions (Signed)

## 2024-10-21 NOTE — Anesthesia Preprocedure Evaluation (Addendum)
"                                    Anesthesia Evaluation  Patient identified by MRN, date of birth, ID band Patient awake    Reviewed: Allergy & Precautions, H&P , NPO status , Patient's Chart, lab work & pertinent test results  Airway Mallampati: II  TM Distance: >3 FB Neck ROM: Full    Dental no notable dental hx.    Pulmonary Current Smoker   Pulmonary exam normal breath sounds clear to auscultation       Cardiovascular negative cardio ROS Normal cardiovascular exam Rhythm:Regular Rate:Normal     Neuro/Psych  Headaches PSYCHIATRIC DISORDERS  Depression Bipolar Disorder   ADHD Took adderall yesterday  Neuromuscular disease    GI/Hepatic negative GI ROS, Neg liver ROS,,,  Endo/Other  negative endocrine ROS    Renal/GU negative Renal ROS  negative genitourinary   Musculoskeletal  (+)  Fibromyalgia -  Abdominal   Peds negative pediatric ROS (+)  Hematology negative hematology ROS (+)   Anesthesia Other Findings   Reproductive/Obstetrics negative OB ROS                              Anesthesia Physical Anesthesia Plan  ASA: 2  Anesthesia Plan: General   Post-op Pain Management:    Induction: Intravenous  PONV Risk Score and Plan:   Airway Management Planned: Nasal Cannula and Natural Airway  Additional Equipment:   Intra-op Plan:   Post-operative Plan:   Informed Consent: I have reviewed the patients History and Physical, chart, labs and discussed the procedure including the risks, benefits and alternatives for the proposed anesthesia with the patient or authorized representative who has indicated his/her understanding and acceptance.     Dental advisory given  Plan Discussed with: CRNA  Anesthesia Plan Comments: (Risks of adderall and anesthesia explained; the patient is willing to continue)         Anesthesia Quick Evaluation  "

## 2024-10-21 NOTE — Op Note (Signed)
 [facility] Patient Name: [name] Procedure Date: 10/21/2024 8:11 AM MRN: [id number] Date of Birth: 05-07-1981 Attending MD: Deatrice Dine , MD, [id number] CSN: [id number] Age: 44 Admit Type: Outpatient Procedure:                Colonoscopy Indications:              Weight loss Providers:                Deatrice Dine, MD, Rosina Sprague, Daphne Mulch                            Technician, Technician Referring MD:              Medicines:                Monitored Anesthesia Care Complications:            No immediate complications. Estimated Blood Loss:     Estimated blood loss was minimal. Procedure:                Pre-Anesthesia Assessment:                           - Prior to the procedure, a History and Physical                            was performed, and patient medications and                            allergies were reviewed. The patient's tolerance of                            previous anesthesia was also reviewed. The risks                            and benefits of the procedure and the sedation                            options and risks were discussed with the patient.                            All questions were answered, and informed consent                            was obtained. Prior Anticoagulants: The patient has                            taken no anticoagulant or antiplatelet agents. ASA                            Grade Assessment: II - A patient with mild systemic                            disease. After reviewing the risks and benefits,  the patient was deemed in satisfactory condition to                            undergo the procedure.                           After obtaining informed consent, the colonoscope                            was passed under direct vision. Throughout the                            procedure, the patient's blood pressure, pulse, and                            oxygen saturations were monitored  continuously. The                            CH-[id number] ([id number]) Colon was introduced through                            the anus and advanced to the the terminal ileum.                            The colonoscopy was performed without difficulty.                            The patient tolerated the procedure well. The                            quality of the bowel preparation was evaluated                            using the BBPS [facility] Bowel Preparation Scale)                            with scores of: Right Colon = 3, Transverse Colon =                            3 and Left Colon = 3 (entire mucosa seen well with                            no residual staining, small fragments of stool or                            opaque liquid). The total BBPS score equals 9. The                            terminal ileum, ileocecal valve, appendiceal                            orifice, and rectum were photographed. Scope In: 8:41:35 AM Scope Out: 8:54:24 AM Scope Withdrawal Time: 0 hours 10  minutes 50 seconds  Total Procedure Duration: 0 hours 12 minutes 49 seconds  Findings:      The perianal and digital rectal examinations were normal.      There is no endoscopic evidence of inflammation in the entire colon.       Biopsies for histology were taken with a cold forceps for evaluation of       microscopic colitis.      The terminal ileum appeared normal.      An 8 mm polyp was found in the rectum. The polyp was sessile. The polyp       was removed with a cold snare. Resection and retrieval were complete.      Non-bleeding internal hemorrhoids were found during retroflexion. The       hemorrhoids were small. Impression:               - The examined portion of the ileum was normal.                           - One 8 mm polyp in the rectum, removed with a cold                            snare. Resected and retrieved.                           - Non-bleeding internal hemorrhoids. Moderate Sedation:       Per Anesthesia Care      Per Anesthesia Care Recommendation:           - Patient has a contact number available for                            emergencies. The signs and symptoms of potential                            delayed complications were discussed with the                            patient. Return to normal activities tomorrow.                            Written discharge instructions were provided to the                            patient.                           - Resume previous diet.                           - Continue present medications.                           - Await pathology results.                           - Repeat colonoscopy for surveillance based on  pathology results.                           - Return to GI clinic as previously scheduled.                           -If biopsies are negative and patient continues to                            have weight loss than obtain cross sectional                            imaging of abdomen and pelvis Procedure Code(s):        --- Professional ---                           [id number], Colonoscopy, flexible; with removal of                            tumor(s), polyp(s), or other lesion(s) by snare                            technique                           45380, 59, Colonoscopy, flexible; with biopsy,                            single or multiple Diagnosis Code(s):        --- Professional ---                           K64.8, Other hemorrhoids                           D12.8, Benign neoplasm of rectum                           R63.4, Abnormal weight loss CPT copyright 2022 American Medical Association. All rights reserved. The codes documented in this report are preliminary and upon coder review may  be revised to meet current compliance requirements. Deatrice Dine, MD Deatrice Dine, MD 10/21/2024 8:58:59 AM This report has been signed electronically. Number of Addenda: 0

## 2024-10-21 NOTE — Transfer of Care (Signed)
 Immediate Anesthesia Transfer of Care Note  Patient: [name]  Procedure(s) Performed: COLONOSCOPY EGD (ESOPHAGOGASTRODUODENOSCOPY) DILATION, ESOPHAGUS POLYPECTOMY, INTESTINE  Patient Location: Endoscopy Unit  Anesthesia Type:General  Level of Consciousness: awake, alert , oriented, and patient cooperative  Airway & Oxygen Therapy: Patient Spontanous Breathing  Post-op Assessment: Report given to RN, Post -op Vital signs reviewed and stable, and Patient moving all extremities X 4  Post vital signs: Reviewed and stable  Last Vitals:  Vitals Value Taken Time  BP 90/62 10/21/24 08:59  Temp 36.7 C 10/21/24 08:59  Pulse 75 10/21/24 08:59  Resp 13 10/21/24 08:59  SpO2 98 % 10/21/24 08:59    Last Pain:  Vitals:   10/21/24 0859  TempSrc: Oral  PainSc: 0-No pain      Patients Stated Pain Goal: 6 (10/21/24 0711)  Complications: No notable events documented.

## 2024-10-21 NOTE — Op Note (Signed)
 [facility] Patient Name: [name] Procedure Date: 10/21/2024 8:18 AM MRN: [id number] Date of Birth: [birth year] Attending MD: Deatrice Dine , MD, [id number] CSN: [id number] Age: 44 Admit Type: Outpatient Procedure:                Upper GI endoscopy Indications:              Dysphagia, Weight loss Providers:                Deatrice Dine, MD, Rosina Sprague, Daphne Mulch                            Technician, Technician Referring MD:              Medicines:                Monitored Anesthesia Care Complications:            No immediate complications. Estimated Blood Loss:     Estimated blood loss was minimal. Procedure:                Pre-Anesthesia Assessment:                           - Prior to the procedure, a History and Physical                            was performed, and patient medications and                            allergies were reviewed. The patient's tolerance of                            previous anesthesia was also reviewed. The risks                            and benefits of the procedure and the sedation                            options and risks were discussed with the patient.                            All questions were answered, and informed consent                            was obtained. Prior Anticoagulants: The patient has                            taken no anticoagulant or antiplatelet agents. ASA                            Grade Assessment: II - A patient with mild systemic                            disease. After reviewing the risks and benefits,  the patient was deemed in satisfactory condition to                            undergo the procedure.                           After obtaining informed consent, the endoscope was                            passed under direct vision. Throughout the                            procedure, the patient's blood pressure, pulse, and                            oxygen saturations  were monitored continuously. The                            [id number] ([id number]) Upper was introduced through                            the mouth, and advanced to the second part of                            duodenum. The upper GI endoscopy was accomplished                            without difficulty. The patient tolerated the                            procedure well. Scope In: 8:27:41 AM Scope Out: 8:36:08 AM Total Procedure Duration: 0 hours 8 minutes 27 seconds  Findings:      No endoscopic abnormality was evident in the esophagus to explain the       patient's complaint of dysphagia. It was decided, however, to proceed       with dilation of the entire esophagus. A TTS dilator was passed through       the scope. Dilation with a 15-16.5-18 mm balloon dilator was performed       to 18 mm. The dilation site was examined following endoscope reinsertion       and showed mild mucosal disruption. Biopsies were obtained from the       proximal and distal esophagus with cold forceps for histology of       suspected eosinophilic esophagitis.      Esophagogastric landmarks were identified: the Z-line was found at 36 cm       and the site of hiatal narrowing was found at 39 cm from the incisors.      A 3 cm hiatal hernia was present.      The gastroesophageal flap valve was visualized endoscopically and       classified as Hill Grade III (minimal fold, loose to endoscope, hiatal       hernia likely).      Mildly erythematous mucosa without bleeding was found in the gastric       antrum. Biopsies were taken with a cold forceps for histology.  Nodular mucosa was found in the second portion of the duodenum. Biopsies       for histology were taken with a cold forceps for evaluation of celiac       disease. Impression:               - No endoscopic esophageal abnormality to explain                            patient's dysphagia. Esophagus dilated. Dilated.                           -  Esophagogastric landmarks identified.                           - 3 cm hiatal hernia.                           - Gastroesophageal flap valve classified as Hill                            Grade III (minimal fold, loose to endoscope, hiatal                            hernia likely).                           - Erythematous mucosa in the antrum. Biopsied.                           - Nodular mucosa in the second portion of the                            duodenum. Biopsied.                           - Biopsies were taken with a cold forceps for                            evaluation of eosinophilic esophagitis. Moderate Sedation:      Per Anesthesia Care Recommendation:           - Patient has a contact number available for                            emergencies. The signs and symptoms of potential                            delayed complications were discussed with the                            patient. Return to normal activities tomorrow.                            Written discharge instructions were provided to the  patient.                           - Resume previous diet.                           - Continue present medications.                           - Await pathology results.                           -Obtain celiac serologies Procedure Code(s):        --- Professional ---                           [phone], Esophagogastroduodenoscopy, flexible,                            transoral; with transendoscopic balloon dilation of                            esophagus (less than 30 mm diameter)                           43239, 59, Esophagogastroduodenoscopy, flexible,                            transoral; with biopsy, single or multiple Diagnosis Code(s):        --- Professional ---                           R13.10, Dysphagia, unspecified                           K44.9, Diaphragmatic hernia without obstruction or                            gangrene                            K31.89, Other diseases of stomach and duodenum                           R63.4, Abnormal weight loss CPT copyright 2022 [employer]. All rights reserved. The codes documented in this report are preliminary and upon coder review may  be revised to meet current compliance requirements. Deatrice Dine, MD Deatrice Dine, MD 10/21/2024 8:40:36 AM This report has been signed electronically. Number of Addenda: 0

## 2024-10-21 NOTE — Anesthesia Postprocedure Evaluation (Signed)
"   Anesthesia Post Note  Patient: [name]  Procedure(s) Performed: COLONOSCOPY EGD (ESOPHAGOGASTRODUODENOSCOPY) DILATION, ESOPHAGUS POLYPECTOMY, INTESTINE  Patient location during evaluation: PACU Anesthesia Type: General Level of consciousness: awake and alert Pain management: pain level controlled Vital Signs Assessment: post-procedure vital signs reviewed and stable Respiratory status: spontaneous breathing, nonlabored ventilation, respiratory function stable and patient connected to nasal cannula oxygen Cardiovascular status: blood pressure returned to baseline and stable Postop Assessment: no apparent nausea or vomiting Anesthetic complications: no   No notable events documented.   Last Vitals:  Vitals:   10/21/24 0859 10/21/24 0904  BP: 90/62 (!) 87/63  Pulse: 75 74  Resp: 13 20  Temp: 36.7 C   SpO2: 98% 100%    Last Pain:  Vitals:   10/21/24 0904  TempSrc:   PainSc: 0-No pain                 Andrea Limes      "

## 2024-10-21 NOTE — Interval H&P Note (Signed)
 History and Physical Interval Note:  10/21/2024 7:29 AM  [name]  has presented today for surgery, with the diagnosis of weight loss, dysphagia.  The various methods of treatment have been discussed with the patient and family. After consideration of risks, benefits and other options for treatment, the patient has consented to  Procedures with comments: COLONOSCOPY (N/A) - 8:15am, ASA any room EGD (ESOPHAGOGASTRODUODENOSCOPY) (N/A) as a surgical intervention.  The patient's history has been reviewed, patient examined, no change in status, stable for surgery.  I have reviewed the patient's chart and labs.  Questions were answered to the patient's satisfaction.     [name] Adael Culbreath

## 2024-10-24 ENCOUNTER — Ambulatory Visit (INDEPENDENT_AMBULATORY_CARE_PROVIDER_SITE_OTHER): Payer: Self-pay | Admitting: Gastroenterology

## 2024-10-24 ENCOUNTER — Encounter (HOSPITAL_COMMUNITY): Payer: Self-pay | Admitting: Gastroenterology

## 2024-10-24 ENCOUNTER — Encounter (INDEPENDENT_AMBULATORY_CARE_PROVIDER_SITE_OTHER): Payer: Self-pay | Admitting: *Deleted

## 2024-10-24 DIAGNOSIS — R1084 Generalized abdominal pain: Secondary | ICD-10-CM

## 2024-10-24 DIAGNOSIS — R634 Abnormal weight loss: Secondary | ICD-10-CM

## 2024-10-24 LAB — SURGICAL PATHOLOGY

## 2024-10-25 ENCOUNTER — Telehealth (INDEPENDENT_AMBULATORY_CARE_PROVIDER_SITE_OTHER): Payer: Self-pay

## 2024-10-25 ENCOUNTER — Encounter (INDEPENDENT_AMBULATORY_CARE_PROVIDER_SITE_OTHER): Payer: Self-pay | Admitting: *Deleted

## 2024-10-25 NOTE — Progress Notes (Signed)
 10 yr TCS noted in recall Patient result letter mailed Patient's PCP is on EPIC

## 2024-10-25 NOTE — Telephone Encounter (Signed)
 Hi Autumn,   Can you please schedule a CT Abdomen pelvis angio with and without contrast ? Dx: Abdominal pain , weight loss .  Thanks,  Veatrice Eckstein Faizan Jahshua Bonito, MD Gastroenterology and Hepatology [facility]

## 2024-10-25 NOTE — Telephone Encounter (Signed)
 Spoke with patient, informed her of CT appt. Patient aware and verbalized understanding.

## 2024-10-25 NOTE — Telephone Encounter (Signed)
 PA on [employer] for CT: This member does not require prior authorization for this procedure at this time from [employer]. The member's employer group has opted out for the requested procedure, making them non-delegated through [employer]. Please contact the health plan.

## 2024-10-26 ENCOUNTER — Other Ambulatory Visit: Payer: Self-pay | Admitting: *Deleted

## 2024-10-26 ENCOUNTER — Other Ambulatory Visit: Payer: Self-pay

## 2024-10-26 DIAGNOSIS — E538 Deficiency of other specified B group vitamins: Secondary | ICD-10-CM

## 2024-10-27 ENCOUNTER — Encounter (INDEPENDENT_AMBULATORY_CARE_PROVIDER_SITE_OTHER): Payer: Self-pay | Admitting: Gastroenterology

## 2024-10-28 ENCOUNTER — Other Ambulatory Visit (HOSPITAL_COMMUNITY): Payer: Self-pay

## 2024-10-28 ENCOUNTER — Ambulatory Visit: Payer: Self-pay | Admitting: Family Medicine

## 2024-10-28 LAB — ANEMIA PANEL
Ferritin: 92 ng/mL (ref 15–150)
Folate, Hemolysate: 386 ng/mL
Folate, RBC: 891 ng/mL
Hematocrit: 43.3 % (ref 34.0–46.6)
Iron Saturation: 18 % (ref 15–55)
Iron: 52 ug/dL (ref 27–159)
Retic Ct Pct: 0.7 % (ref 0.6–2.6)
Total Iron Binding Capacity: 287 ug/dL (ref 250–450)
UIBC: 235 ug/dL (ref 131–425)
Vitamin B-12: 541 pg/mL (ref 232–1245)

## 2024-10-28 MED ORDER — DICYCLOMINE HCL 20 MG PO TABS
10.0000 mg | ORAL_TABLET | Freq: Three times a day (TID) | ORAL | 3 refills | Status: AC
  Filled 2024-10-28: qty 45, 30d supply, fill #0

## 2024-11-03 ENCOUNTER — Other Ambulatory Visit (HOSPITAL_COMMUNITY): Payer: Self-pay

## 2024-11-03 ENCOUNTER — Telehealth: Admitting: Physician Assistant

## 2024-11-03 DIAGNOSIS — B9689 Other specified bacterial agents as the cause of diseases classified elsewhere: Secondary | ICD-10-CM

## 2024-11-03 DIAGNOSIS — J019 Acute sinusitis, unspecified: Secondary | ICD-10-CM | POA: Diagnosis not present

## 2024-11-03 MED ORDER — AMOXICILLIN-POT CLAVULANATE 875-125 MG PO TABS
1.0000 | ORAL_TABLET | Freq: Two times a day (BID) | ORAL | 0 refills | Status: AC
  Filled 2024-11-03 (×2): qty 14, 7d supply, fill #0

## 2024-11-03 NOTE — Progress Notes (Signed)

## 2024-11-09 ENCOUNTER — Other Ambulatory Visit (HOSPITAL_COMMUNITY): Payer: Self-pay | Admitting: Family Medicine

## 2024-11-09 DIAGNOSIS — Z1231 Encounter for screening mammogram for malignant neoplasm of breast: Secondary | ICD-10-CM

## 2024-11-11 ENCOUNTER — Ambulatory Visit (HOSPITAL_COMMUNITY)

## 2024-11-14 ENCOUNTER — Other Ambulatory Visit (HOSPITAL_COMMUNITY): Payer: Self-pay | Admitting: Family Medicine

## 2024-11-14 ENCOUNTER — Other Ambulatory Visit (HOSPITAL_COMMUNITY): Payer: Self-pay

## 2024-11-14 ENCOUNTER — Encounter: Admitting: Sports Medicine

## 2024-11-14 ENCOUNTER — Inpatient Hospital Stay (HOSPITAL_COMMUNITY): Admission: RE | Admit: 2024-11-14 | Source: Ambulatory Visit

## 2024-11-14 DIAGNOSIS — Z1231 Encounter for screening mammogram for malignant neoplasm of breast: Secondary | ICD-10-CM

## 2024-11-18 ENCOUNTER — Inpatient Hospital Stay (HOSPITAL_COMMUNITY): Admission: RE | Admit: 2024-11-18 | Source: Ambulatory Visit

## 2024-11-23 ENCOUNTER — Encounter: Admitting: Sports Medicine
